# Patient Record
Sex: Female | Born: 1970 | Race: White | Hispanic: No | Marital: Married | Smoking: Former smoker
Health system: Southern US, Community
[De-identification: ages and names within clinical notes are randomized; demographics above are authoritative.]

## PROBLEM LIST (undated history)

## (undated) DIAGNOSIS — N946 Dysmenorrhea, unspecified: Secondary | ICD-10-CM

## (undated) DIAGNOSIS — I10 Essential (primary) hypertension: Secondary | ICD-10-CM

## (undated) DIAGNOSIS — N939 Abnormal uterine and vaginal bleeding, unspecified: Secondary | ICD-10-CM

## (undated) DIAGNOSIS — N809 Endometriosis, unspecified: Secondary | ICD-10-CM

## (undated) DIAGNOSIS — D219 Benign neoplasm of connective and other soft tissue, unspecified: Secondary | ICD-10-CM

## (undated) DIAGNOSIS — E119 Type 2 diabetes mellitus without complications: Secondary | ICD-10-CM

## (undated) DIAGNOSIS — K219 Gastro-esophageal reflux disease without esophagitis: Secondary | ICD-10-CM

## (undated) DIAGNOSIS — D649 Anemia, unspecified: Secondary | ICD-10-CM

## (undated) DIAGNOSIS — A630 Anogenital (venereal) warts: Secondary | ICD-10-CM

## (undated) DIAGNOSIS — M5410 Radiculopathy, site unspecified: Secondary | ICD-10-CM

## (undated) DIAGNOSIS — M069 Rheumatoid arthritis, unspecified: Secondary | ICD-10-CM

## (undated) DIAGNOSIS — T8859XA Other complications of anesthesia, initial encounter: Secondary | ICD-10-CM

## (undated) DIAGNOSIS — Z5189 Encounter for other specified aftercare: Secondary | ICD-10-CM

## (undated) DIAGNOSIS — R112 Nausea with vomiting, unspecified: Secondary | ICD-10-CM

## (undated) DIAGNOSIS — Z9889 Other specified postprocedural states: Secondary | ICD-10-CM

## (undated) HISTORY — DX: Anogenital (venereal) warts: A63.0

## (undated) HISTORY — DX: Encounter for other specified aftercare: Z51.89

## (undated) HISTORY — DX: Radiculopathy, site unspecified: M54.10

## (undated) HISTORY — DX: Benign neoplasm of connective and other soft tissue, unspecified: D21.9

## (undated) HISTORY — DX: Rheumatoid arthritis, unspecified: M06.9

## (undated) HISTORY — DX: Dysmenorrhea, unspecified: N94.6

## (undated) HISTORY — DX: Endometriosis, unspecified: N80.9

## (undated) HISTORY — PX: PARATHYROIDECTOMY: SHX19

## (undated) HISTORY — DX: Abnormal uterine and vaginal bleeding, unspecified: N93.9

## (undated) HISTORY — DX: Type 2 diabetes mellitus without complications: E11.9

## (undated) HISTORY — DX: Anemia, unspecified: D64.9

## (undated) HISTORY — PX: ABDOMINAL HYSTERECTOMY: SHX81

---

## 2020-09-19 ENCOUNTER — Other Ambulatory Visit: Payer: Self-pay | Admitting: Physician Assistant

## 2020-09-19 ENCOUNTER — Other Ambulatory Visit: Payer: Self-pay

## 2020-09-19 ENCOUNTER — Ambulatory Visit
Admission: RE | Admit: 2020-09-19 | Discharge: 2020-09-19 | Disposition: A | Discharge status: Home / Self Care | Payer: Medicare Other | Source: Ambulatory Visit | Attending: Physician Assistant | Admitting: Physician Assistant

## 2020-09-19 DIAGNOSIS — M25562 Pain in left knee: Secondary | ICD-10-CM | POA: Diagnosis not present

## 2020-09-19 DIAGNOSIS — M47817 Spondylosis without myelopathy or radiculopathy, lumbosacral region: Secondary | ICD-10-CM

## 2020-09-19 DIAGNOSIS — Z79891 Long term (current) use of opiate analgesic: Secondary | ICD-10-CM | POA: Diagnosis not present

## 2020-09-19 DIAGNOSIS — M25561 Pain in right knee: Secondary | ICD-10-CM

## 2020-09-19 DIAGNOSIS — M1710 Unilateral primary osteoarthritis, unspecified knee: Secondary | ICD-10-CM | POA: Diagnosis not present

## 2020-09-19 DIAGNOSIS — G894 Chronic pain syndrome: Secondary | ICD-10-CM | POA: Diagnosis not present

## 2020-09-19 DIAGNOSIS — M545 Low back pain, unspecified: Secondary | ICD-10-CM | POA: Diagnosis not present

## 2020-09-19 DIAGNOSIS — Z79899 Other long term (current) drug therapy: Secondary | ICD-10-CM | POA: Diagnosis not present

## 2020-09-19 DIAGNOSIS — G629 Polyneuropathy, unspecified: Secondary | ICD-10-CM | POA: Diagnosis not present

## 2020-09-26 ENCOUNTER — Other Ambulatory Visit: Payer: Self-pay

## 2020-09-26 ENCOUNTER — Encounter: Payer: Self-pay | Admitting: Internal Medicine

## 2020-09-26 ENCOUNTER — Ambulatory Visit (INDEPENDENT_AMBULATORY_CARE_PROVIDER_SITE_OTHER): Payer: Medicare Other | Admitting: Internal Medicine

## 2020-09-26 VITALS — BP 127/74 | HR 79 | Temp 98.2°F | Ht 64.76 in | Wt 380.8 lb

## 2020-09-26 DIAGNOSIS — M1990 Unspecified osteoarthritis, unspecified site: Secondary | ICD-10-CM | POA: Diagnosis not present

## 2020-09-26 DIAGNOSIS — E669 Obesity, unspecified: Secondary | ICD-10-CM

## 2020-09-26 DIAGNOSIS — R8271 Bacteriuria: Secondary | ICD-10-CM

## 2020-09-26 DIAGNOSIS — I890 Lymphedema, not elsewhere classified: Secondary | ICD-10-CM

## 2020-09-26 DIAGNOSIS — G629 Polyneuropathy, unspecified: Secondary | ICD-10-CM

## 2020-09-26 DIAGNOSIS — E1159 Type 2 diabetes mellitus with other circulatory complications: Secondary | ICD-10-CM | POA: Insufficient documentation

## 2020-09-26 DIAGNOSIS — I10 Essential (primary) hypertension: Secondary | ICD-10-CM | POA: Insufficient documentation

## 2020-09-26 DIAGNOSIS — M069 Rheumatoid arthritis, unspecified: Secondary | ICD-10-CM

## 2020-09-26 DIAGNOSIS — M7989 Other specified soft tissue disorders: Secondary | ICD-10-CM

## 2020-09-26 DIAGNOSIS — M549 Dorsalgia, unspecified: Secondary | ICD-10-CM

## 2020-09-26 LAB — URINALYSIS, ROUTINE W REFLEX MICROSCOPIC
Bilirubin, UA: NEGATIVE
Glucose, UA: NEGATIVE
Ketones, UA: NEGATIVE
Nitrite, UA: POSITIVE — AB
RBC, UA: NEGATIVE
Specific Gravity, UA: 1.025 (ref 1.005–1.030)
Urobilinogen, Ur: 0.2 mg/dL (ref 0.2–1.0)
pH, UA: 6.5 (ref 5.0–7.5)

## 2020-09-26 LAB — MICROSCOPIC EXAMINATION: RBC, Urine: NONE SEEN /hpf (ref 0–2)

## 2020-09-26 LAB — BAYER DCA HB A1C WAIVED: HB A1C (BAYER DCA - WAIVED): 6.5 % — ABNORMAL HIGH (ref 4.8–5.6)

## 2020-09-26 MED ORDER — METOPROLOL SUCCINATE ER 25 MG PO TB24: 25.0000 mg | ORAL_TABLET | Freq: Every day | ORAL | 3 refills | Status: DC

## 2020-09-26 MED ORDER — SULFAMETHOXAZOLE-TRIMETHOPRIM 800-160 MG PO TABS: 1.0000 | ORAL_TABLET | Freq: Two times a day (BID) | ORAL | 0 refills | Status: AC

## 2020-09-26 MED ORDER — HYDROCHLOROTHIAZIDE 25 MG PO TABS: 25.0000 mg | ORAL_TABLET | Freq: Every day | ORAL | 3 refills | Status: DC

## 2020-09-26 NOTE — Progress Notes (Signed)
BP 127/74   Pulse 79   Temp 98.2 F (36.8 C) (Oral)   Ht 5' 4.76" (1.645 m)   Wt (!) 380 lb 12.8 oz (172.7 kg)   SpO2 96%   BMI 63.83 kg/m    Subjective:    [name], female    DOB: 1970-01-30, 50 y.o.   MRN: [id number]  Chief Complaint  Patient presents with   New Patient (Initial Visit)    RA, OA, DDD, HTN, Neuropathy b/l leg, lymphedema, hands are falling asleep.  Moved from [place].    HPI: Elizabeth Patrick is a 50 y.o. female  Pt moved from [place] x 2 weeks ago has a ho RA on infusions sees Pain mx @  [facility] for RA /DDD is on gabapentin and muscle relaxants - tizanidine.  OA in bil knees and right shoulder and Hip and lower back- has had nerve ablation x 3 months ago and cortisone injections in bil knees. Has neuropathy sec to meds / OA Has lymphedema in lower ext.   Per pt pain mx physician wondered if she could be on ozempic.  Pt says she has fatty liver and has had a liver bx in [place].    Chief Complaint  Patient presents with   New Patient (Initial Visit)    RA, OA, DDD, HTN, Neuropathy b/l leg, lymphedema, hands are falling asleep.  Moved from [place].    Relevant past medical, surgical, family and social history reviewed and updated as indicated. Interim medical history since our last visit reviewed. Allergies and medications reviewed and updated.  Review of Systems  Constitutional:  Negative for activity change, appetite change, chills, fatigue and fever.  HENT:  Negative for congestion.   Eyes:  Negative for visual disturbance.  Respiratory:  Negative for apnea, cough, chest tightness, shortness of breath and wheezing.   Cardiovascular:  Negative for chest pain, palpitations and leg swelling.  Gastrointestinal:  Negative for abdominal distention, abdominal pain, anal bleeding, blood in stool, constipation, diarrhea, nausea, rectal pain and vomiting.  Endocrine: Negative for cold intolerance, heat intolerance, polydipsia, polyphagia  and polyuria.  Genitourinary:  Positive for frequency. Negative for difficulty urinating, hematuria and urgency.  Musculoskeletal:  Positive for arthralgias, back pain and joint swelling.  Skin:  Negative for color change and rash.  Neurological:  Negative for dizziness, speech difficulty, weakness, light-headedness, numbness and headaches.  Psychiatric/Behavioral:  Negative for behavioral problems and confusion. The patient is not nervous/anxious.    Per HPI unless specifically indicated above     Objective:    BP 127/74   Pulse 79   Temp 98.2 F (36.8 C) (Oral)   Ht 5' 4.76" (1.645 m)   Wt (!) 380 lb 12.8 oz (172.7 kg)   SpO2 96%   BMI 63.83 kg/m   Wt Readings from Last 3 Encounters:  09/26/20 (!) 380 lb 12.8 oz (172.7 kg)    Physical Exam Vitals and nursing note reviewed.  Constitutional:      General: She is not in acute distress.    Appearance: Normal appearance. She is not ill-appearing or diaphoretic.  Eyes:     Conjunctiva/sclera: Conjunctivae normal.  Cardiovascular:     Rate and Rhythm: Normal rate and regular rhythm.  Pulmonary:     Breath sounds: No rhonchi.  Abdominal:     General: Abdomen is flat. Bowel sounds are normal. There is no distension.     Palpations: Abdomen is soft. There is no mass.  Tenderness: There is no abdominal tenderness. There is no guarding.  Musculoskeletal:        General: Swelling present. No tenderness, deformity or signs of injury.     Right lower leg: Edema present.     Left lower leg: Edema present.  Skin:    General: Skin is warm and dry.     Coloration: Skin is not jaundiced.     Findings: No erythema.  Neurological:     Mental Status: She is alert.     Cranial Nerves: No cranial nerve deficit.     Sensory: No sensory deficit.     Motor: No weakness.     Coordination: Coordination normal.     Gait: Gait normal.     Deep Tendon Reflexes: Reflexes normal.    No results found for this or any previous visit.       Current Outpatient Medications:    AMLODIPINE BENZOATE PO, Take 10 mg by mouth., Disp: , Rfl:    Cholecalciferol (D3 2000 PO), Take 2 tablets by mouth., Disp: , Rfl:    esomeprazole (NEXIUM) 40 MG capsule, Take 40 mg by mouth daily at 12 noon., Disp: , Rfl:    folic acid (FOLVITE) 1 MG tablet, Take 1 mg by mouth daily., Disp: , Rfl:    gabapentin (NEURONTIN) 800 MG tablet, Take 800 mg by mouth 3 (three) times daily., Disp: , Rfl:    ibuprofen (ADVIL) 600 MG tablet, Take 600 mg by mouth every 6 (six) hours as needed., Disp: , Rfl:    Lidocaine (BLUE-EMU PAIN RELIEF DRY EX), Apply topically., Disp: , Rfl:    losartan (COZAAR) 100 MG tablet, Take 100 mg by mouth daily., Disp: , Rfl:    SUPER B COMPLEX/C PO, Take by mouth., Disp: , Rfl:    tiZANidine (ZANAFLEX) 2 MG tablet, Take by mouth every 6 (six) hours as needed for muscle spasms., Disp: , Rfl:    Tocilizumab (ACTEMRA IV), Inject into the vein., Disp: , Rfl:     Assessment & Plan:  RA: For over 5 years has been on infusions per rheumatology follow-up and management per them Actemra q 4 weeks infusion over an hour @ Rheum x 5 yrs now. Seeing them in [place] for such   2. HTN : is on amlodipine ,losartan for such  Continue current meds.  Medication compliance emphasised. pt advised to keep Bp logs. Pt verbalised understanding of the same. Pt to have a low salt diet . Exercise to reach a goal of at least 150 mins a week.  lifestyle modifications explained and pt understands importance of the above.  3. Swelling in lower ext seen in Lymphedema clinic. Cannot wear stocking Will refer to cards ?> needs ECHo and stress test given RA is a high risk for CAD  4. Obesity  ? Ozempic if prediabetes.  To start swim aerobics  5. Neuropathy x  2 yrs, in bil hands and bil feet  Is on gabapentin 800 qid now for such  Gets shooting pains from toes  ?  Needs an MRI mom has MS and is in a NH from severe MS  now.  Problem List Items Addressed  This Visit   None    Orders Placed This Encounter  Procedures   CBC with Differential/Platelet   Comprehensive metabolic panel   Lipid panel   TSH   Urinalysis, Routine w reflex microscopic   Bayer DCA Hb A1c Waived   Ambulatory referral to Cardiology   Ambulatory  referral to Orthopedics     No orders of the defined types were placed in this encounter.    Follow up plan: No follow-ups on file.

## 2020-09-27 DIAGNOSIS — M0609 Rheumatoid arthritis without rheumatoid factor, multiple sites: Secondary | ICD-10-CM | POA: Diagnosis not present

## 2020-09-27 DIAGNOSIS — R5383 Other fatigue: Secondary | ICD-10-CM | POA: Diagnosis not present

## 2020-09-27 DIAGNOSIS — M5136 Other intervertebral disc degeneration, lumbar region: Secondary | ICD-10-CM | POA: Diagnosis not present

## 2020-09-27 DIAGNOSIS — G894 Chronic pain syndrome: Secondary | ICD-10-CM | POA: Diagnosis not present

## 2020-09-27 DIAGNOSIS — M150 Primary generalized (osteo)arthritis: Secondary | ICD-10-CM | POA: Diagnosis not present

## 2020-09-27 LAB — CBC WITH DIFFERENTIAL/PLATELET
Basophils Absolute: 0 10*3/uL (ref 0.0–0.2)
Basos: 0 %
EOS (ABSOLUTE): 0.2 10*3/uL (ref 0.0–0.4)
Eos: 2 %
Hematocrit: 46.4 % (ref 34.0–46.6)
Hemoglobin: 16 g/dL — ABNORMAL HIGH (ref 11.1–15.9)
Immature Grans (Abs): 0 10*3/uL (ref 0.0–0.1)
Immature Granulocytes: 0 %
Lymphocytes Absolute: 2.7 10*3/uL (ref 0.7–3.1)
Lymphs: 33 %
MCH: 32.1 pg (ref 26.6–33.0)
MCHC: 34.5 g/dL (ref 31.5–35.7)
MCV: 93 fL (ref 79–97)
Monocytes Absolute: 0.9 10*3/uL (ref 0.1–0.9)
Monocytes: 10 %
Neutrophils Absolute: 4.6 10*3/uL (ref 1.4–7.0)
Neutrophils: 55 %
Platelets: 223 10*3/uL (ref 150–450)
RBC: 4.99 x10E6/uL (ref 3.77–5.28)
RDW: 12.8 % (ref 11.7–15.4)
WBC: 8.3 10*3/uL (ref 3.4–10.8)

## 2020-09-27 LAB — LIPID PANEL
Chol/HDL Ratio: 4.5 ratio — ABNORMAL HIGH (ref 0.0–4.4)
Cholesterol, Total: 253 mg/dL — ABNORMAL HIGH (ref 100–199)
HDL: 56 mg/dL (ref >39)
LDL Chol Calc (NIH): 131 mg/dL — ABNORMAL HIGH (ref 0–99)
Triglycerides: 368 mg/dL — ABNORMAL HIGH (ref 0–149)
VLDL Cholesterol Cal: 66 mg/dL — ABNORMAL HIGH (ref 5–40)

## 2020-09-27 LAB — COMPREHENSIVE METABOLIC PANEL
ALT: 81 IU/L — ABNORMAL HIGH (ref 0–32)
AST: 53 IU/L — ABNORMAL HIGH (ref 0–40)
Albumin/Globulin Ratio: 2.5 — ABNORMAL HIGH (ref 1.2–2.2)
Albumin: 4.8 g/dL (ref 3.8–4.9)
Alkaline Phosphatase: 60 IU/L (ref 44–121)
BUN/Creatinine Ratio: 21 (ref 9–23)
BUN: 14 mg/dL (ref 6–24)
Bilirubin Total: 0.7 mg/dL (ref 0.0–1.2)
CO2: 28 mmol/L (ref 20–29)
Calcium: 10.3 mg/dL — ABNORMAL HIGH (ref 8.7–10.2)
Chloride: 97 mmol/L (ref 96–106)
Creatinine, Ser: 0.68 mg/dL (ref 0.57–1.00)
Globulin, Total: 1.9 g/dL (ref 1.5–4.5)
Glucose: 155 mg/dL — ABNORMAL HIGH (ref 65–99)
Potassium: 3.4 mmol/L — ABNORMAL LOW (ref 3.5–5.2)
Sodium: 140 mmol/L (ref 134–144)
Total Protein: 6.7 g/dL (ref 6.0–8.5)
eGFR: 105 mL/min/{1.73_m2} (ref >59)

## 2020-09-27 LAB — TSH: TSH: 1.57 u[IU]/mL (ref 0.450–4.500)

## 2020-09-28 MED ORDER — ATORVASTATIN CALCIUM 10 MG PO TABS: 10.0000 mg | ORAL_TABLET | Freq: Every day | ORAL | 3 refills | Status: DC

## 2020-09-28 MED ORDER — FENOFIBRATE 145 MG PO TABS: 145.0000 mg | ORAL_TABLET | Freq: Every day | ORAL | 4 refills | Status: DC

## 2020-09-28 MED ORDER — OZEMPIC (0.25 OR 0.5 MG/DOSE) 2 MG/1.5ML ~~LOC~~ SOPN: 0.2500 mg | PEN_INJECTOR | SUBCUTANEOUS | 3 refills | Status: DC

## 2020-09-28 NOTE — Addendum Note (Signed)
Addended by: Charlynne Cousins on: 09/28/2020 09:09 AM   Modules accepted: Orders

## 2020-09-28 NOTE — Progress Notes (Signed)
Called pt no answer. Please let her know , a1c high at 6.5 will send off ozempic for her to tx this as well as for her request for weight loss meds. Will need to start lipitor sec to elevated LDL and TG levels, to fu with me as scheudled.  Thnx.

## 2020-09-29 ENCOUNTER — Ambulatory Visit: Payer: Self-pay

## 2020-09-29 NOTE — Telephone Encounter (Signed)
Pt called stating that she was given 3 new medications to take. She states that she is not sure why she is taking them and is requesting to have a nurse give her a call back to discuss. Please advise.    Pt. Reports Ozempic and "2 cholesterol medicines were sent to my pharmacy." Requests to get lab results. PEC not authorized to release. Please advise pt.

## 2020-09-29 NOTE — Telephone Encounter (Signed)
Called and provided lab results to the patient per Dr. Levada Dy result note.

## 2020-10-01 LAB — URINE CULTURE

## 2020-10-02 NOTE — Progress Notes (Signed)
Pt has a UTI - pl let her know. Thnx. Will send of nitrofurantoin.

## 2020-10-03 ENCOUNTER — Telehealth: Payer: Self-pay

## 2020-10-03 NOTE — Chronic Care Management (AMB) (Signed)
  Chronic Care Management   Outreach Note  10/03/2020 Name: Elizabeth Patrick MRN: [id number] DOB: September 29, 1970  Elizabeth Patrick is a 50 y.o. year old female who is a primary care patient of Vigg, [name], MD. I reached out to [employer] by phone today in response to a referral sent by [name]'s PCP, Charlynne Cousins, MD      A second unsuccessful telephone outreach was attempted today. The patient was referred to the case management team for assistance with care management and care coordination.   Follow Up Plan: A HIPAA compliant phone message was left for the patient providing contact information and requesting a return call.  The care management team will reach out to the patient again over the next 5 days.  If patient returns call to provider office, please advise to call [facility] [place], [address], [place], [place] [address] Direct Dial: [phone] [email or web address]

## 2020-10-06 NOTE — Chronic Care Management (AMB) (Signed)
  Chronic Care Management   Note  10/06/2020 Name: Elizabeth Patrick MRN: [id number] DOB: Oct 29, 1970  Elizabeth Patrick is a 50 y.o. year old female who is a primary care patient of Vigg, [name], MD. I reached out to [employer] by phone today in response to a referral sent by Elizabeth Patrick's PCP.  Elizabeth Patrick was given information about Chronic Care Management services today including:  CCM service includes personalized support from designated clinical staff supervised by her physician, including individualized plan of care and coordination with other care providers 24/7 contact phone numbers for assistance for urgent and routine care needs. Service will only be billed when office clinical staff spend 20 minutes or more in a month to coordinate care. Only one practitioner may furnish and bill the service in a calendar month. The patient may stop CCM services at any time (effective at the end of the month) by phone call to the office staff. The patient is responsible for co-pay (up to 20% after annual deductible is met) if co-pay is required by the individual health plan.   Patient agreed to services and verbal consent obtained.   Follow up plan: Telephone appointment with care management team member scheduled for: LCSW 10/13/2020 RN CM 11/08/2020  Elizabeth Patrick, Elizabeth Patrick, Elizabeth Patrick, Elizabeth Patrick Direct Dial: [phone] [email or web address]: [email or web address]

## 2020-10-10 ENCOUNTER — Encounter: Payer: Self-pay | Admitting: Internal Medicine

## 2020-10-10 ENCOUNTER — Ambulatory Visit (INDEPENDENT_AMBULATORY_CARE_PROVIDER_SITE_OTHER): Payer: Medicare Other | Admitting: Internal Medicine

## 2020-10-10 ENCOUNTER — Other Ambulatory Visit: Payer: Self-pay

## 2020-10-10 VITALS — BP 137/75 | HR 73 | Temp 98.6°F | Ht 64.96 in | Wt 378.2 lb

## 2020-10-10 DIAGNOSIS — M069 Rheumatoid arthritis, unspecified: Secondary | ICD-10-CM | POA: Diagnosis not present

## 2020-10-10 DIAGNOSIS — Z1211 Encounter for screening for malignant neoplasm of colon: Secondary | ICD-10-CM | POA: Insufficient documentation

## 2020-10-10 DIAGNOSIS — Z1239 Encounter for other screening for malignant neoplasm of breast: Secondary | ICD-10-CM | POA: Insufficient documentation

## 2020-10-10 DIAGNOSIS — I739 Peripheral vascular disease, unspecified: Secondary | ICD-10-CM

## 2020-10-10 DIAGNOSIS — E785 Hyperlipidemia, unspecified: Secondary | ICD-10-CM

## 2020-10-10 DIAGNOSIS — Z130 Encounter for screening for diseases of the blood and blood-forming organs and certain disorders involving the immune mechanism: Secondary | ICD-10-CM | POA: Insufficient documentation

## 2020-10-10 DIAGNOSIS — I890 Lymphedema, not elsewhere classified: Secondary | ICD-10-CM

## 2020-10-10 DIAGNOSIS — K7581 Nonalcoholic steatohepatitis (NASH): Secondary | ICD-10-CM

## 2020-10-10 DIAGNOSIS — G629 Polyneuropathy, unspecified: Secondary | ICD-10-CM

## 2020-10-10 NOTE — Patient Instructions (Signed)
Results for Elizabeth Patrick, Elizabeth Patrick (MRN [id number]) as of 10/10/2020 11:37  Ref. Range 09/26/2020 14:47  COMPREHENSIVE METABOLIC PANEL Unknown Rpt (A)  Sodium Latest Ref Range: 134 - 144 mmol/L 140  Potassium Latest Ref Range: 3.5 - 5.2 mmol/L 3.4 (L)  Chloride Latest Ref Range: 96 - 106 mmol/L 97  CO2 Latest Ref Range: 20 - 29 mmol/L 28  Glucose Latest Ref Range: 65 - 99 mg/dL 155 (H)  BUN Latest Ref Range: 6 - 24 mg/dL 14  Creatinine Latest Ref Range: 0.57 - 1.00 mg/dL 0.68  Calcium Latest Ref Range: 8.7 - 10.2 mg/dL 10.3 (H)  BUN/Creatinine Ratio Latest Ref Range: 9 - 23  21  eGFR Latest Ref Range: >59 mL/min/1.73 105  Alkaline Phosphatase Latest Ref Range: 44 - 121 IU/L 60  Albumin Latest Ref Range: 3.8 - 4.9 g/dL 4.8  Albumin/Globulin Ratio Latest Ref Range: 1.2 - 2.2  2.5 (H)  AST Latest Ref Range: 0 - 40 IU/L 53 (H)  ALT Latest Ref Range: 0 - 32 IU/L 81 (H)  Total Protein Latest Ref Range: 6.0 - 8.5 g/dL 6.7  Total Bilirubin Latest Ref Range: 0.0 - 1.2 mg/dL 0.7  Total CHOL/HDL Ratio Latest Ref Range: 0.0 - 4.4 ratio 4.5 (H)  Cholesterol, Total Latest Ref Range: 100 - 199 mg/dL 253 (H)  HDL Cholesterol Latest Ref Range: >39 mg/dL 56  Triglycerides Latest Ref Range: 0 - 149 mg/dL 368 (H)  VLDL Cholesterol Cal Latest Ref Range: 5 - 40 mg/dL 66 (H)  LDL Chol Calc (NIH) Latest Ref Range: 0 - 99 mg/dL 131 (H)  Globulin, Total Latest Ref Range: 1.5 - 4.5 g/dL 1.9  WBC Latest Ref Range: 3.4 - 10.8 x10E3/uL 8.3  RBC Latest Ref Range: 3.77 - 5.28 x10E6/uL 4.99  Hemoglobin Latest Ref Range: 11.1 - 15.9 g/dL 16.0 (H)  HCT Latest Ref Range: 34.0 - 46.6 % 46.4  MCV Latest Ref Range: 79 - 97 fL 93  MCH Latest Ref Range: 26.6 - 33.0 pg 32.1  MCHC Latest Ref Range: 31.5 - 35.7 g/dL 34.5  RDW Latest Ref Range: 11.7 - 15.4 % 12.8  Platelets Latest Ref Range: 150 - 450 x10E3/uL 223  Neutrophils Latest Ref Range: Not Estab. % 55  Immature Granulocytes Latest Ref Range: Not Estab. % 0  NEUT# Latest Ref  Range: 1.4 - 7.0 x10E3/uL 4.6  Lymphocyte # Latest Ref Range: 0.7 - 3.1 x10E3/uL 2.7  Monocytes Absolute Latest Ref Range: 0.1 - 0.9 x10E3/uL 0.9  Basophils Absolute Latest Ref Range: 0.0 - 0.2 x10E3/uL 0.0  Immature Grans (Abs) Latest Ref Range: 0.0 - 0.1 x10E3/uL 0.0  Lymphs Latest Ref Range: Not Estab. % 33  Monocytes Latest Ref Range: Not Estab. % 10  Basos Latest Ref Range: Not Estab. % 0  Eos Latest Ref Range: Not Estab. % 2  EOS (ABSOLUTE) Latest Ref Range: 0.0 - 0.4 x10E3/uL 0.2  TSH Latest Ref Range: 0.450 - 4.500 uIU/mL 1.570

## 2020-10-10 NOTE — Progress Notes (Signed)
BP 137/75   Pulse 73   Temp 98.6 F (37 C) (Oral)   Ht 5' 4.96" (1.65 m)   Wt (!) 378 lb 3.2 oz (171.6 kg)   SpO2 97%   BMI 63.01 kg/m    Subjective:    [name], female    DOB: 1970-12-10, 50 y.o.   MRN: [id number]  Chief Complaint  Patient presents with   Rheumatoid Arthritis    Patient would like to go over all the new medications that was started 2 weeks ago    HPI: Elizabeth Patrick is a 50 y.o. female  Pt is here for a follow up .  She has had abnl labs.  Lower ext swelling worse.  Left leg hurts and has cramping in her lower ext stops after 15 feet of walking.  See [place] mx and to get shots in knees.   Arthritis Presents for follow-up (has RA is on infusions for such which have caused NASH sees rheum) visit. She reports no joint swelling.  Hyperlipidemia This is a chronic problem. The problem is controlled. The current treatment provides moderate improvement of lipids.   Chief Complaint  Patient presents with   Rheumatoid Arthritis    Patient would like to go over all the new medications that was started 2 weeks ago    Relevant past medical, surgical, family and social history reviewed and updated as indicated. Interim medical history since our last visit reviewed. Allergies and medications reviewed and updated.  Review of Systems  Musculoskeletal:  Positive for arthritis. Negative for joint swelling.   Per HPI unless specifically indicated above     Objective:    BP 137/75   Pulse 73   Temp 98.6 F (37 C) (Oral)   Ht 5' 4.96" (1.65 m)   Wt (!) 378 lb 3.2 oz (171.6 kg)   SpO2 97%   BMI 63.01 kg/m   Wt Readings from Last 3 Encounters:  10/10/20 (!) 378 lb 3.2 oz (171.6 kg)  09/26/20 (!) 380 lb 12.8 oz (172.7 kg)    Physical Exam Vitals and nursing note reviewed.  Constitutional:      General: She is not in acute distress.    Appearance: Normal appearance. She is obese. She is not ill-appearing or diaphoretic.  Eyes:      Conjunctiva/sclera: Conjunctivae normal.  Cardiovascular:     Rate and Rhythm: Normal rate.     Heart sounds: No murmur heard.   No gallop.  Pulmonary:     Effort: Pulmonary effort is normal.     Breath sounds: Normal breath sounds. No rhonchi.  Abdominal:     General: Abdomen is flat. Bowel sounds are normal. There is no distension.     Palpations: Abdomen is soft. There is no mass.     Tenderness: There is no abdominal tenderness. There is no guarding.  Skin:    General: Skin is warm and dry.     Coloration: Skin is not jaundiced.     Findings: No erythema.  Neurological:     General: No focal deficit present.     Mental Status: She is alert and oriented to person, place, and time. Mental status is at baseline.  Psychiatric:        Mood and Affect: Mood normal.        Behavior: Behavior normal.    Results for orders placed or performed in visit on 09/26/20  Urine Culture   Specimen: Urine   UR  Result  Value Ref Range   Urine Culture, Routine Final report (A)    Organism ID, Bacteria Escherichia coli (A)    Antimicrobial Susceptibility Comment   Microscopic Examination   Urine  Result Value Ref Range   WBC, UA 0-5 0 - 5 /hpf   RBC None seen 0 - 2 /hpf   Epithelial Cells (non renal) 0-10 0 - 10 /hpf   Mucus, UA Present (A) Not Estab.   Bacteria, UA Many (A) None seen/Few  CBC with Differential/Platelet  Result Value Ref Range   WBC 8.3 3.4 - 10.8 x10E3/uL   RBC 4.99 3.77 - 5.28 x10E6/uL   Hemoglobin 16.0 (H) 11.1 - 15.9 g/dL   Hematocrit 46.4 34.0 - 46.6 %   MCV 93 79 - 97 fL   MCH 32.1 26.6 - 33.0 pg   MCHC 34.5 31.5 - 35.7 g/dL   RDW 12.8 11.7 - 15.4 %   Platelets 223 150 - 450 x10E3/uL   Neutrophils 55 Not Estab. %   Lymphs 33 Not Estab. %   Monocytes 10 Not Estab. %   Eos 2 Not Estab. %   Basos 0 Not Estab. %   Neutrophils Absolute 4.6 1.4 - 7.0 x10E3/uL   Lymphocytes Absolute 2.7 0.7 - 3.1 x10E3/uL   Monocytes Absolute 0.9 0.1 - 0.9 x10E3/uL   EOS  (ABSOLUTE) 0.2 0.0 - 0.4 x10E3/uL   Basophils Absolute 0.0 0.0 - 0.2 x10E3/uL   Immature Granulocytes 0 Not Estab. %   Immature Grans (Abs) 0.0 0.0 - 0.1 x10E3/uL  Comprehensive metabolic panel  Result Value Ref Range   Glucose 155 (H) 65 - 99 mg/dL   BUN 14 6 - 24 mg/dL   Creatinine, Ser 0.68 0.57 - 1.00 mg/dL   eGFR 105 >59 mL/min/1.73   BUN/Creatinine Ratio 21 9 - 23   Sodium 140 134 - 144 mmol/L   Potassium 3.4 (L) 3.5 - 5.2 mmol/L   Chloride 97 96 - 106 mmol/L   CO2 28 20 - 29 mmol/L   Calcium 10.3 (H) 8.7 - 10.2 mg/dL   Total Protein 6.7 6.0 - 8.5 g/dL   Albumin 4.8 3.8 - 4.9 g/dL   Globulin, Total 1.9 1.5 - 4.5 g/dL   Albumin/Globulin Ratio 2.5 (H) 1.2 - 2.2   Bilirubin Total 0.7 0.0 - 1.2 mg/dL   Alkaline Phosphatase 60 44 - 121 IU/L   AST 53 (H) 0 - 40 IU/L   ALT 81 (H) 0 - 32 IU/L  Lipid panel  Result Value Ref Range   Cholesterol, Total 253 (H) 100 - 199 mg/dL   Triglycerides 368 (H) 0 - 149 mg/dL   HDL 56 >39 mg/dL   VLDL Cholesterol Cal 66 (H) 5 - 40 mg/dL   LDL Chol Calc (NIH) 131 (H) 0 - 99 mg/dL   Chol/HDL Ratio 4.5 (H) 0.0 - 4.4 ratio  TSH  Result Value Ref Range   TSH 1.570 0.450 - 4.500 uIU/mL  Urinalysis, Routine w reflex microscopic  Result Value Ref Range   Specific Gravity, UA 1.025 1.005 - 1.030   pH, UA 6.5 5.0 - 7.5   Color, UA Yellow Yellow   Appearance Ur Cloudy (A) Clear   Leukocytes,UA 1+ (A) Negative   Protein,UA Trace (A) Negative/Trace   Glucose, UA Negative Negative   Ketones, UA Negative Negative   RBC, UA Negative Negative   Bilirubin, UA Negative Negative   Urobilinogen, Ur 0.2 0.2 - 1.0 mg/dL   Nitrite, UA Positive (A) Negative  Microscopic Examination See below:   Bayer DCA Hb A1c Waived  Result Value Ref Range   HB A1C (BAYER DCA - WAIVED) 6.5 (H) 4.8 - 5.6 %        Current Outpatient Medications:    AMLODIPINE BENZOATE PO, Take 10 mg by mouth., Disp: , Rfl:    atorvastatin (LIPITOR) 10 MG tablet, Take 1 tablet (10 mg  total) by mouth daily., Disp: 90 tablet, Rfl: 3   Cholecalciferol (D3 2000 PO), Take 2 tablets by mouth., Disp: , Rfl:    esomeprazole (NEXIUM) 40 MG capsule, Take 40 mg by mouth daily at 12 noon., Disp: , Rfl:    fenofibrate (TRICOR) 145 MG tablet, Take 1 tablet (145 mg total) by mouth daily., Disp: 30 tablet, Rfl: 4   folic acid (FOLVITE) 1 MG tablet, Take 1 mg by mouth daily., Disp: , Rfl:    gabapentin (NEURONTIN) 800 MG tablet, Take 800 mg by mouth 3 (three) times daily., Disp: , Rfl:    ibuprofen (ADVIL) 600 MG tablet, Take 600 mg by mouth every 6 (six) hours as needed., Disp: , Rfl:    Lidocaine (BLUE-EMU PAIN RELIEF DRY EX), Apply topically., Disp: , Rfl:    losartan (COZAAR) 100 MG tablet, Take 100 mg by mouth daily., Disp: , Rfl:    metoprolol succinate (TOPROL-XL) 25 MG 24 hr tablet, Take 1 tablet (25 mg total) by mouth daily., Disp: 30 tablet, Rfl: 3   Semaglutide,0.25 or 0.5MG/DOS, (OZEMPIC, 0.25 OR 0.5 MG/DOSE,) 2 MG/1.5ML SOPN, Inject 0.25 mg into the skin once a week. Start with 0.25MG once a week x 4 weeks, then increase to 0.5MG weekly., Disp: 1.5 mL, Rfl: 3   SUPER B COMPLEX/C PO, Take by mouth., Disp: , Rfl:    tiZANidine (ZANAFLEX) 2 MG tablet, Take by mouth every 6 (six) hours as needed for muscle spasms., Disp: , Rfl:    Tocilizumab (ACTEMRA IV), Inject into the vein., Disp: , Rfl:     Assessment & Plan:  RA: For over 5 years has been on infusions per rheumatology follow-up and management per them Actemra q 4 weeks infusion over an hour @ Rheum x 5 yrs now. Seeing them in [place] for such    2. HTN : is on amlodipine ,losartan for such hctz was stopped sec to increased peeing with such. Swelling in lower ext worse. Will refer to Cards ? Needs an ECHo  Continue current meds.  Medication compliance emphasised. pt advised to keep Bp logs. Pt verbalised understanding of the same. Pt to have a low salt diet . Exercise to reach a goal of at least 150 mins a week.  lifestyle  modifications explained and pt understands importance of the above.   3. Swelling in lower ext seen in Lymphedema clinic. Cannot wear stocking Will refer to cards  giiven RA is a high risk for CAD   4. Obesity : a1c high at 6.5 was place don ozempic has  started cheer aerobics   5. Diabetes : Is on ozempic now for weight loss and new onset diabetes. check HbA1c,  urine  microalbumin  diabetic diet plan given to pt  adviced regarding hypoglycemia and instructions given to pt today on how to prevent and treat the same if it were to occur. pt acknowledges the plan and voices understanding of the same.  exercise plan given and encouraged.   advice diabetic yearly podiatry, ophthalmology , nutritionist , dental check q 6 months,  5. Neuropathy x  2 yrs, in bil hands and bil feet  Is on gabapentin 800 qid now for such  Gets shooting pains from toes  ?  Needs an MRI mom has MS and is in a NH from severe MS  now.   6.HLD was placed on lipitor 10 mg and fenofibrate Results for CHERICE, GLENNIE (MRN [id number]) as of 10/10/2020 11:37  Ref. Range 09/26/2020 14:47  Total CHOL/HDL Ratio Latest Ref Range: 0.0 - 4.4 ratio 4.5 (H)  Cholesterol, Total Latest Ref Range: 100 - 199 mg/dL 253 (H)  HDL Cholesterol Latest Ref Range: >39 mg/dL 56  Triglycerides Latest Ref Range: 0 - 149 mg/dL 368 (H)  VLDL Cholesterol Cal Latest Ref Range: 5 - 40 mg/dL 66 (H)  LDL Chol Calc (NIH) Latest Ref Range: 0 - 99 mg/dL 131 (H)    7. UTI :  had a ECOLI Uti s/p bactrim for such stbale now.    8. NASH x diagnosed x 1 yr ago. Sec to Actemra per pt  Fu and mx per rheum/ GI   Ref. Range 09/26/2020 14:47  AST Latest Ref Range: 0 - 40 IU/L 53 (H)  ALT Latest Ref Range: 0 - 32 IU/L 81 (H)   9/ PAD ? Check ABI  Claudications ++   Problem List Items Addressed This Visit   None    No orders of the defined types were placed in this encounter.    No orders of the defined types were placed in this encounter.    Follow  up plan: No follow-ups on file.  Health Maintenance : Mammogram Papsmear:hystectomy DEXA:  2 yrs ago Cscope : referred to GI Pneumonia vaccine : prenvanar / pneumovax   Labs next visit : CMP, FLP, HBA1C, urine microalbumin

## 2020-10-11 ENCOUNTER — Other Ambulatory Visit: Payer: Self-pay | Admitting: Internal Medicine

## 2020-10-11 ENCOUNTER — Encounter: Payer: Self-pay | Admitting: Internal Medicine

## 2020-10-11 DIAGNOSIS — E1142 Type 2 diabetes mellitus with diabetic polyneuropathy: Secondary | ICD-10-CM | POA: Insufficient documentation

## 2020-10-11 DIAGNOSIS — I739 Peripheral vascular disease, unspecified: Secondary | ICD-10-CM | POA: Insufficient documentation

## 2020-10-11 DIAGNOSIS — I10 Essential (primary) hypertension: Secondary | ICD-10-CM

## 2020-10-11 DIAGNOSIS — I890 Lymphedema, not elsewhere classified: Secondary | ICD-10-CM | POA: Insufficient documentation

## 2020-10-11 DIAGNOSIS — R600 Localized edema: Secondary | ICD-10-CM | POA: Insufficient documentation

## 2020-10-11 DIAGNOSIS — G629 Polyneuropathy, unspecified: Secondary | ICD-10-CM | POA: Insufficient documentation

## 2020-10-11 DIAGNOSIS — E1169 Type 2 diabetes mellitus with other specified complication: Secondary | ICD-10-CM | POA: Insufficient documentation

## 2020-10-11 DIAGNOSIS — E785 Hyperlipidemia, unspecified: Secondary | ICD-10-CM | POA: Insufficient documentation

## 2020-10-11 DIAGNOSIS — E669 Obesity, unspecified: Secondary | ICD-10-CM

## 2020-10-11 MED ORDER — LOSARTAN POTASSIUM 100 MG PO TABS: 100.0000 mg | ORAL_TABLET | Freq: Every day | ORAL | 4 refills | Status: DC

## 2020-10-11 NOTE — Telephone Encounter (Signed)
Medication Refill - Medication: losartan (COZAAR) 100 MG tablet. Patient also would like a Rx for a glaucous meter for her diabetes.   Has the patient contacted their pharmacy? Yes.   (Agent: If no, request that the patient contact the pharmacy for the refill.) (Agent: If yes, when and what did the pharmacy advise?)  Preferred Pharmacy (with phone number or street name): [id number] - [place], [place] Has the patient been seen for an appointment in the last year OR does the patient have an upcoming appointment? Yes.    Agent: Please be advised that RX refills may take up to 3 business days. We ask that you follow-up with your pharmacy.

## 2020-10-11 NOTE — Telephone Encounter (Signed)
Requested medication (s) are due for refill today: Yes  Requested medication (s) are on the active medication list: Yes  Last refill:  2 weeks ago  Future visit scheduled: Yes  Notes to clinic:  Unable to refill per protocol, last refill by another provider. Patient also requesting a glucose meter and testing supplies.      Requested Prescriptions  Pending Prescriptions Disp Refills   losartan (COZAAR) 100 MG tablet      Sig: Take 1 tablet (100 mg total) by mouth daily.     Cardiovascular:  Angiotensin Receptor Blockers Failed - 10/11/2020 11:31 AM      Failed - K in normal range and within 180 days    Potassium  Date Value Ref Range Status  09/26/2020 3.4 (L) 3.5 - 5.2 mmol/L Final          Passed - Cr in normal range and within 180 days    Creatinine, Ser  Date Value Ref Range Status  09/26/2020 0.68 0.57 - 1.00 mg/dL Final          Passed - Patient is not pregnant      Passed - Last BP in normal range    BP Readings from Last 1 Encounters:  10/10/20 137/75          Passed - Valid encounter within last 6 months    Recent Outpatient Visits           Yesterday NASH (nonalcoholic steatohepatitis)   [name] Vigg, [name], MD   2 weeks ago Rheumatoid arthritis, involving unspecified site, unspecified whether rheumatoid factor present ([place])   [place], [name], MD       Future Appointments             In 1 month Vigg, [name], MD [facility]

## 2020-10-13 ENCOUNTER — Telehealth: Payer: Medicare Other

## 2020-10-17 ENCOUNTER — Ambulatory Visit (INDEPENDENT_AMBULATORY_CARE_PROVIDER_SITE_OTHER): Payer: Medicare Other | Admitting: Internal Medicine

## 2020-10-17 ENCOUNTER — Other Ambulatory Visit: Payer: Self-pay

## 2020-10-17 ENCOUNTER — Encounter: Payer: Self-pay | Admitting: Internal Medicine

## 2020-10-17 VITALS — BP 126/86 | HR 85 | Temp 99.3°F | Ht 64.6 in | Wt 378.2 lb

## 2020-10-17 DIAGNOSIS — R300 Dysuria: Secondary | ICD-10-CM

## 2020-10-17 DIAGNOSIS — N390 Urinary tract infection, site not specified: Secondary | ICD-10-CM

## 2020-10-17 DIAGNOSIS — R32 Unspecified urinary incontinence: Secondary | ICD-10-CM | POA: Insufficient documentation

## 2020-10-17 DIAGNOSIS — N39498 Other specified urinary incontinence: Secondary | ICD-10-CM

## 2020-10-17 LAB — URINALYSIS, ROUTINE W REFLEX MICROSCOPIC: Specific Gravity, UA: 1.015 (ref 1.005–1.030)

## 2020-10-17 LAB — MICROSCOPIC EXAMINATION

## 2020-10-17 MED ORDER — CIPROFLOXACIN HCL 250 MG PO TABS: 250.0000 mg | ORAL_TABLET | Freq: Two times a day (BID) | ORAL | 0 refills | Status: DC

## 2020-10-17 MED ORDER — CIPROFLOXACIN HCL 250 MG PO TABS: 250.0000 mg | ORAL_TABLET | Freq: Two times a day (BID) | ORAL | 0 refills | Status: AC

## 2020-10-17 NOTE — Progress Notes (Signed)
BP 126/86   Pulse 85   Temp 99.3 F (37.4 C) (Oral)   Ht 5' 4.6" (1.641 m)   Wt (!) 378 lb 3.2 oz (171.6 kg)   SpO2 94%   BMI 63.72 kg/m    Subjective:    [name], female    DOB: 26-Nov-1970, 50 y.o.   MRN: [id number]  Chief Complaint  Patient presents with   Urinary Frequency    X 2 days. Dysuria, leaking urine. Lower back pain. Taking AZO to help, but not much relief.     HPI: Elizabeth Patrick is a 50 y.o. female  Pt is ehre for symtpoms of a uti Is immunocompromised sec to RA and infusions.   Has lost about 19 lbs since starting ozempic   Urinary Frequency  This is a recurrent (has had incontinence as well no fver) problem. The quality of the pain is described as burning. The pain is at a severity of 4/10. The pain is mild. There has been no fever. Associated symptoms include chills, frequency, urgency and vomiting. Pertinent negatives include no discharge, flank pain, hematuria, hesitancy, nausea, possible pregnancy or sweats. Her past medical history is significant for recurrent UTIs.   Chief Complaint  Patient presents with   Urinary Frequency    X 2 days. Dysuria, leaking urine. Lower back pain. Taking AZO to help, but not much relief.     Relevant past medical, surgical, family and social history reviewed and updated as indicated. Interim medical history since our last visit reviewed. Allergies and medications reviewed and updated.  Review of Systems  Constitutional:  Positive for chills.  Gastrointestinal:  Positive for vomiting. Negative for nausea.  Genitourinary:  Positive for frequency and urgency. Negative for flank pain, hematuria and hesitancy.   Per HPI unless specifically indicated above     Objective:    BP 126/86   Pulse 85   Temp 99.3 F (37.4 C) (Oral)   Ht 5' 4.6" (1.641 m)   Wt (!) 378 lb 3.2 oz (171.6 kg)   SpO2 94%   BMI 63.72 kg/m   Wt Readings from Last 3 Encounters:  10/17/20 (!) 378 lb 3.2 oz (171.6 kg)   10/10/20 (!) 378 lb 3.2 oz (171.6 kg)  09/26/20 (!) 380 lb 12.8 oz (172.7 kg)    Physical Exam Vitals and nursing note reviewed.  Constitutional:      General: She is not in acute distress.    Appearance: Normal appearance. She is not ill-appearing or diaphoretic.  Eyes:     Conjunctiva/sclera: Conjunctivae normal.  Pulmonary:     Breath sounds: No rhonchi.  Abdominal:     General: Abdomen is flat. Bowel sounds are normal.     Palpations: Abdomen is soft.     Tenderness: There is right CVA tenderness.  Skin:    General: Skin is warm and dry.     Coloration: Skin is not jaundiced.     Findings: No erythema.  Neurological:     Mental Status: She is alert.         Current Outpatient Medications:    AMLODIPINE BENZOATE PO, Take 10 mg by mouth., Disp: , Rfl:    atorvastatin (LIPITOR) 10 MG tablet, Take 1 tablet (10 mg total) by mouth daily., Disp: 90 tablet, Rfl: 3   Cholecalciferol (D3 2000 PO), Take 2 tablets by mouth., Disp: , Rfl:    esomeprazole (NEXIUM) 40 MG capsule, Take 40 mg by mouth daily at 12 noon., Disp: ,  Rfl:    fenofibrate (TRICOR) 145 MG tablet, Take 1 tablet (145 mg total) by mouth daily., Disp: 30 tablet, Rfl: 4   folic acid (FOLVITE) 1 MG tablet, Take 1 mg by mouth daily., Disp: , Rfl:    gabapentin (NEURONTIN) 800 MG tablet, Take 800 mg by mouth 3 (three) times daily., Disp: , Rfl:    ibuprofen (ADVIL) 600 MG tablet, Take 600 mg by mouth every 6 (six) hours as needed., Disp: , Rfl:    Lidocaine (BLUE-EMU PAIN RELIEF DRY EX), Apply topically., Disp: , Rfl:    losartan (COZAAR) 100 MG tablet, Take 1 tablet (100 mg total) by mouth daily., Disp: 30 tablet, Rfl: 4   metoprolol succinate (TOPROL-XL) 25 MG 24 hr tablet, Take 1 tablet (25 mg total) by mouth daily., Disp: 30 tablet, Rfl: 3   Semaglutide,0.25 or 0.5MG /DOS, (OZEMPIC, 0.25 OR 0.5 MG/DOSE,) 2 MG/1.5ML SOPN, Inject 0.25 mg into the skin once a week. Start with 0.25MG  once a week x 4 weeks, then increase to  0.5MG  weekly., Disp: 1.5 mL, Rfl: 3   SUPER B COMPLEX/C PO, Take by mouth., Disp: , Rfl:    tiZANidine (ZANAFLEX) 2 MG tablet, Take by mouth every 6 (six) hours as needed for muscle spasms., Disp: , Rfl:    Tocilizumab (ACTEMRA IV), Inject into the vein., Disp: , Rfl:     Assessment & Plan:  UTI  Recurent with incontinence Will start pt on cipro given CVA tenderness Consider CT abdomen / pelvis Has had a ECOLi Uti last visit and was rx with nitorfurantoin.   OBESITY LOST ABOUT 19 LBS since starting ozempic last visit IE 3 WEEKS AGO  Pt has been on a strict diet with her meds.  Lifestyle modifications advised to pt.   Portion control and avoiding high carb low fat diet advised.  Diet plan given to pt   exercise plan given and encouraged.  To increase exercise to 150 mins a week ie 21/2 hours a week. Pt verbalises understanding of the above.   Problem List Items Addressed This Visit   None Visit Diagnoses     Dysuria    -  Primary   Relevant Orders   Urine Culture   Urinalysis, Routine w reflex microscopic        Orders Placed This Encounter  Procedures   Urine Culture   Urinalysis, Routine w reflex microscopic     No orders of the defined types were placed in this encounter.    Follow up plan: No follow-ups on file.

## 2020-10-19 ENCOUNTER — Telehealth: Payer: Self-pay | Admitting: *Deleted

## 2020-10-19 ENCOUNTER — Telehealth: Payer: Medicare Other

## 2020-10-19 NOTE — Chronic Care Management (AMB) (Signed)
  Care Management   Note  10/19/2020 Name: [name] MRN: [id number] DOB: [birth year]  Elizabeth Patrick is a 50 y.o. year old female who is a primary care patient of Vigg, [name], MD and is actively engaged with the care management team. I reached out to [employer] by phone today to assist with re-scheduling an initial visit with the Licensed Clinical Social Worker  Follow up plan: Unsuccessful telephone outreach attempt made. A HIPAA compliant phone message was left for the patient providing contact information and requesting a return call.  The care management team will reach out to the patient again over the next 7 days.  If patient returns call to provider office, please advise to call [place]  at [phone].  [place] Management  Direct Dial: [phone]

## 2020-10-23 ENCOUNTER — Telehealth: Payer: Self-pay | Admitting: Internal Medicine

## 2020-10-23 LAB — URINE CULTURE

## 2020-10-23 NOTE — Telephone Encounter (Signed)
Copied from [place] [id number]. Topic: General - Other >> Oct 23, 2020  9:33 AM Tessa Lerner A wrote: Reason for CRM: The patient has called to share that they finished their ciprofloxacin (CIPRO) 250 MG tablet [[id number]]  as directed yesterday 10/22/20  The patient continues to experience lingering urinary discomfort and would like to also discuss the (unreleased) results of their urine culture when those results become available  The patient would like to further discuss care and coordination   The patient declined to schedule an additional visit at the time of call with agent  Please contact further when available

## 2020-10-23 NOTE — Telephone Encounter (Signed)
Pt is scheduled for Friday at 11 am

## 2020-10-24 DIAGNOSIS — M1710 Unilateral primary osteoarthritis, unspecified knee: Secondary | ICD-10-CM | POA: Diagnosis not present

## 2020-10-24 DIAGNOSIS — M47817 Spondylosis without myelopathy or radiculopathy, lumbosacral region: Secondary | ICD-10-CM | POA: Diagnosis not present

## 2020-10-24 DIAGNOSIS — G894 Chronic pain syndrome: Secondary | ICD-10-CM | POA: Diagnosis not present

## 2020-10-24 DIAGNOSIS — G629 Polyneuropathy, unspecified: Secondary | ICD-10-CM | POA: Diagnosis not present

## 2020-10-24 NOTE — Progress Notes (Signed)
Pt is already on cipro Ecoli on UA is sensitive to Cipro pl let her know thx.

## 2020-10-26 ENCOUNTER — Telehealth: Payer: Self-pay

## 2020-10-26 NOTE — Telephone Encounter (Signed)
Pt. Given results and instructions. Verbalizes understanding. States she is feeling better. States she is seeing a urologist nest week.

## 2020-10-26 NOTE — Chronic Care Management (AMB) (Signed)
  Care Management   Note  10/26/2020 Name: Elizabeth Patrick MRN: [id number] DOB: Aug 19, 1970  [name] is a 50 y.o. year old female who is a primary care patient of Vigg, [name], MD and is actively engaged with the care management team. I reached out to [employer] by phone today to assist with re-scheduling an initial visit with the Licensed Clinical Social Worker  Follow up plan: A second unsuccessful telephone outreach attempt made. A HIPAA compliant phone message was left for the patient providing contact information and requesting a return call. The care management team will reach out to the patient again over the next 7 days. If patient returns call to provider office, please advise to call [place] at [phone].  [place] Management  Direct Dial: [phone]

## 2020-10-27 ENCOUNTER — Ambulatory Visit: Payer: Medicare Other | Admitting: Internal Medicine

## 2020-10-30 DIAGNOSIS — M0609 Rheumatoid arthritis without rheumatoid factor, multiple sites: Secondary | ICD-10-CM | POA: Diagnosis not present

## 2020-11-01 ENCOUNTER — Encounter: Payer: Self-pay | Admitting: Urology

## 2020-11-01 ENCOUNTER — Ambulatory Visit: Payer: Medicare Other | Admitting: Urology

## 2020-11-01 ENCOUNTER — Other Ambulatory Visit: Payer: Self-pay

## 2020-11-01 VITALS — BP 158/81 | HR 69 | Ht 64.0 in

## 2020-11-01 DIAGNOSIS — N390 Urinary tract infection, site not specified: Secondary | ICD-10-CM | POA: Diagnosis not present

## 2020-11-01 MED ORDER — ESTRADIOL 0.1 MG/GM VA CREA: TOPICAL_CREAM | VAGINAL | 1 refills | Status: DC

## 2020-11-01 MED ORDER — NITROFURANTOIN MONOHYD MACRO 100 MG PO CAPS: 100.0000 mg | ORAL_CAPSULE | Freq: Every day | ORAL | 0 refills | Status: DC

## 2020-11-01 NOTE — Progress Notes (Signed)
   11/01/20 3:00 PM   Prince Crawford Givens 07-28-1970 [id number]  CC: Recurrent UTIs, urinary symptoms  HPI: Comorbid 50 year old female with morbid obesity and rheumatoid arthritis on immunosuppression and diabetes who presents with 2 culture documented E. coli UTIs in the last 2 months.  She was originally treated with nitrofurantoin, then treated with Cipro with improvement in her symptoms.  She reports she always has some irritation and discomfort with her bladder.  She reports she had a complicated abdominal hysterectomy in the past that was complicated by a fistula and hernia.  Those records are not available to me.  She denies any gross hematuria.  She has had some flank and bladder pain over the last few months as well.  Urinalysis today 6-10 WBCs 0-2 RBCs, few bacteria, nitrate negative, 1+ leukocytes.  PVR normal at 20 mL   PMH: Past Medical History:  Diagnosis Date   RA (rheumatoid arthritis) ([place])     Surgical History: Past Surgical History:  Procedure Laterality Date   ABDOMINAL HYSTERECTOMY        Family History: No family history on file.  Social History:  reports that she has never smoked. She has never used smokeless tobacco. She reports that she does not currently use alcohol. She reports that she does not use drugs.  Physical Exam: BP (!) 158/81 (BP Location: Left Wrist, Patient Position: Sitting, Cuff Size: Large)   Pulse 69   Ht 5\' 4"  (1.626 m)   BMI 64.92 kg/m    Constitutional:  Alert and oriented, No acute distress. Cardiovascular: No clubbing, cyanosis, or edema. Respiratory: Normal respiratory effort, no increased work of breathing. GI: Abdomen is soft, nontender, nondistended, no abdominal masses   Laboratory Data: Reviewed, see HPI  Pertinent Imaging: None to review  Assessment & Plan:   50 year old female with recurrent UTIs over the last few years, and 2 culture documented infections in the last 2 months.  We discussed the evaluation and  treatment of patients with recurrent UTIs at length.  We specifically discussed the differences between asymptomatic bacteriuria and true urinary tract infection.  We discussed the AUA definition of recurrent UTI of at least 2 culture proven symptomatic acute cystitis episodes in a 78-month period, or 3 within a 1 year period.  We discussed the importance of culture directed antibiotic treatment, and antibiotic stewardship.  First-line therapy includes nitrofurantoin(5 days), Bactrim(3 days), or fosfomycin(3 g single dose).  Possible etiologies of recurrent infection include periurethral tissue atrophy in postmenopausal woman, constipation, sexual activity, incomplete emptying, anatomic abnormalities, and even genetic predisposition.  Finally, we discussed the role of perineal hygiene, timed voiding, adequate hydration, topical vaginal estrogen, cranberry prophylaxis, and low-dose antibiotic prophylaxis.  Topical estrogen cream, 90 days nitrofurantoin prophylaxis, and continue cranberry tablets for UTI prevention CT stone protocol to evaluate for any stones or foreign body with stone after complex hysterectomy  Nickolas Madrid, MD 11/01/2020  [facility] [address] [place], [place] [address] [phone]

## 2020-11-01 NOTE — Patient Instructions (Addendum)
Asymptomatic Bacteriuria Asymptomatic bacteriuria is the presence of a large number of bacteria in the urine without the usual symptoms of burning or frequent urination. This is usually not harmful, and treatment may not be needed. A person with this condition will not be more likely to develop an infection in the future. What are the causes? This condition is caused by an increase in bacteria in the urine. This increase can be caused by: Bacteria entering the urinary tract, such as during sex. A blockage in the urinary tract, such as from kidney stones or a tumor. Bladder problems that prevent the bladder from emptying. What increases the risk? You are more likely to develop this condition if: You have diabetes. You are an older adult. This especially affects older adults in long-term care facilities. You are pregnant and in the first trimester. You have kidney stones. You are female. You have had a kidney transplant. You have a leaky kidney tube valve (reflux). You had a urinary catheter for a long period of time. This is a long, thin tube that collects urine. What are the signs or symptoms? There are no symptoms of this condition. How is this diagnosed? This condition is diagnosed with a urine test. Because this condition does not cause symptoms, it is usually diagnosed when a urine sample is taken to treat or diagnose another condition, such as pregnancy or kidney problems. Most women who are in their first trimester of pregnancy are screened for asymptomatic bacteriuria. How is this treated? Usually, treatment is not needed for this condition. Treating the condition can lead to other problems, such as a yeast infection or the growth of bacteria that do not respond to treatment (antibiotic-resistant bacteria). Some people do need treatment with antibiotic medicines to prevent kidney infection, known as pyelonephritis. Treatment is needed if: You are pregnant. In pregnant women, kidney  infection can lead to: Early labor (premature labor). Very low birth weight (fetal growth restriction). Newborn death. You are having a procedure that affects the urinary tract. You have had a kidney transplant. If you are diagnosed with this condition, talk with your health care provider about any concerns that you have. Follow these instructions at home: Medicines Take over-the-counter and prescription medicines only as told by your health care provider. If you were prescribed an antibiotic medicine, take it as told by your health care provider. Do not stop using the antibiotic even if you start to feel better. General instructions Monitor your condition for any changes. Drink enough fluid to keep your urine pale yellow. Urinate more often to keep your bladder empty. If you are female, keep the area around your vagina and rectum clean. Wipe from front to back after urinating or having a bowel movement. Use each piece of toilet paper only once. Keep all follow-up visits. This is important. Contact a health care provider if: You have symptoms of a urine infection, such as: A burning sensation, or pain when you urinate. A strong need to urinate, or urinating more often. Urine turning discolored or cloudy. Blood in your urine. Urine that smells bad. Get help right away if: You develop signs of a kidney infection, such as: Back pain or pelvic pain. A fever or chills. Nausea or vomiting. Severe pain that cannot be controlled with medicine. Summary Asymptomatic bacteriuria is the presence of a large number of bacteria in the urine without the usual symptoms of burning or frequent urination. Usually, treatment is not needed for this condition. Treating the condition can lead   to other problems, such as a yeast infection or the growth of bacteria that do not respond to treatment. Some people do need treatment. Treatment is needed if you are pregnant, if you are having a procedure that  affects the urinary tract, or if you have had a kidney transplant. If you were prescribed an antibiotic medicine, take it as told by your health care provider. Do not stop using the antibiotic even if you start to feel better. This information is not intended to replace advice given to you by your health care provider. Make sure you discuss any questions you have with your health care provider. Document Revised: 08/13/2019 Document Reviewed: 08/13/2019 Elsevier Patient Education  [place].  Urinary Tract Infection, Adult A urinary tract infection (UTI) is an infection of any part of the urinary tract. The urinary tract includes the kidneys, ureters, bladder, and urethra. These organs make, store, and get rid of urine in the body. An upper UTI affects the ureters and kidneys. A lower UTI affects the bladder and urethra. What are the causes? Most urinary tract infections are caused by bacteria in your genital area around your urethra, where urine leaves your body. These bacteria grow and cause inflammation of your urinary tract. What increases the risk? You are more likely to develop this condition if: You have a urinary catheter that stays in place. You are not able to control when you urinate or have a bowel movement (incontinence). You are female and you: Use a spermicide or diaphragm for birth control. Have low estrogen levels. Are pregnant. You have certain genes that increase your risk. You are sexually active. You take antibiotic medicines. You have a condition that causes your flow of urine to slow down, such as: An enlarged prostate, if you are female. Blockage in your urethra. A kidney stone. A nerve condition that affects your bladder control (neurogenic bladder). Not getting enough to drink, or not urinating often. You have certain medical conditions, such as: Diabetes. A weak disease-fighting system (immunesystem). Sickle cell disease. Gout. Spinal cord  injury. What are the signs or symptoms? Symptoms of this condition include: Needing to urinate right away (urgency). Frequent urination. This may include small amounts of urine each time you urinate. Pain or burning with urination. Blood in the urine. Urine that smells bad or unusual. Trouble urinating. Cloudy urine. Vaginal discharge, if you are female. Pain in the abdomen or the lower back. You may also have: Vomiting or a decreased appetite. Confusion. Irritability or tiredness. A fever or chills. Diarrhea. The first symptom in older adults may be confusion. In some cases, they may not have any symptoms until the infection has worsened. How is this diagnosed? This condition is diagnosed based on your medical history and a physical exam. You may also have other tests, including: Urine tests. Blood tests. Tests for STIs (sexually transmitted infections). If you have had more than one UTI, a cystoscopy or imaging studies may be done to determine the cause of the infections. How is this treated? Treatment for this condition includes: Antibiotic medicine. Over-the-counter medicines to treat discomfort. Drinking enough water to stay hydrated. If you have frequent infections or have other conditions such as a kidney stone, you may need to see a health care provider who specializes in the urinary tract (urologist). In rare cases, urinary tract infections can cause sepsis. Sepsis is a life-threatening condition that occurs when the body responds to an infection. Sepsis is treated in the hospital with IV antibiotics,  fluids, and other medicines. Follow these instructions at home: Medicines Take over-the-counter and prescription medicines only as told by your health care provider. If you were prescribed an antibiotic medicine, take it as told by your health care provider. Do not stop using the antibiotic even if you start to feel better. General instructions Make sure you: Empty your  bladder often and completely. Do not hold urine for long periods of time. Empty your bladder after sex. Wipe from front to back after urinating or having a bowel movement if you are female. Use each tissue only one time when you wipe. Drink enough fluid to keep your urine pale yellow. Keep all follow-up visits. This is important. Contact a health care provider if: Your symptoms do not get better after 1-2 days. Your symptoms go away and then return. Get help right away if: You have severe pain in your back or your lower abdomen. You have a fever or chills. You have nausea or vomiting. Summary A urinary tract infection (UTI) is an infection of any part of the urinary tract, which includes the kidneys, ureters, bladder, and urethra. Most urinary tract infections are caused by bacteria in your genital area. Treatment for this condition often includes antibiotic medicines. If you were prescribed an antibiotic medicine, take it as told by your health care provider. Do not stop using the antibiotic even if you start to feel better. Keep all follow-up visits. This is important. This information is not intended to replace advice given to you by your health care provider. Make sure you discuss any questions you have with your health care provider. Document Revised: 08/13/2019 Document Reviewed: 08/13/2019 Elsevier Patient Education  [place].

## 2020-11-02 LAB — URINALYSIS, COMPLETE
Bilirubin, UA: NEGATIVE
Glucose, UA: NEGATIVE
Ketones, UA: NEGATIVE
Nitrite, UA: NEGATIVE
Protein,UA: NEGATIVE
RBC, UA: NEGATIVE
Specific Gravity, UA: 1.025 (ref 1.005–1.030)
Urobilinogen, Ur: 0.2 mg/dL (ref 0.2–1.0)
pH, UA: 5.5 (ref 5.0–7.5)

## 2020-11-02 LAB — MICROSCOPIC EXAMINATION

## 2020-11-02 NOTE — Chronic Care Management (AMB) (Signed)
  Care Management   Note  11/02/2020 Name: Elizabeth Patrick MRN: [id number] DOB: 03-Jan-1971  [name] is a 50 y.o. year old female who is a primary care patient of Vigg, [name], MD and is actively engaged with the care management team. I reached out to [employer] by phone today to assist with re-scheduling an initial visit with the Licensed Clinical Social Worker  Follow up plan: Telephone appointment with care management team member scheduled for:11/09/20  [place] [place] Management  Direct Dial: [phone]

## 2020-11-03 DIAGNOSIS — M170 Bilateral primary osteoarthritis of knee: Secondary | ICD-10-CM | POA: Diagnosis not present

## 2020-11-06 ENCOUNTER — Other Ambulatory Visit: Payer: Self-pay | Admitting: Internal Medicine

## 2020-11-06 DIAGNOSIS — G629 Polyneuropathy, unspecified: Secondary | ICD-10-CM

## 2020-11-06 DIAGNOSIS — E669 Obesity, unspecified: Secondary | ICD-10-CM

## 2020-11-06 DIAGNOSIS — I10 Essential (primary) hypertension: Secondary | ICD-10-CM

## 2020-11-06 NOTE — Telephone Encounter (Signed)
Pt called about the Ozempic prescription .  She said she is on the next weeks pen and does not know if she is dosing up from last week.  She called the pharmacy and they asked her to call the office.   [facility] [address]   CB#  [phone]

## 2020-11-06 NOTE — Telephone Encounter (Signed)
Requested medication (s) are due for refill today: yes  Requested medication (s) are on the active medication list: yes  Last refill:  09/28/20 #1.5 ml 3 refills  Future visit scheduled: yes in 2 weeks  Notes to clinic:  taper up medication . Do you want to refill?     Requested Prescriptions  Pending Prescriptions Disp Refills   Semaglutide,0.25 or 0.5MG /DOS, (OZEMPIC, 0.25 OR 0.5 MG/DOSE,) 2 MG/1.5ML SOPN 1.5 mL 3    Sig: Inject 0.25 mg into the skin once a week. Start with 0.25MG  once a week x 4 weeks, then increase to 0.5MG  weekly.     Endocrinology:  Diabetes - GLP-1 Receptor Agonists Passed - 11/06/2020 10:39 AM      Passed - HBA1C is between 0 and 7.9 and within 180 days    HB A1C (BAYER DCA - WAIVED)  Date Value Ref Range Status  09/26/2020 6.5 (H) 4.8 - 5.6 % Final    Comment:             Prediabetes: 5.7 - 6.4          Diabetes: >6.4          Glycemic control for adults with diabetes: <7.0               **Please note reference interval change**           Passed - Valid encounter within last 6 months    Recent Outpatient Visits           2 weeks ago [place] Vigg, [name], MD   3 weeks ago NASH (nonalcoholic steatohepatitis)   [name] Vigg, [name], MD   1 month ago Rheumatoid arthritis, involving unspecified site, unspecified whether rheumatoid factor present [facility])   [name] Vigg, [name], MD       Future Appointments             In 1 week Ellyn Hack, Leonie Green, MD [facility]   In 2 weeks Vigg, [name], MD [facility], [facility]

## 2020-11-07 MED ORDER — OZEMPIC (0.25 OR 0.5 MG/DOSE) 2 MG/1.5ML ~~LOC~~ SOPN: 0.2500 mg | PEN_INJECTOR | SUBCUTANEOUS | 3 refills | Status: DC

## 2020-11-08 ENCOUNTER — Ambulatory Visit (INDEPENDENT_AMBULATORY_CARE_PROVIDER_SITE_OTHER): Payer: Medicare Other

## 2020-11-08 ENCOUNTER — Telehealth: Payer: Medicare Other | Admitting: General Practice

## 2020-11-08 DIAGNOSIS — E785 Hyperlipidemia, unspecified: Secondary | ICD-10-CM

## 2020-11-08 DIAGNOSIS — E119 Type 2 diabetes mellitus without complications: Secondary | ICD-10-CM

## 2020-11-08 DIAGNOSIS — M1990 Unspecified osteoarthritis, unspecified site: Secondary | ICD-10-CM

## 2020-11-08 DIAGNOSIS — I10 Essential (primary) hypertension: Secondary | ICD-10-CM

## 2020-11-08 DIAGNOSIS — N39498 Other specified urinary incontinence: Secondary | ICD-10-CM

## 2020-11-08 DIAGNOSIS — N390 Urinary tract infection, site not specified: Secondary | ICD-10-CM

## 2020-11-08 DIAGNOSIS — M069 Rheumatoid arthritis, unspecified: Secondary | ICD-10-CM

## 2020-11-08 NOTE — Chronic Care Management (AMB) (Signed)
Chronic Care Management   CCM RN Visit Note  11/08/2020 Name: Rosalinda Seaman MRN: [id number] DOB: Apr 09, 1970  Subjective: Elizabeth Patrick is a 50 y.o. year old female who is a primary care patient of Vigg, [name], MD. The care management team was consulted for assistance with disease management and care coordination needs.    Engaged with patient by telephone for initial visit in response to provider referral for case management and/or care coordination services.   Consent to Services:  The patient was given the following information about Chronic Care Management services today, agreed to services, and gave verbal consent: 1. CCM service includes personalized support from designated clinical staff supervised by the primary care provider, including individualized plan of care and coordination with other care providers 2. 24/7 contact phone numbers for assistance for urgent and routine care needs. 3. Service will only be billed when office clinical staff spend 20 minutes or more in a month to coordinate care. 4. Only one practitioner may furnish and bill the service in a calendar month. 5.The patient may stop CCM services at any time (effective at the end of the month) by phone call to the office staff. 6. The patient will be responsible for cost sharing (co-pay) of up to 20% of the service fee (after annual deductible is met). Patient agreed to services and consent obtained.  Patient agreed to services and verbal consent obtained.   Assessment: Review of patient past medical history, allergies, medications, health status, including review of consultants reports, laboratory and other test data, was performed as part of comprehensive evaluation and provision of chronic care management services.   SDOH (Social Determinants of Health) assessments and interventions performed:  SDOH Interventions    Flowsheet Row Most Recent Value  SDOH Interventions   Food Insecurity Interventions Intervention Not  Indicated  Financial Strain Interventions Intervention Not Indicated  Housing Interventions Intervention Not Indicated  Intimate Partner Violence Interventions Intervention Not Indicated  Physical Activity Interventions Intervention Not Indicated  Stress Interventions Intervention Not Indicated  Social Connections Interventions Other (Comment)  [moved her from [place], is happy to be in [place]. Great support system]  Transportation Interventions Intervention Not Indicated        CCM Care Plan  Allergies  Allergen Reactions   Alcohol Anaphylaxis   Dilantin [Phenytoin] Anaphylaxis    Outpatient Encounter Medications as of 11/08/2020  Medication Sig Note   amLODipine (NORVASC) 10 MG tablet Take 10 mg by mouth daily.    atorvastatin (LIPITOR) 10 MG tablet Take 1 tablet (10 mg total) by mouth daily.    Cholecalciferol (D3 2000 PO) Take 2 tablets by mouth.    esomeprazole (NEXIUM) 40 MG capsule Take 40 mg by mouth every other day.    estradiol (ESTRACE) 0.1 MG/GM vaginal cream Estrogen Cream Instruction Discard applicator Apply pea sized amount to tip of finger to urethra before bed. Wash hands well after application. Use Monday, Wednesday and Friday    fenofibrate (TRICOR) 145 MG tablet Take 1 tablet (145 mg total) by mouth daily.    gabapentin (NEURONTIN) 800 MG tablet Take 800 mg by mouth 3 (three) times daily.    ibuprofen (ADVIL) 600 MG tablet Take 600 mg by mouth every 6 (six) hours as needed.    Lidocaine (BLUE-EMU PAIN RELIEF DRY EX) Apply topically.    losartan (COZAAR) 100 MG tablet Take 1 tablet (100 mg total) by mouth daily.    metoprolol succinate (TOPROL-XL) 25 MG 24 hr tablet Take 1 tablet (25 mg total)  by mouth daily.    nitrofurantoin, macrocrystal-monohydrate, (MACROBID) 100 MG capsule Take 1 capsule (100 mg total) by mouth daily.    Semaglutide,0.25 or 0.5MG/DOS, (OZEMPIC, 0.25 OR 0.5 MG/DOSE,) 2 MG/1.5ML SOPN Inject 0.25 mg into the skin once a week. Start with 0.25MG once a  week x 4 weeks, then increase to 0.5MG weekly.    SUPER B COMPLEX/C PO Take by mouth.    tiZANidine (ZANAFLEX) 2 MG tablet Take by mouth every 6 (six) hours as needed for muscle spasms.    Tocilizumab (ACTEMRA IV) Inject into the vein.    hydrochlorothiazide (HYDRODIURIL) 25 MG tablet Take 25 mg by mouth daily. (Patient not taking: No sig reported) 11/08/2020: discontinued   No facility-administered encounter medications on file as of 11/08/2020.    Patient Active Problem List   Diagnosis Date Noted   Recurrent UTI 10/17/2020   Dysuria 10/17/2020   Absence of bladder continence 10/17/2020   PAD (peripheral artery disease) ([place]) 10/11/2020   Neuropathy 10/11/2020   Hyperlipidemia 10/11/2020   Lymphedema 10/11/2020   Screen for colon cancer 10/10/2020   Screening breast examination 10/10/2020   NASH (nonalcoholic steatohepatitis) 10/10/2020   Rheumatoid arthritis ([place]) 09/26/2020   Primary hypertension 09/26/2020   Osteoarthritis 09/26/2020    Conditions to be addressed/monitored:HTN, HLD, DMII, Osteoarthritis, and RA, Obesity, Frequent UTI's with Urinary incontinence   Care Plan : RNCM: General Plan of Care (Adult) for Chronic Disease Management and Care Coordination Needs  Updates made by Vanita Ingles, RN since 11/08/2020 12:00 AM     Problem: RNCM: Development of plan of care for Chronic Disease Mangement and Care Coordination Needs(HTN, HLD, DM, Obesity, Recurrent UTI with incontinence, RA and OA)   Priority: High  Onset Date: 11/08/2020     Long-Range Goal: RNCM: Effective management of plan of care for Chronic Disease Mangement and Care Coordination Needs(HTN, HLD, DM, Obesity, Recurrent UTI with incontinence, RA and OA)   Start Date: 11/08/2020  Expected End Date: 11/08/2021  Priority: High  Note:   Current Barriers:  Knowledge Deficits related to plan of care for management of HTN, HLD, DMII, and Obesity, Recurrent UTI with urinary incontinence, RA, and OA  Chronic  Disease Management support and education needs related to HTN, HLD, DMII, and Obesity, Recurrent UTI with urinary incontinence, RA, and OA   RNCM Clinical Goal(s):  Patient will verbalize understanding of plan for management of HTN, HLD, DMII, Osteoarthritis, and RA, Obesity, Recurrent UTI with urinary incontinence  verbalize basic understanding of HTN, HLD, DMII, Osteoarthritis, and RA, Obesity, Recurrent UTI with urinary incontinence disease process and self health management plan   take all medications exactly as prescribed and will call provider for medication related questions demonstrate understanding of rationale for each prescribed medication   attend all scheduled medical appointments: 11-21-2020 at 2 pm demonstrate improved and ongoing adherence to prescribed treatment plan for HTN, HLD, DMII, Osteoarthritis, and RA, Obesity, recurrent UTI's with urinary incontinence  as evidenced by daily monitoring and recording of CBG  adherence to ADA/ carb modified diet exercise 4/5 days/week adherence to prescribed medication regimen contacting provider for new or worsened symptoms or questions   demonstrate improved and ongoing health management independence   continue to work with Consulting civil engineer and/or Social Worker to address care management and care coordination needs related to HTN, HLD, DMII, Osteoarthritis, and RA, Obesity, frequent UTI's with urinary incontinence   demonstrate a decrease in HTN, HLD, DMII, and Osteoarthritis exacerbations RA, obesity, and recurrent UTI's  with urinary incontinence  demonstrate ongoing self health care management ability to effectively manage chronic conditions  through collaboration with RN Care manager, provider, and care team.   Interventions: 1:1 collaboration with primary care provider regarding development and update of comprehensive plan of care as evidenced by provider attestation and co-signature Inter-disciplinary care team collaboration (see  longitudinal plan of care) Evaluation of current treatment plan related to  self management and patient's adherence to plan as established by provider   SDOH Barriers (Status: Goal on track: YES.) Long Term Goal (>30 days) Patient interviewed and SDOH assessment performed        SDOH Interventions    Flowsheet Row Most Recent Value  SDOH Interventions   Food Insecurity Interventions Intervention Not Indicated  Financial Strain Interventions Intervention Not Indicated  Housing Interventions Intervention Not Indicated  Intimate Partner Violence Interventions Intervention Not Indicated  Physical Activity Interventions Intervention Not Indicated  Stress Interventions Intervention Not Indicated  Social Connections Interventions Other (Comment)  [moved her from [place], is happy to be in [place]. Great support system]  Transportation Interventions Intervention Not Indicated     Patient interviewed and appropriate assessments performed Provided patient with information about resources available in [facility] and care guides to help with any changes in SDOH Discussed plans with patient for ongoing care management follow up and provided patient with direct contact information for care management team Advised patient to to call the office for changes in conditions, changes in SDOH, questions or concerns    Diabetes:  (Status: New goal. Goal on track: YES.) Lab Results  Component Value Date   HGBA1C 6.5 (H) 09/26/2020  Assessed patient's understanding of A1c goal: <7% Provided education to patient about basic DM disease process; Reviewed medications with patient and discussed importance of medication adherence. 11-08-2020: The patient is taking Ozempic as prescribed. States she has had some nausea but not bad. Education and support given;        Reviewed prescribed diet with patient heart healthy/ADA diet ; Counseled on importance of regular laboratory monitoring as prescribed;        Discussed  plans with patient for ongoing care management follow up and provided patient with direct contact information for care management team;      Provided patient with written educational materials related to hypo and hyperglycemia and importance of correct treatment;       Reviewed scheduled/upcoming provider appointments including: 11-21-2020 at 2 pm;         Advised patient, providing education and rationale, to check cbg as directed  and record        call provider for findings outside established parameters;       Review of patient status, including review of consultants reports, relevant laboratory and other test results, and medications completed;       Screening for signs and symptoms of depression related to chronic disease state;        Assessed social determinant of health barriers;         Recurrent UTI's with urinary incontinence   (Status: New goal. Goal on track: NO.) Evaluation of current treatment plan related to  Recurrent UTI's with urinary incontience  ,  self-management and patient's adherence to plan as established by provider. Discussed plans with patient for ongoing care management follow up and provided patient with direct contact information for care management team Advised patient to call the office for changes in urinary health, any sx or sx of recurrent infection, keep appointments; Provided  education to patient re: education on taking showers other than baths, drinking cranberry juice if recommended by the provider, good hygiene and monitoring for changes in urinary health; Reviewed medications with patient and discussed compliance. The patient is taking an ABX for 3 months and following the recommendations of the urolgogist ; Reviewed scheduled/upcoming provider appointments including 11-21-2020; Discussed plans with patient for ongoing care management follow up and provided patient with direct contact information for care management team;  Hyperlipidemia:  (Status: New  goal. Goal on track: NO.) Lab Results  Component Value Date   CHOL 253 (H) 09/26/2020   HDL 56 09/26/2020   LDLCALC 131 (H) 09/26/2020   TRIG 368 (H) 09/26/2020   CHOLHDL 4.5 (H) 09/26/2020     Medication review performed; medication list updated in electronic medical record.  Provider established cholesterol goals reviewed; Counseled on importance of regular laboratory monitoring as prescribed; Provided HLD educational materials; Reviewed role and benefits of statin for ASCVD risk reduction; Discussed strategies to manage statin-induced myalgias; Reviewed importance of limiting foods high in cholesterol; Reviewed exercise goals and target of 150 minutes per week;  Hypertension: (Status: New goal. Goal on track: NO.) Last practice recorded BP readings:  BP Readings from Last 3 Encounters:  11/01/20 (!) 158/81  10/17/20 126/86  10/10/20 137/75  Most recent eGFR/CrCl:  Lab Results  Component Value Date   EGFR 105 09/26/2020    No components found for: CRCL  Evaluation of current treatment plan related to hypertension self management and patient's adherence to plan as established by provider;   Provided education to patient re: stroke prevention, s/s of heart attack and stroke; Reviewed prescribed diet heart healthy/ADA diet  Reviewed medications with patient and discussed importance of compliance;  Counseled on the importance of exercise goals with target of 150 minutes per week Discussed plans with patient for ongoing care management follow up and provided patient with direct contact information for care management team; Advised patient, providing education and rationale, to monitor blood pressure daily and record, calling PCP for findings outside established parameters;  Reviewed scheduled/upcoming provider appointments including: 11-21-2020 at 2 pm Provided education on prescribed diet heart healthy/ADA;  Discussed complications of poorly controlled blood pressure such as  heart disease, stroke, circulatory complications, vision complications, kidney impairment, sexual dysfunction;   Pain: RA and OA:  (Status: New goal. Goal on track: YES.) Pain assessment performed Medications reviewed Reviewed provider established plan for pain management. 11-08-2020: The patient has gotten back on schedule with her monthly infusions for her RA. States she can tell a difference. Had injections in bilateral knees last week and was able to walk in the neighborhood with her daughter. She states since moving from [place] to [place] she is feeling better and wanting to take care of her health and well being. States she hurts in her joints all over and it varies.  She uses topical applications to help with pain as well.  Discussed importance of adherence to all scheduled medical appointments; Counseled on the importance of reporting any/all new or changed pain symptoms or management strategies to pain management provider; Advised patient to report to care team affect of pain on daily activities; Discussed use of relaxation techniques and/or diversional activities to assist with pain reduction (distraction, imagery, relaxation, massage, acupressure, TENS, heat, and cold application; Reviewed with patient prescribed pharmacological and nonpharmacological pain relief strategies; Advised patient to discuss changes in level or intensity of pain with provider;   Weight Loss:  (Status: New goal.) Advised  patient to discuss with primary care provider options regarding weight management;  Provided verbal and/or written education to patient re: provider recommended life style modifications;  Screening for signs and symptoms of depression;  Offered to connect patient with psychology or social work support for counseling and supportive care;  Reviewed recommended dietary changes: avoid fad diets, make small/incremental dietary and exercise changes, eat at the table and avoid eating in front of the TV, plan  management of cravings, monitor snacking and cravings in food diary;  Patient Goals/Self-Care Activities: Patient will self administer medications as prescribed as evidenced by self report/primary caregiver report  Patient will attend all scheduled provider appointments as evidenced by clinician review of documented attendance to scheduled appointments and patient/caregiver report Patient will call pharmacy for medication refills as evidenced by patient report and review of pharmacy fill history as appropriate Patient will attend church or other social activities as evidenced by patient report Patient will continue to perform ADL's independently as evidenced by patient/caregiver report Patient will continue to perform IADL's independently as evidenced by patient/caregiver report Patient will call provider office for new concerns or questions as evidenced by review of documented incoming telephone call notes and patient report Patient will work with BSW to address care coordination needs and will continue to work with the clinical team to address health care and disease management related needs as evidenced by documented adherence to scheduled care management/care coordination appointments - schedule appointment with eye doctor - check feet daily for cuts, sores or redness - set goal weight - trim toenails straight across - drink 6 to 8 glasses of water each day - eat fish at least once per week - fill half of plate with vegetables - limit fast food meals to no more than 1 per week - manage portion size - prepare main meal at home 3 to 5 days each week - read food labels for fat, fiber, carbohydrates and portion size - reduce red meat to 2 to 3 times a week - set a realistic goal - switch to low-fat or skim milk - switch to sugar-free drinks - keep feet up while sitting - wash and dry feet carefully every day - wear comfortable, cotton socks - wear comfortable, well-fitting shoes - check  blood pressure 3 times per week - choose a place to take my blood pressure (home, clinic or office, retail store) - write blood pressure results in a log or diary - learn about high blood pressure - keep a blood pressure log - take blood pressure log to all doctor appointments - call doctor for signs and symptoms of high blood pressure - develop an action plan for high blood pressure - keep all doctor appointments - take medications for blood pressure exactly as prescribed - report new symptoms to your doctor - eat more whole grains, fruits and vegetables, lean meats and healthy fats - call for medicine refill 2 or 3 days before it runs out - take all medications exactly as prescribed - call doctor with any symptoms you believe are related to your medicine - call doctor when you experience any new symptoms - go to all doctor appointments as scheduled - adhere to prescribed diet: Heart healthy/ADA - develop an exercise routine       Plan:Face to Face appointment with care management team member scheduled for: 11-21-2020 at 1:45 pm  Noreene Larsson RN, MSN, [facility] Mobile: [phone]

## 2020-11-08 NOTE — Patient Instructions (Signed)
Visit Information   PATIENT GOALS:   Goals Addressed             This Visit's Progress    RNCM: Cope with Chronic Pain       Timeframe:  Long-Range Goal Priority:  High Start Date:           11-08-2020                  Expected End Date:   11-08-2021                    Follow Up Date 11/21/2020    - join a support group - learn how to meditate - learn relaxation techniques - practice acceptance of chronic pain - practice relaxation or meditation daily - spend time with positive people - tell myself I can (not I can't) - think of new ways to do favorite things - use distraction techniques - use relaxation during pain    Why is this important?   Stress makes chronic pain feel worse.  Feelings like depression, anxiety, stress and anger can make your body more sensitive to pain.  Learning ways to cope with stress or depression may help you find some relief from the pain.     11-08-2020: The patient states that she has RA and OA. The patient has started back with her monthly infusions since moving to [place] from [place] and she is feeling better. Rates her pain level today at a 4 on a scale of 0-10.  The patient states she and her daughter have been able to do some walking and this has helped her. She received shots in bilateral knees last week. Education and support given.      RNCM: Keep Skin Clean and Dry       Follow Up Date 11/21/2020    - clean and dry skin well - keep skin dry - use a fragrance-free lotion on skin - wear a protective pad or garment    Why is this important?   Leaking urine (pee) can cause soreness from skin rashes and redness.  This is caused by skin being exposed to urine (pee).    11-08-2020: The patient has recurrent UTI's and urinary incontinence. The patient saw a specialist recently and is going to start on an antibiotic for 3 months to see if that will help with the UTI.  Education on taking showers instead of baths, good hygiene, drinking cranberry  juice and calling the office for changes in urinary health.      RNCM: Track and Manage My Blood Pressure-Hypertension       Timeframe:  Long-Range Goal Priority:  High Start Date:        11-08-2020                     Expected End Date:       11-08-2021                Follow Up Date 11-21-2020   - check blood pressure daily - choose a place to take my blood pressure (home, clinic or office, retail store) - write blood pressure results in a log or diary    Why is this important?   You won't feel high blood pressure, but it can still hurt your blood vessels.  High blood pressure can cause heart or kidney problems. It can also cause a stroke.  Making lifestyle changes like losing a little weight or eating  less salt will help.  Checking your blood pressure at home and at different times of the day can help to control blood pressure.  If the doctor prescribes medicine remember to take it the way the doctor ordered.  Call the office if you cannot afford the medicine or if there are questions about it.     11-08-2020: The patient has a blood pressure cuff but it is packed up. She and her family are currently living with her brother until they can move into their home 12-14-2020. Education on the benefits of check blood pressures regularly. The patient takes multiple blood pressure medications. Encouraged the patient to take blood pressures when she can.  Will continue to monitor.          CLINICAL CARE PLAN: Patient Care Plan: RNCM: General Plan of Care (Adult) for Chronic Disease Management and Care Coordination Needs     Problem Identified: RNCM: Development of plan of care for Chronic Disease Mangement and Care Coordination Needs(HTN, HLD, DM, Obesity, Recurrent UTI with incontinence, RA and OA)   Priority: High  Onset Date: 11/08/2020     Long-Range Goal: RNCM: Effective management of plan of care for Chronic Disease Mangement and Care Coordination Needs(HTN, HLD, DM, Obesity,  Recurrent UTI with incontinence, RA and OA)   Start Date: 11/08/2020  Expected End Date: 11/08/2021  Priority: High  Note:   Current Barriers:  Knowledge Deficits related to plan of care for management of HTN, HLD, DMII, and Obesity, Recurrent UTI with urinary incontinence, RA, and OA  Chronic Disease Management support and education needs related to HTN, HLD, DMII, and Obesity, Recurrent UTI with urinary incontinence, RA, and OA   RNCM Clinical Goal(s):  Patient will verbalize understanding of plan for management of HTN, HLD, DMII, Osteoarthritis, and RA, Obesity, Recurrent UTI with urinary incontinence  verbalize basic understanding of HTN, HLD, DMII, Osteoarthritis, and RA, Obesity, Recurrent UTI with urinary incontinence disease process and self health management plan   take all medications exactly as prescribed and will call provider for medication related questions demonstrate understanding of rationale for each prescribed medication   attend all scheduled medical appointments: 11-21-2020 at 2 pm demonstrate improved and ongoing adherence to prescribed treatment plan for HTN, HLD, DMII, Osteoarthritis, and RA, Obesity, recurrent UTI's with urinary incontinence  as evidenced by daily monitoring and recording of CBG  adherence to ADA/ carb modified diet exercise 4/5 days/week adherence to prescribed medication regimen contacting provider for new or worsened symptoms or questions   demonstrate improved and ongoing health management independence   continue to work with Consulting civil engineer and/or Social Worker to address care management and care coordination needs related to HTN, HLD, DMII, Osteoarthritis, and RA, Obesity, frequent UTI's with urinary incontinence   demonstrate a decrease in HTN, HLD, DMII, and Osteoarthritis exacerbations RA, obesity, and recurrent UTI's with urinary incontinence  demonstrate ongoing self health care management ability to effectively manage chronic conditions  through  collaboration with RN Care manager, provider, and care team.   Interventions: 1:1 collaboration with primary care provider regarding development and update of comprehensive plan of care as evidenced by provider attestation and co-signature Inter-disciplinary care team collaboration (see longitudinal plan of care) Evaluation of current treatment plan related to  self management and patient's adherence to plan as established by provider   SDOH Barriers (Status: Goal on track: YES.) Long Term Goal (>30 days) Patient interviewed and SDOH assessment performed        SDOH Interventions  Flowsheet Row Most Recent Value  SDOH Interventions   Food Insecurity Interventions Intervention Not Indicated  Financial Strain Interventions Intervention Not Indicated  Housing Interventions Intervention Not Indicated  Intimate Partner Violence Interventions Intervention Not Indicated  Physical Activity Interventions Intervention Not Indicated  Stress Interventions Intervention Not Indicated  Social Connections Interventions Other (Comment)  [moved her from [place], is happy to be in [place]. Great support system]  Transportation Interventions Intervention Not Indicated     Patient interviewed and appropriate assessments performed Provided patient with information about resources available in [facility] and care guides to help with any changes in SDOH Discussed plans with patient for ongoing care management follow up and provided patient with direct contact information for care management team Advised patient to to call the office for changes in conditions, changes in SDOH, questions or concerns    Diabetes:  (Status: New goal. Goal on track: YES.) Lab Results  Component Value Date   HGBA1C 6.5 (H) 09/26/2020  Assessed patient's understanding of A1c goal: <7% Provided education to patient about basic DM disease process; Reviewed medications with patient and discussed importance of medication adherence.  11-08-2020: The patient is taking Ozempic as prescribed. States she has had some nausea but not bad. Education and support given;        Reviewed prescribed diet with patient heart healthy/ADA diet ; Counseled on importance of regular laboratory monitoring as prescribed;        Discussed plans with patient for ongoing care management follow up and provided patient with direct contact information for care management team;      Provided patient with written educational materials related to hypo and hyperglycemia and importance of correct treatment;       Reviewed scheduled/upcoming provider appointments including: 11-21-2020 at 2 pm;         Advised patient, providing education and rationale, to check cbg as directed  and record        call provider for findings outside established parameters;       Review of patient status, including review of consultants reports, relevant laboratory and other test results, and medications completed;       Screening for signs and symptoms of depression related to chronic disease state;        Assessed social determinant of health barriers;         Recurrent UTI's with urinary incontinence   (Status: New goal. Goal on track: NO.) Evaluation of current treatment plan related to  Recurrent UTI's with urinary incontience  ,  self-management and patient's adherence to plan as established by provider. Discussed plans with patient for ongoing care management follow up and provided patient with direct contact information for care management team Advised patient to call the office for changes in urinary health, any sx or sx of recurrent infection, keep appointments; Provided education to patient re: education on taking showers other than baths, drinking cranberry juice if recommended by the provider, good hygiene and monitoring for changes in urinary health; Reviewed medications with patient and discussed compliance. The patient is taking an ABX for 3 months and following the  recommendations of the urolgogist ; Reviewed scheduled/upcoming provider appointments including 11-21-2020; Discussed plans with patient for ongoing care management follow up and provided patient with direct contact information for care management team;  Hyperlipidemia:  (Status: New goal. Goal on track: NO.) Lab Results  Component Value Date   CHOL 253 (H) 09/26/2020   HDL 56 09/26/2020   LDLCALC 131 (H) 09/26/2020  TRIG 368 (H) 09/26/2020   CHOLHDL 4.5 (H) 09/26/2020     Medication review performed; medication list updated in electronic medical record.  Provider established cholesterol goals reviewed; Counseled on importance of regular laboratory monitoring as prescribed; Provided HLD educational materials; Reviewed role and benefits of statin for ASCVD risk reduction; Discussed strategies to manage statin-induced myalgias; Reviewed importance of limiting foods high in cholesterol; Reviewed exercise goals and target of 150 minutes per week;  Hypertension: (Status: New goal. Goal on track: NO.) Last practice recorded BP readings:  BP Readings from Last 3 Encounters:  11/01/20 (!) 158/81  10/17/20 126/86  10/10/20 137/75  Most recent eGFR/CrCl:  Lab Results  Component Value Date   EGFR 105 09/26/2020    No components found for: CRCL  Evaluation of current treatment plan related to hypertension self management and patient's adherence to plan as established by provider;   Provided education to patient re: stroke prevention, s/s of heart attack and stroke; Reviewed prescribed diet heart healthy/ADA diet  Reviewed medications with patient and discussed importance of compliance;  Counseled on the importance of exercise goals with target of 150 minutes per week Discussed plans with patient for ongoing care management follow up and provided patient with direct contact information for care management team; Advised patient, providing education and rationale, to monitor blood pressure  daily and record, calling PCP for findings outside established parameters;  Reviewed scheduled/upcoming provider appointments including: 11-21-2020 at 2 pm Provided education on prescribed diet heart healthy/ADA;  Discussed complications of poorly controlled blood pressure such as heart disease, stroke, circulatory complications, vision complications, kidney impairment, sexual dysfunction;   Pain: RA and OA:  (Status: New goal. Goal on track: YES.) Pain assessment performed Medications reviewed Reviewed provider established plan for pain management. 11-08-2020: The patient has gotten back on schedule with her monthly infusions for her RA. States she can tell a difference. Had injections in bilateral knees last week and was able to walk in the neighborhood with her daughter. She states since moving from [place] to [place] she is feeling better and wanting to take care of her health and well being. States she hurts in her joints all over and it varies.  She uses topical applications to help with pain as well.  Discussed importance of adherence to all scheduled medical appointments; Counseled on the importance of reporting any/all new or changed pain symptoms or management strategies to pain management provider; Advised patient to report to care team affect of pain on daily activities; Discussed use of relaxation techniques and/or diversional activities to assist with pain reduction (distraction, imagery, relaxation, massage, acupressure, TENS, heat, and cold application; Reviewed with patient prescribed pharmacological and nonpharmacological pain relief strategies; Advised patient to discuss changes in level or intensity of pain with provider;   Weight Loss:  (Status: New goal.) Advised patient to discuss with primary care provider options regarding weight management;  Provided verbal and/or written education to patient re: provider recommended life style modifications;  Screening for signs and symptoms of  depression;  Offered to connect patient with psychology or social work support for counseling and supportive care;  Reviewed recommended dietary changes: avoid fad diets, make small/incremental dietary and exercise changes, eat at the table and avoid eating in front of the TV, plan management of cravings, monitor snacking and cravings in food diary;  Patient Goals/Self-Care Activities: Patient will self administer medications as prescribed as evidenced by self report/primary caregiver report  Patient will attend all scheduled provider appointments as evidenced  by clinician review of documented attendance to scheduled appointments and patient/caregiver report Patient will call pharmacy for medication refills as evidenced by patient report and review of pharmacy fill history as appropriate Patient will attend church or other social activities as evidenced by patient report Patient will continue to perform ADL's independently as evidenced by patient/caregiver report Patient will continue to perform IADL's independently as evidenced by patient/caregiver report Patient will call provider office for new concerns or questions as evidenced by review of documented incoming telephone call notes and patient report Patient will work with BSW to address care coordination needs and will continue to work with the clinical team to address health care and disease management related needs as evidenced by documented adherence to scheduled care management/care coordination appointments - schedule appointment with eye doctor - check feet daily for cuts, sores or redness - set goal weight - trim toenails straight across - drink 6 to 8 glasses of water each day - eat fish at least once per week - fill half of plate with vegetables - limit fast food meals to no more than 1 per week - manage portion size - prepare main meal at home 3 to 5 days each week - read food labels for fat, fiber, carbohydrates and portion  size - reduce red meat to 2 to 3 times a week - set a realistic goal - switch to low-fat or skim milk - switch to sugar-free drinks - keep feet up while sitting - wash and dry feet carefully every day - wear comfortable, cotton socks - wear comfortable, well-fitting shoes - check blood pressure 3 times per week - choose a place to take my blood pressure (home, clinic or office, retail store) - write blood pressure results in a log or diary - learn about high blood pressure - keep a blood pressure log - take blood pressure log to all doctor appointments - call doctor for signs and symptoms of high blood pressure - develop an action plan for high blood pressure - keep all doctor appointments - take medications for blood pressure exactly as prescribed - report new symptoms to your doctor - eat more whole grains, fruits and vegetables, lean meats and healthy fats - call for medicine refill 2 or 3 days before it runs out - take all medications exactly as prescribed - call doctor with any symptoms you believe are related to your medicine - call doctor when you experience any new symptoms - go to all doctor appointments as scheduled - adhere to prescribed diet: Heart healthy/ADA - develop an exercise routine     Consent to CCM Services: Ms. Giebel was given information about Chronic Care Management services including:  CCM service includes personalized support from designated clinical staff supervised by her physician, including individualized plan of care and coordination with other care providers 24/7 contact phone numbers for assistance for urgent and routine care needs. Service will only be billed when office clinical staff spend 20 minutes or more in a month to coordinate care. Only one practitioner may furnish and bill the service in a calendar month. The patient may stop CCM services at any time (effective at the end of the month) by phone call to the office staff. The patient  will be responsible for cost sharing (co-pay) of up to 20% of the service fee (after annual deductible is met).  Patient agreed to services and verbal consent obtained.   The patient verbalized understanding of instructions, educational materials, and care plan provided  today and declined offer to receive copy of patient instructions, educational materials, and care plan.   Face to Face appointment with care management team member scheduled for: 11-21-2020 at 1:45 pm  [place], MSN, [facility] Mobile: [phone]

## 2020-11-09 ENCOUNTER — Ambulatory Visit: Payer: Medicare Other | Admitting: Licensed Clinical Social Worker

## 2020-11-09 DIAGNOSIS — I739 Peripheral vascular disease, unspecified: Secondary | ICD-10-CM

## 2020-11-09 DIAGNOSIS — G629 Polyneuropathy, unspecified: Secondary | ICD-10-CM

## 2020-11-09 DIAGNOSIS — M069 Rheumatoid arthritis, unspecified: Secondary | ICD-10-CM

## 2020-11-09 DIAGNOSIS — I10 Essential (primary) hypertension: Secondary | ICD-10-CM

## 2020-11-09 DIAGNOSIS — M1990 Unspecified osteoarthritis, unspecified site: Secondary | ICD-10-CM

## 2020-11-13 ENCOUNTER — Telehealth: Payer: Self-pay | Admitting: Internal Medicine

## 2020-11-13 DIAGNOSIS — I10 Essential (primary) hypertension: Secondary | ICD-10-CM

## 2020-11-13 DIAGNOSIS — E785 Hyperlipidemia, unspecified: Secondary | ICD-10-CM | POA: Diagnosis not present

## 2020-11-13 DIAGNOSIS — E119 Type 2 diabetes mellitus without complications: Secondary | ICD-10-CM

## 2020-11-13 DIAGNOSIS — M1990 Unspecified osteoarthritis, unspecified site: Secondary | ICD-10-CM

## 2020-11-13 DIAGNOSIS — M069 Rheumatoid arthritis, unspecified: Secondary | ICD-10-CM

## 2020-11-13 NOTE — Telephone Encounter (Signed)
Patient alled in asking for lab orders to be faxed  to [facility] inside of [facility] in [place], [place],  [address] /[phone]

## 2020-11-13 NOTE — Patient Instructions (Signed)
Visit Information  Patient verbalizes understanding of instructions provided today  Telephone follow up appointment with care management team member scheduled for:12/28/20  Christa See, MSW, [email or web address] Phone [phone] 4:18 PM

## 2020-11-13 NOTE — Chronic Care Management (AMB) (Signed)
Chronic Care Management    Clinical Social Work Note  11/13/2020 Name: Elizabeth Patrick MRN: [id number] DOB: [birth year]  Elizabeth Patrick is a 50 y.o. year old female who is a primary care patient of Vigg, [name], MD. The CCM team was consulted to assist the patient with chronic disease management and/or care coordination needs related to: [employer] .   Engaged with patient by telephone for initial visit in response to provider referral for social work chronic care management and care coordination services.   Consent to Services:  The patient was given the following information about Chronic Care Management services today, agreed to services, and gave verbal consent: 1. CCM service includes personalized support from designated clinical staff supervised by the primary care provider, including individualized plan of care and coordination with other care providers 2. 24/7 contact phone numbers for assistance for urgent and routine care needs. 3. Service will only be billed when office clinical staff spend 20 minutes or more in a month to coordinate care. 4. Only one practitioner may furnish and bill the service in a calendar month. 5.The patient may stop CCM services at any time (effective at the end of the month) by phone call to the office staff. 6. The patient will be responsible for cost sharing (co-pay) of up to 20% of the service fee (after annual deductible is met). Patient agreed to services and consent obtained.  Patient agreed to services and consent obtained.   Assessment: Review of patient past medical history, allergies, medications, and health status, including review of relevant consultants reports was performed today as part of a comprehensive evaluation and provision of chronic care management and care coordination services.     SDOH (Social Determinants of Health) assessments and interventions performed:    Advanced Directives Status: Not addressed in this encounter.  CCM  Care Plan  Allergies  Allergen Reactions   Alcohol Anaphylaxis   Dilantin [Phenytoin] Anaphylaxis    Outpatient Encounter Medications as of 11/09/2020  Medication Sig Note   amLODipine (NORVASC) 10 MG tablet Take 10 mg by mouth daily.    atorvastatin (LIPITOR) 10 MG tablet Take 1 tablet (10 mg total) by mouth daily.    Cholecalciferol (D3 2000 PO) Take 2 tablets by mouth.    esomeprazole (NEXIUM) 40 MG capsule Take 40 mg by mouth every other day.    estradiol (ESTRACE) 0.1 MG/GM vaginal cream Estrogen Cream Instruction Discard applicator Apply pea sized amount to tip of finger to urethra before bed. Wash hands well after application. Use Monday, Wednesday and Friday    fenofibrate (TRICOR) 145 MG tablet Take 1 tablet (145 mg total) by mouth daily.    gabapentin (NEURONTIN) 800 MG tablet Take 800 mg by mouth 3 (three) times daily.    hydrochlorothiazide (HYDRODIURIL) 25 MG tablet Take 25 mg by mouth daily. (Patient not taking: No sig reported) 11/08/2020: discontinued   ibuprofen (ADVIL) 600 MG tablet Take 600 mg by mouth every 6 (six) hours as needed.    Lidocaine (BLUE-EMU PAIN RELIEF DRY EX) Apply topically.    losartan (COZAAR) 100 MG tablet Take 1 tablet (100 mg total) by mouth daily.    metoprolol succinate (TOPROL-XL) 25 MG 24 hr tablet Take 1 tablet (25 mg total) by mouth daily.    nitrofurantoin, macrocrystal-monohydrate, (MACROBID) 100 MG capsule Take 1 capsule (100 mg total) by mouth daily.    Semaglutide,0.25 or 0.5MG/DOS, (OZEMPIC, 0.25 OR 0.5 MG/DOSE,) 2 MG/1.5ML SOPN Inject 0.25 mg into the skin once a week.  Start with 0.25MG once a week x 4 weeks, then increase to 0.5MG weekly.    SUPER B COMPLEX/C PO Take by mouth.    tiZANidine (ZANAFLEX) 2 MG tablet Take by mouth every 6 (six) hours as needed for muscle spasms.    Tocilizumab (ACTEMRA IV) Inject into the vein.    No facility-administered encounter medications on file as of 11/09/2020.    Patient Active Problem List    Diagnosis Date Noted   Recurrent UTI 10/17/2020   Dysuria 10/17/2020   Absence of bladder continence 10/17/2020   PAD (peripheral artery disease) ([place]) 10/11/2020   Neuropathy 10/11/2020   Hyperlipidemia 10/11/2020   Lymphedema 10/11/2020   Screen for colon cancer 10/10/2020   Screening breast examination 10/10/2020   NASH (nonalcoholic steatohepatitis) 10/10/2020   Rheumatoid arthritis ([place]) 09/26/2020   Primary hypertension 09/26/2020   Osteoarthritis 09/26/2020    Conditions to be addressed/monitored: HTN, Osteoarthritis, and PAD, DMII  Care Plan : LCSW Plan of Care  Updates made by Rebekah Chesterfield, LCSW since 11/13/2020 12:00 AM     Problem: Coping Skills (General Plan of Care)      Goal: Management of MH Symptoms   Start Date: 11/09/2020  This Visit's Progress: On track  Priority: High  Note:   Current barriers:    Limited education about management of chronic health conditions Clinical Goals: Patient will work with CCM LCSW to address needs related to management of stress Clinical Interventions:  Assessment of needs, barriers , agencies contacted, as well as how impacting  Patient reports difficulty managing depression symptoms which were triggered after the passing of sister. Patient has hx of medication management, which was effective for approx. 16 months. Denies current SI/HI Patient recently re-located from [place] one month ago. Family has found a house and receives strong support from brother, who resides locally  Patient endorses ongoing pain due to arthritis and neuropathy. She is coping with the recent diagnosis of diabetes. Since then, patient has lost 25 pounds by modifying diet and implementing an exercise regimen with brother's support Cortisone shots assisted with pain management and she looks forward to knee replacement surgery in the future Patient is interested in obtaining a Nutritionist to assist her with decreasing salt, fat, and sugar intake to assist  with management of chronic health conditions Patient was successful in identifying healthy coping skills that assist with maintaining motivation. Patient's goals are to enjoy activities with family and reduce chronic pain. Strategies to prioritize self-care were discussed Depression screen reviewed  Mindfulness or Relaxation training provided Active listening / Reflection utilized  Emotional Support Provided Increase in actives / exercise encouraged  Verbalization of feelings encouraged  Collaborated with RNCM and PCP about patient's request for a referral to Dietician. CCM contacted [place] [facility] at [place] [phone]. CCM LCSW was referred to the [place] located at [facility] [phone] Information provided to PCP and RNCM  Review various resources, discussed options and provided patient information about local dieticians. Patient is not interested in therapy or med management at this time 1:1 collaboration with primary care provider regarding development and update of comprehensive plan of care as evidenced by provider attestation and co-signature Inter-disciplinary care team collaboration (see longitudinal plan of care) Patient Goals/Self-Care Activities: Over the next 120 days Attend scheduled appointments with providers Utilize healthy coping skills discussed Contact PCP office with any questions or concerns        Christa See, MSW, [place]  Management [email or web address] Phone [phone] 4:17 PM

## 2020-11-14 ENCOUNTER — Other Ambulatory Visit: Payer: Medicare Other

## 2020-11-15 ENCOUNTER — Other Ambulatory Visit: Payer: Self-pay | Admitting: Internal Medicine

## 2020-11-15 DIAGNOSIS — E669 Obesity, unspecified: Secondary | ICD-10-CM

## 2020-11-15 DIAGNOSIS — E785 Hyperlipidemia, unspecified: Secondary | ICD-10-CM

## 2020-11-15 NOTE — Telephone Encounter (Signed)
Labs printed and faxed as requested. Called and notified patient that this was done for her.

## 2020-11-15 NOTE — Telephone Encounter (Signed)
Routing to provider to advise. I do not see any labs in the chart or in your last note for the patient to have. Are there any orders that you would like placed?

## 2020-11-15 NOTE — Telephone Encounter (Signed)
They are in now! Thnx

## 2020-11-15 NOTE — Telephone Encounter (Signed)
Pt following up on this request.  Pt states she needs to have these labs prior to her appt next Tues with Dr Neomia Dear.  Pt would like call back.

## 2020-11-16 ENCOUNTER — Ambulatory Visit: Payer: Medicare Other | Admitting: Cardiology

## 2020-11-17 ENCOUNTER — Other Ambulatory Visit: Payer: Self-pay

## 2020-11-17 ENCOUNTER — Other Ambulatory Visit: Payer: Self-pay | Admitting: Internal Medicine

## 2020-11-17 DIAGNOSIS — E669 Obesity, unspecified: Secondary | ICD-10-CM | POA: Diagnosis not present

## 2020-11-17 DIAGNOSIS — E785 Hyperlipidemia, unspecified: Secondary | ICD-10-CM | POA: Diagnosis not present

## 2020-11-18 LAB — HEMOGLOBIN A1C
Est. average glucose Bld gHb Est-mCnc: 126 mg/dL
Hgb A1c MFr Bld: 6 % — ABNORMAL HIGH (ref 4.8–5.6)

## 2020-11-18 LAB — COMPREHENSIVE METABOLIC PANEL
ALT: 40 IU/L — ABNORMAL HIGH (ref 0–32)
AST: 24 IU/L (ref 0–40)
Albumin/Globulin Ratio: 2.6 — ABNORMAL HIGH (ref 1.2–2.2)
Albumin: 5 g/dL — ABNORMAL HIGH (ref 3.8–4.9)
Alkaline Phosphatase: 54 IU/L (ref 44–121)
BUN/Creatinine Ratio: 19 (ref 9–23)
BUN: 14 mg/dL (ref 6–24)
Bilirubin Total: 0.5 mg/dL (ref 0.0–1.2)
CO2: 26 mmol/L (ref 20–29)
Calcium: 10.2 mg/dL (ref 8.7–10.2)
Chloride: 105 mmol/L (ref 96–106)
Creatinine, Ser: 0.75 mg/dL (ref 0.57–1.00)
Globulin, Total: 1.9 g/dL (ref 1.5–4.5)
Glucose: 119 mg/dL — ABNORMAL HIGH (ref 70–99)
Potassium: 4.1 mmol/L (ref 3.5–5.2)
Sodium: 146 mmol/L — ABNORMAL HIGH (ref 134–144)
Total Protein: 6.9 g/dL (ref 6.0–8.5)
eGFR: 96 mL/min/{1.73_m2} (ref >59)

## 2020-11-20 ENCOUNTER — Telehealth: Payer: Self-pay | Admitting: Internal Medicine

## 2020-11-20 NOTE — Telephone Encounter (Signed)
Copied from [place] [id number]. Topic: Quick Communication - Appointment Cancellation >> Nov 20, 2020  8:55 AM Celene Kras wrote: Patient called to cancel appointment scheduled for 11/20/20 with case manager. Patient has not rescheduled their appointment, but is requesting to have it rescheduled for 11/23/20. Please advise.   Route to department's PEC pool.

## 2020-11-21 ENCOUNTER — Ambulatory Visit: Payer: Medicare Other | Admitting: Internal Medicine

## 2020-11-21 ENCOUNTER — Ambulatory Visit: Payer: Medicare Other

## 2020-11-21 ENCOUNTER — Telehealth: Payer: Self-pay

## 2020-11-21 NOTE — Telephone Encounter (Signed)
  Care Management   Follow Up Note   11/21/2020 Name: Aneesa Romey MRN: [id number] DOB: 18-Aug-1970   Referred by: Charlynne Cousins, MD Reason for referral : Chronic Care Management (RNCM: Follow up for Chronic Disease Management and Care Coordination Needs )   An unsuccessful telephone outreach was attempted today. The patient was referred to the case management team for assistance with care management and care coordination.   Follow Up Plan: A HIPPA compliant phone message was left for the patient providing contact information and requesting a return call.   Noreene Larsson RN, MSN, [facility] Mobile: [phone]

## 2020-11-23 ENCOUNTER — Other Ambulatory Visit: Payer: Self-pay

## 2020-11-23 ENCOUNTER — Ambulatory Visit (INDEPENDENT_AMBULATORY_CARE_PROVIDER_SITE_OTHER): Payer: Medicare Other | Admitting: Internal Medicine

## 2020-11-23 ENCOUNTER — Encounter: Payer: Self-pay | Admitting: Internal Medicine

## 2020-11-23 VITALS — BP 132/77 | HR 61 | Temp 98.3°F | Ht 64.17 in | Wt 358.8 lb

## 2020-11-23 DIAGNOSIS — K219 Gastro-esophageal reflux disease without esophagitis: Secondary | ICD-10-CM | POA: Diagnosis not present

## 2020-11-23 DIAGNOSIS — I10 Essential (primary) hypertension: Secondary | ICD-10-CM | POA: Diagnosis not present

## 2020-11-23 DIAGNOSIS — M069 Rheumatoid arthritis, unspecified: Secondary | ICD-10-CM | POA: Diagnosis not present

## 2020-11-23 DIAGNOSIS — E119 Type 2 diabetes mellitus without complications: Secondary | ICD-10-CM | POA: Diagnosis not present

## 2020-11-23 DIAGNOSIS — E785 Hyperlipidemia, unspecified: Secondary | ICD-10-CM | POA: Diagnosis not present

## 2020-11-23 DIAGNOSIS — Z794 Long term (current) use of insulin: Secondary | ICD-10-CM

## 2020-11-23 DIAGNOSIS — N39498 Other specified urinary incontinence: Secondary | ICD-10-CM

## 2020-11-23 DIAGNOSIS — Z23 Encounter for immunization: Secondary | ICD-10-CM | POA: Diagnosis not present

## 2020-11-23 MED ORDER — AMLODIPINE BESYLATE 10 MG PO TABS: 10.0000 mg | ORAL_TABLET | Freq: Every day | ORAL | 1 refills | Status: DC

## 2020-11-23 MED ORDER — EMPAGLIFLOZIN 10 MG PO TABS: 10.0000 mg | ORAL_TABLET | Freq: Every day | ORAL | 4 refills | Status: DC

## 2020-11-23 NOTE — Progress Notes (Signed)
BP 132/77   Pulse 61   Temp 98.3 F (36.8 C) (Oral)   Ht 5' 4.17" (1.63 m)   Wt (!) 358 lb 12.8 oz (162.8 kg)   SpO2 98%   BMI 61.26 kg/m    Subjective:    [name], female    DOB: [birth year], 50 y.o.   MRN: [id number]  Chief Complaint  Patient presents with   Lab work review    HPI: Elizabeth Patrick is a 50 y.o. female  Had kneees injected with pain mx phsyician, she says she hasnt been needing to use her cnae.  She has lost about 30 lbs total since x august  since seeing me and ozempic 20 lbs.   Diabetes She presents for her follow-up (a1c better) diabetic visit. She has type 2 diabetes mellitus. Her disease course has been improving. Pertinent negatives for diabetes include no blurred vision, no chest pain, no fatigue, no foot paresthesias, no foot ulcerations, no polydipsia, no polyphagia, no polyuria, no visual change, no weakness and no weight loss.   Chief Complaint  Patient presents with   Lab work review    Relevant past medical, surgical, family and social history reviewed and updated as indicated. Interim medical history since our last visit reviewed. Allergies and medications reviewed and updated.  Review of Systems  Constitutional:  Negative for fatigue and weight loss.  Eyes:  Negative for blurred vision.  Cardiovascular:  Negative for chest pain.  Endocrine: Negative for polydipsia, polyphagia and polyuria.  Neurological:  Negative for weakness.   Per HPI unless specifically indicated above     Objective:    BP 132/77   Pulse 61   Temp 98.3 F (36.8 C) (Oral)   Ht 5' 4.17" (1.63 m)   Wt (!) 358 lb 12.8 oz (162.8 kg)   SpO2 98%   BMI 61.26 kg/m   Wt Readings from Last 3 Encounters:  11/23/20 (!) 358 lb 12.8 oz (162.8 kg)  10/17/20 (!) 378 lb 3.2 oz (171.6 kg)  10/10/20 (!) 378 lb 3.2 oz (171.6 kg)    Physical Exam Vitals and nursing note reviewed.  Constitutional:      General: She is not in acute distress.     Appearance: Normal appearance. She is not ill-appearing or diaphoretic.  Eyes:     Conjunctiva/sclera: Conjunctivae normal.  Pulmonary:     Breath sounds: No rhonchi.  Abdominal:     General: Abdomen is flat. Bowel sounds are normal. There is no distension.     Palpations: Abdomen is soft. There is no mass.     Tenderness: There is no abdominal tenderness. There is no guarding.  Skin:    General: Skin is warm and dry.     Coloration: Skin is not jaundiced.     Findings: No erythema.  Neurological:     Mental Status: She is alert.    Results for orders placed or performed in visit on 11/17/20  Comprehensive metabolic panel  Result Value Ref Range   Glucose 119 (H) 70 - 99 mg/dL   BUN 14 6 - 24 mg/dL   Creatinine, Ser 0.75 0.57 - 1.00 mg/dL   eGFR 96 >59 mL/min/1.73   BUN/Creatinine Ratio 19 9 - 23   Sodium 146 (H) 134 - 144 mmol/L   Potassium 4.1 3.5 - 5.2 mmol/L   Chloride 105 96 - 106 mmol/L   CO2 26 20 - 29 mmol/L   Calcium 10.2 8.7 - 10.2 mg/dL  Total Protein 6.9 6.0 - 8.5 g/dL   Albumin 5.0 (H) 3.8 - 4.9 g/dL   Globulin, Total 1.9 1.5 - 4.5 g/dL   Albumin/Globulin Ratio 2.6 (H) 1.2 - 2.2   Bilirubin Total 0.5 0.0 - 1.2 mg/dL   Alkaline Phosphatase 54 44 - 121 IU/L   AST 24 0 - 40 IU/L   ALT 40 (H) 0 - 32 IU/L        Current Outpatient Medications:    atorvastatin (LIPITOR) 10 MG tablet, Take 1 tablet (10 mg total) by mouth daily., Disp: 90 tablet, Rfl: 3   Cholecalciferol (D3 2000 PO), Take 2 tablets by mouth., Disp: , Rfl:    empagliflozin (JARDIANCE) 10 MG TABS tablet, Take 1 tablet (10 mg total) by mouth daily before breakfast., Disp: 30 tablet, Rfl: 4   esomeprazole (NEXIUM) 40 MG capsule, Take 40 mg by mouth every other day., Disp: , Rfl:    estradiol (ESTRACE) 0.1 MG/GM vaginal cream, Estrogen Cream Instruction Discard applicator Apply pea sized amount to tip of finger to urethra before bed. Wash hands well after application. Use Monday, Wednesday and Friday,  Disp: 42.5 g, Rfl: 1   fenofibrate (TRICOR) 145 MG tablet, Take 1 tablet (145 mg total) by mouth daily., Disp: 30 tablet, Rfl: 4   gabapentin (NEURONTIN) 800 MG tablet, Take 800 mg by mouth 3 (three) times daily., Disp: , Rfl:    ibuprofen (ADVIL) 600 MG tablet, Take 600 mg by mouth every 6 (six) hours as needed., Disp: , Rfl:    Lidocaine (BLUE-EMU PAIN RELIEF DRY EX), Apply topically., Disp: , Rfl:    losartan (COZAAR) 100 MG tablet, Take 1 tablet (100 mg total) by mouth daily., Disp: 30 tablet, Rfl: 4   metoprolol succinate (TOPROL-XL) 25 MG 24 hr tablet, Take 1 tablet (25 mg total) by mouth daily., Disp: 30 tablet, Rfl: 3   Semaglutide,0.25 or 0.5MG/DOS, (OZEMPIC, 0.25 OR 0.5 MG/DOSE,) 2 MG/1.5ML SOPN, Inject 0.25 mg into the skin once a week. Start with 0.25MG once a week x 4 weeks, then increase to 0.5MG weekly., Disp: 1.5 mL, Rfl: 3   SUPER B COMPLEX/C PO, Take by mouth., Disp: , Rfl:    tiZANidine (ZANAFLEX) 2 MG tablet, Take by mouth every 6 (six) hours as needed for muscle spasms., Disp: , Rfl:    Tocilizumab (ACTEMRA IV), Inject into the vein., Disp: , Rfl:    amLODipine (NORVASC) 10 MG tablet, Take 1 tablet (10 mg total) by mouth daily., Disp: 90 tablet, Rfl: 1    Assessment & Plan:  DM : is on ozempic for such 0.5 mg q weekly Was on 0.25 mg. A1c better from 6.5 -- 6.0. Add jardiance 10 mg daily.  check HbA1c,  urine  microalbumin  diabetic diet plan given to pt  adviced regarding hypoglycemia and instructions given to pt today on how to prevent and treat the same if it were to occur. pt acknowledges the plan and voices understanding of the same.  exercise plan given and encouraged.   advice diabetic yearly podiatry, ophthalmology , nutritionist , dental check q 6 months  2.obesity :has been exercising. Body mass index is 61.26 kg/m. Pt has lost weight sec to being on ozempci for Dm and Obeisty is down 20 lbs since stating such , is very happy with weight changes and Dm control.   Vitals with BMI 11/23/2020 11/01/2020 10/17/2020  Weight 358 lbs 13 oz  378 lbs 3 oz    Portion control and  avoiding high carb low fat diet advised.  Diet plan given to pt   exercise plan given and encouraged.  To increase exercise to 150 mins a week ie 21/2 hours a week. Pt verbalises understanding of the above.   3. HLD is on fenofibrate and lipitor 10 mg  recheck FLP, check LFT's work on diet, SE of meds explained to pt. low fat and high fiber diet explained to pt.   4. Incontinence Seen by urology : to have an [place] in the office , To have nirtofurantoin x 90 days and topical estrogen cream for such  CT   5. RA  stable, sees rheum infusion next week  to see [place] [facility]um.  6. Htn stable on norvasc, metoprolol and losartan for such    7 gerd GERD continue nexium as rx  patient advised to avoid laying down soon after his meals. He took a 2 hours between dinner and bedtime. Avoid spicy food and triggers that he knows food wise that worsen his acid reflux. Patient verbalized understanding of the above. Lifestyle modifications as above discussed with patient.  Lab Results  Component Value Date   HGBA1C 6.0 (H) 11/17/2020     Problem List Items Addressed This Visit       Other   Hyperlipidemia   Relevant Medications   amLODipine (NORVASC) 10 MG tablet   Other Relevant Orders   CBC with Differential/Platelet   Comprehensive metabolic panel   TSH   Lipid panel   Bayer DCA Hb A1c Waived (STAT)   Other Visit Diagnoses     Need for influenza vaccination    -  Primary   Relevant Orders   Flu Vaccine QUAD 47moIM (Fluarix, Fluzone & Alfiuria Quad PF)   Type 2 diabetes mellitus without complication, with long-term current use of insulin (HCC)       Relevant Medications   empagliflozin (JARDIANCE) 10 MG TABS tablet   Other Relevant Orders   CBC with Differential/Platelet   Comprehensive metabolic panel   TSH   Lipid panel   Bayer DCA Hb A1c Waived (STAT)        Orders  Placed This Encounter  Procedures   Flu Vaccine QUAD 670moM (Fluarix, Fluzone & Alfiuria Quad PF)   CBC with Differential/Platelet   Comprehensive metabolic panel   TSH   Lipid panel   Bayer DCA Hb A1c Waived (STAT)     Meds ordered this encounter  Medications   amLODipine (NORVASC) 10 MG tablet    Sig: Take 1 tablet (10 mg total) by mouth daily.    Dispense:  90 tablet    Refill:  1   empagliflozin (JARDIANCE) 10 MG TABS tablet    Sig: Take 1 tablet (10 mg total) by mouth daily before breakfast.    Dispense:  30 tablet    Refill:  4     Follow up plan: Return in about 6 weeks (around 01/04/2021).

## 2020-11-23 NOTE — Telephone Encounter (Signed)
-----   Message from Charlynne Cousins, MD sent at 11/14/2020  9:21 AM EDT ----- Regarding: FW: [facility] in [facility] Please set up referral if she needs to see these folks thnx.  ----- Message ----- From: Vanita Ingles, RN Sent: 11/14/2020   8:51 AM EDT To: Rebekah Chesterfield, LCSW, Charlynne Cousins, MD Subject: RE: Nigel Sloop in [place], Thank you ----- Message ----- From: Rebekah Chesterfield, LCSW Sent: 11/13/2020   4:22 PM EDT To: Vanita Ingles, RN, Charlynne Cousins, MD Subject: Nigel Sloop in [place]! Patient is looking for a referral to a local Dietician. I contacted [place] [facility] at [facility] [phone] and was referred to the [facility] located at [facility] [phone] Dr. Neomia Dear, I believe you can complete referrals to them in EPIC

## 2020-11-23 NOTE — Telephone Encounter (Signed)
Orders placed just needs your signature

## 2020-11-24 LAB — LIPID PANEL
Chol/HDL Ratio: 2.5 ratio (ref 0.0–4.4)
Cholesterol, Total: 150 mg/dL (ref 100–199)
HDL: 61 mg/dL (ref >39)
LDL Chol Calc (NIH): 70 mg/dL (ref 0–99)
Triglycerides: 106 mg/dL (ref 0–149)
VLDL Cholesterol Cal: 19 mg/dL (ref 5–40)

## 2020-11-27 NOTE — Telephone Encounter (Signed)
Pt has been rescheduled. 

## 2020-11-28 DIAGNOSIS — E119 Type 2 diabetes mellitus without complications: Secondary | ICD-10-CM | POA: Insufficient documentation

## 2020-11-28 DIAGNOSIS — K219 Gastro-esophageal reflux disease without esophagitis: Secondary | ICD-10-CM | POA: Insufficient documentation

## 2020-11-28 DIAGNOSIS — Z23 Encounter for immunization: Secondary | ICD-10-CM | POA: Insufficient documentation

## 2020-11-28 DIAGNOSIS — Z794 Long term (current) use of insulin: Secondary | ICD-10-CM | POA: Insufficient documentation

## 2020-12-01 DIAGNOSIS — M0609 Rheumatoid arthritis without rheumatoid factor, multiple sites: Secondary | ICD-10-CM | POA: Diagnosis not present

## 2020-12-06 DIAGNOSIS — M150 Primary generalized (osteo)arthritis: Secondary | ICD-10-CM | POA: Diagnosis not present

## 2020-12-06 DIAGNOSIS — L403 Pustulosis palmaris et plantaris: Secondary | ICD-10-CM | POA: Diagnosis not present

## 2020-12-06 DIAGNOSIS — M0609 Rheumatoid arthritis without rheumatoid factor, multiple sites: Secondary | ICD-10-CM | POA: Diagnosis not present

## 2020-12-06 DIAGNOSIS — M5136 Other intervertebral disc degeneration, lumbar region: Secondary | ICD-10-CM | POA: Diagnosis not present

## 2020-12-12 ENCOUNTER — Ambulatory Visit (INDEPENDENT_AMBULATORY_CARE_PROVIDER_SITE_OTHER): Payer: Medicare Other | Admitting: Gastroenterology

## 2020-12-12 ENCOUNTER — Encounter: Payer: Self-pay | Admitting: Gastroenterology

## 2020-12-12 ENCOUNTER — Other Ambulatory Visit: Payer: Self-pay | Admitting: Gastroenterology

## 2020-12-12 VITALS — BP 122/76 | HR 64 | Temp 97.8°F | Ht 64.0 in | Wt 357.0 lb

## 2020-12-12 DIAGNOSIS — R7989 Other specified abnormal findings of blood chemistry: Secondary | ICD-10-CM | POA: Diagnosis not present

## 2020-12-12 NOTE — Patient Instructions (Signed)
Mediterranean Diet °A Mediterranean diet refers to food and lifestyle choices that are based on the traditions of countries located on the [place]. It focuses on eating more fruits, vegetables, whole grains, beans, nuts, seeds, and heart-healthy fats, and eating less dairy, meat, eggs, and processed foods with added sugar, salt, and fat. This way of eating has been shown to help prevent certain conditions and improve outcomes for people who have chronic diseases, like kidney disease and heart disease. °What are tips for following this plan? °Reading food labels °Check the serving size of packaged foods. For foods such as rice and pasta, the serving size refers to the amount of cooked product, not dry. °Check the total fat in packaged foods. Avoid foods that have saturated fat or trans fats. °Check the ingredient list for added sugars, such as corn syrup. °Shopping ° °Buy a variety of foods that offer a balanced diet, including: °Fresh fruits and vegetables (produce). °Grains, beans, nuts, and seeds. Some of these may be available in unpackaged forms or large amounts (in bulk). °Fresh seafood. °Poultry and eggs. °Low-fat dairy products. °Buy whole ingredients instead of prepackaged foods. °Buy fresh fruits and vegetables in-season from local farmers markets. °Buy plain frozen fruits and vegetables. °If you do not have access to quality fresh seafood, buy precooked frozen shrimp or canned fish, such as tuna, salmon, or sardines. °Stock your pantry so you always have certain foods on hand, such as olive oil, canned tuna, canned tomatoes, rice, pasta, and beans. °Cooking °Cook foods with extra-virgin olive oil instead of using butter or other vegetable oils. °Have meat as a side dish, and have vegetables or grains as your main dish. This means having meat in small portions or adding small amounts of meat to foods like pasta or stew. °Use beans or vegetables instead of meat in common dishes like chili or  lasagna. °Experiment with different cooking methods. Try roasting, broiling, steaming, and sautéing vegetables. °Add frozen vegetables to soups, stews, pasta, or rice. °Add nuts or seeds for added healthy fats and plant protein at each meal. You can add these to yogurt, salads, or vegetable dishes. °Marinate fish or vegetables using olive oil, lemon juice, garlic, and fresh herbs. °Meal planning °Plan to eat one vegetarian meal one day each week. Try to work up to two vegetarian meals, if possible. °Eat seafood two or more times a week. °Have healthy snacks readily available, such as: °Vegetable sticks with hummus. °Greek yogurt. °Fruit and nut trail mix. °Eat balanced meals throughout the week. This includes: °Fruit: 2-3 servings a day. °Vegetables: 4-5 servings a day. °Low-fat dairy: 2 servings a day. °Fish, poultry, or lean meat: 1 serving a day. °Beans and legumes: 2 or more servings a week. °Nuts and seeds: 1-2 servings a day. °Whole grains: 6-8 servings a day. °Extra-virgin olive oil: 3-4 servings a day. °Limit red meat and sweets to only a few servings a month. °Lifestyle ° °Cook and eat meals together with your family, when possible. °Drink enough fluid to keep your urine pale yellow. °Be physically active every day. This includes: °Aerobic exercise like running or swimming. °Leisure activities like gardening, walking, or housework. °Get 7-8 hours of sleep each night. °If recommended by your health care provider, drink red wine in moderation. This means 1 glass a day for nonpregnant women and 2 glasses a day for men. A glass of wine equals 5 oz (150 mL). °What foods should I eat? °Fruits °Apples. Apricots. Avocado. Berries. Bananas. Cherries. Dates.   Figs. Grapes. Lemons. Melon. Oranges. Peaches. Plums. Pomegranate. °Vegetables °Artichokes. Beets. Broccoli. Cabbage. Carrots. Eggplant. Green beans. Chard. Kale. Spinach. Onions. Leeks. Peas. Squash. Tomatoes. Peppers. Radishes. °Grains °Whole-grain pasta. Brown  rice. Bulgur wheat. Polenta. Couscous. Whole-wheat bread. Oatmeal. Quinoa. °Meats and other proteins °Beans. Almonds. Sunflower seeds. Pine nuts. Peanuts. Cod. Salmon. Scallops. Shrimp. Tuna. Tilapia. Clams. Oysters. Eggs. Poultry without skin. °Dairy °Low-fat milk. Cheese. Greek yogurt. °Fats and oils °Extra-virgin olive oil. Avocado oil. Grapeseed oil. °Beverages °Water. Red wine. Herbal tea. °Sweets and desserts °Greek yogurt with honey. Baked apples. Poached pears. Trail mix. °Seasonings and condiments °Basil. Cilantro. Coriander. Cumin. Mint. Parsley. Sage. Rosemary. Tarragon. Garlic. Oregano. Thyme. Pepper. Balsamic vinegar. Tahini. Hummus. Tomato sauce. Olives. Mushrooms. °The items listed above may not be a complete list of foods and beverages you can eat. Contact a dietitian for more information. °What foods should I limit? °This is a list of foods that should be eaten rarely or only on special occasions. °Fruits °Fruit canned in syrup. °Vegetables °Deep-fried potatoes (french fries). °Grains °Prepackaged pasta or rice dishes. Prepackaged cereal with added sugar. Prepackaged snacks with added sugar. °Meats and other proteins °Beef. Pork. Lamb. Poultry with skin. Hot dogs. Bacon. °Dairy °Ice cream. Sour cream. Whole milk. °Fats and oils °Butter. Canola oil. Vegetable oil. Beef fat (tallow). Lard. °Beverages °Juice. Sugar-sweetened soft drinks. Beer. Liquor and spirits. °Sweets and desserts °Cookies. Cakes. Pies. Candy. °Seasonings and condiments °Mayonnaise. Pre-made sauces and marinades. °The items listed above may not be a complete list of foods and beverages you should limit. Contact a dietitian for more information. °Summary °The Mediterranean diet includes both food and lifestyle choices. °Eat a variety of fresh fruits and vegetables, beans, nuts, seeds, and whole grains. °Limit the amount of red meat and sweets that you eat. °If recommended by your health care provider, drink red wine in moderation.  This means 1 glass a day for nonpregnant women and 2 glasses a day for men. A glass of wine equals 5 oz (150 mL). °This information is not intended to replace advice given to you by your health care provider. Make sure you discuss any questions you have with your health care provider. °Document Revised: 02/05/2019 Document Reviewed: 12/03/2018 °Elsevier Patient Education © 2022 Else[employer] Inc. ° °

## 2020-12-12 NOTE — Progress Notes (Signed)
[name] MD, MRCP(U.K) [address]  [place]  [place], [place] [address]  Main: [phone]  Fax: [phone]   Gastroenterology Consultation  Referring Provider:     Charlynne Cousins, MD Primary Care Physician:  Charlynne Cousins, MD Primary Gastroenterologist:  Dr. [name]  Reason for Consultation:     NASH        HPI:   Elizabeth Patrick is a 50 y.o. y/o female referred for consultation & management  by Dr. Charlynne Cousins, MD for NASH.  She brought in some information about her prior office visits.  It appears that about a year back in 2021 in [place] where she used to live she was found to have elevated liver function tests while on methotrexate and medications for rheumatoid arthritis and underwent a liver biopsy which showed features of [name] but no evidence of cirrhosis.  No fibrosis was noted either.  Had her medications changed.  She has lost about 20 pounds of weight after commencing on these medications intentionally.  Does not drink any alcohol. Labs recently have shown mild elevation in transaminases. Hba1c 6.0, H/o hyperlipidemia, obesity, HTN.  No recent imaging of the abdomen    Hepatic Function Latest Ref Rng & Units 11/17/2020 09/26/2020  Total Protein 6.0 - 8.5 g/dL 6.9 6.7  Albumin 3.8 - 4.9 g/dL 5.0(H) 4.8  AST 0 - 40 IU/L 24 53(H)  ALT 0 - 32 IU/L 40(H) 81(H)  Alk Phosphatase 44 - 121 IU/L 54 60  Total Bilirubin 0.0 - 1.2 mg/dL 0.5 0.7     Past Medical History:  Diagnosis Date   RA (rheumatoid arthritis) ([place])     Past Surgical History:  Procedure Laterality Date   ABDOMINAL HYSTERECTOMY      Prior to Admission medications   Medication Sig Start Date End Date Taking? Authorizing Provider  amLODipine (NORVASC) 10 MG tablet Take 1 tablet (10 mg total) by mouth daily. 11/23/20   Vigg, [name], MD  atorvastatin (LIPITOR) 10 MG tablet Take 1 tablet (10 mg total) by mouth daily. 09/28/20   Vigg, [name], MD  Cholecalciferol (D3 2000 PO) Take 2 tablets  by mouth.    [provider]  empagliflozin (JARDIANCE) 10 MG TABS tablet Take 1 tablet (10 mg total) by mouth daily before breakfast. 11/23/20   Vigg, [name], MD  esomeprazole (NEXIUM) 40 MG capsule Take 40 mg by mouth every other day.    [provider]  estradiol (ESTRACE) 0.1 MG/GM vaginal cream Estrogen Cream Instruction Discard applicator Apply pea sized amount to tip of finger to urethra before bed. Wash hands well after application. Use Monday, Wednesday and Friday 11/01/20   Billey Co, MD  fenofibrate (TRICOR) 145 MG tablet Take 1 tablet (145 mg total) by mouth daily. 09/28/20   Vigg, [name], MD  gabapentin (NEURONTIN) 800 MG tablet Take 800 mg by mouth 3 (three) times daily.    [provider]  ibuprofen (ADVIL) 600 MG tablet Take 600 mg by mouth every 6 (six) hours as needed.    [provider]  Lidocaine (BLUE-EMU PAIN RELIEF DRY EX) Apply topically.    [provider]  losartan (COZAAR) 100 MG tablet Take 1 tablet (100 mg total) by mouth daily. 10/11/20   Vigg, [name], MD  metoprolol succinate (TOPROL-XL) 25 MG 24 hr tablet Take 1 tablet (25 mg total) by mouth daily. 09/26/20   Vigg, [name], MD  Semaglutide,0.25 or 0.5MG/DOS, (OZEMPIC, 0.25 OR 0.5 MG/DOSE,) 2 MG/1.5ML SOPN Inject 0.25  mg into the skin once a week. Start with 0.25MG once a week x 4 weeks, then increase to 0.5MG weekly. 11/07/20   Vigg, [name], MD  SUPER B COMPLEX/C PO Take by mouth.    [provider]  tiZANidine (ZANAFLEX) 2 MG tablet Take by mouth every 6 (six) hours as needed for muscle spasms.    [provider]  Tocilizumab (ACTEMRA IV) Inject into the vein.    [provider]    No family history on file.   Social History   Tobacco Use   Smoking status: Never   Smokeless tobacco: Never  Substance Use Topics   Alcohol use: Not Currently   Drug use: Never    Allergies as of 12/12/2020 - Review Complete 12/12/2020  Allergen  Reaction Noted   Alcohol Anaphylaxis 09/26/2020   Dilantin [phenytoin] Anaphylaxis 09/26/2020    Review of Systems:    All systems reviewed and negative except where noted in HPI.   Physical Exam:  BP 122/76   Pulse 64   Temp 97.8 F (36.6 C) (Oral)   Ht '5\' 4"'  (1.626 m)   Wt (!) 357 lb (161.9 kg)   BMI 61.28 kg/m  No LMP recorded. Patient has had a hysterectomy. Psych:  Alert and cooperative. Normal mood and affect. General:   Alert,  Well-developed, well-nourished, pleasant and cooperative in NAD Head:  Normocephalic and atraumatic. Neurologic:  Alert and oriented x3;  grossly normal neurologically. Psych:  Alert and cooperative. Normal mood and affect.  Imaging Studies: No results found.  Assessment and Plan:   Elizabeth Patrick is a 50 y.o. y/o female has been referred for NASH. No recent imaging available,mild elevation in transaminases, she has features suggestive of metabolic syndrome.  By her liver biopsy in 2021 shows NASH with no cirrhotic findings or fibrosis.   Plan  Full viral hepatitis and autoimmune screen  RUQ USG Fibrosure  Counseled on weight loss, tight glycemic control, management of cardiovascular risk factors as key to management of NAFLD. Limit alcohol intake, suggest mediterranean diet , see a dietician and consider weight loss surgery. Patient information provided     Follow up in 3-4 months   Dr [name] MD,MRCP(U.K)

## 2020-12-14 ENCOUNTER — Ambulatory Visit (INDEPENDENT_AMBULATORY_CARE_PROVIDER_SITE_OTHER): Payer: Medicare Other | Admitting: *Deleted

## 2020-12-14 DIAGNOSIS — Z0000 Encounter for general adult medical examination without abnormal findings: Secondary | ICD-10-CM

## 2020-12-14 DIAGNOSIS — Z1211 Encounter for screening for malignant neoplasm of colon: Secondary | ICD-10-CM

## 2020-12-14 DIAGNOSIS — Z1231 Encounter for screening mammogram for malignant neoplasm of breast: Secondary | ICD-10-CM

## 2020-12-14 LAB — NASH FIBROSURE
ALPHA 2-MACROGLOBULINS, QN: 256 mg/dL (ref 110–276)
ALT (SGPT) P5P: 80 IU/L — ABNORMAL HIGH (ref 0–40)
AST (SGOT) P5P: 43 IU/L — ABNORMAL HIGH (ref 0–40)
Apolipoprotein A-1: 172 mg/dL (ref 116–209)
Bilirubin, Total: 0.3 mg/dL (ref 0.0–1.2)
Cholesterol, Total: 165 mg/dL (ref 100–199)
Fibrosis Score: 0.4 — ABNORMAL HIGH (ref 0.00–0.21)
GGT: 24 IU/L (ref 0–60)
Glucose: 117 mg/dL — ABNORMAL HIGH (ref 70–99)
Haptoglobin: 12 mg/dL — ABNORMAL LOW (ref 33–346)
Height: 64 in
NASH Score: 0.75 — ABNORMAL HIGH
Steatosis Score: 0.43 — ABNORMAL HIGH (ref 0.00–0.30)
Triglycerides: 101 mg/dL (ref 0–149)
Weight: 357 [lb_av]

## 2020-12-14 NOTE — Progress Notes (Signed)
Subjective:   Elizabeth Patrick is a 50 y.o. female who presents for Medicare Annual (Subsequent) preventive examination.  I connected with  Elizabeth Patrick on 12/14/20 by a telephone enabled telemedicine application and verified that I am speaking with the correct person using two identifiers.   I discussed the limitations of evaluation and management by telemedicine. The patient expressed understanding and agreed to proceed.  Patient location: home  Provider location: Tele-Health not in clinic    Review of Systems     Cardiac Risk Factors include: diabetes mellitus;hypertension;obesity (BMI >30kg/m2)     Objective:    Today's Vitals   12/14/20 1142  PainSc: 4    There is no height or weight on file to calculate BMI.  Advanced Directives 12/14/2020  Does Patient Have a Medical Advance Directive? No  Would patient like information on creating a medical advance directive? No - Patient declined    Current Medications (verified) Outpatient Encounter Medications as of 12/14/2020  Medication Sig   amLODipine (NORVASC) 10 MG tablet Take 1 tablet (10 mg total) by mouth daily.   atorvastatin (LIPITOR) 10 MG tablet Take 1 tablet (10 mg total) by mouth daily.   Cholecalciferol (D3 2000 PO) Take 2 tablets by mouth.   empagliflozin (JARDIANCE) 10 MG TABS tablet Take 1 tablet (10 mg total) by mouth daily before breakfast.   esomeprazole (NEXIUM) 40 MG capsule Take 40 mg by mouth every other day.   estradiol (ESTRACE) 0.1 MG/GM vaginal cream Estrogen Cream Instruction Discard applicator Apply pea sized amount to tip of finger to urethra before bed. Wash hands well after application. Use Monday, Wednesday and Friday   fenofibrate (TRICOR) 145 MG tablet Take 1 tablet (145 mg total) by mouth daily.   gabapentin (NEURONTIN) 800 MG tablet Take 800 mg by mouth 3 (three) times daily.   ibuprofen (ADVIL) 600 MG tablet Take 600 mg by mouth every 6 (six) hours as needed.   Lidocaine (BLUE-EMU PAIN  RELIEF DRY EX) Apply topically.   losartan (COZAAR) 100 MG tablet Take 1 tablet (100 mg total) by mouth daily.   metoprolol succinate (TOPROL-XL) 25 MG 24 hr tablet Take 1 tablet (25 mg total) by mouth daily.   Semaglutide,0.25 or 0.5MG /DOS, (OZEMPIC, 0.25 OR 0.5 MG/DOSE,) 2 MG/1.5ML SOPN Inject 0.25 mg into the skin once a week. Start with 0.25MG  once a week x 4 weeks, then increase to 0.5MG  weekly.   SUPER B COMPLEX/C PO Take by mouth.   tiZANidine (ZANAFLEX) 2 MG tablet Take by mouth every 6 (six) hours as needed for muscle spasms.   Tocilizumab (ACTEMRA IV) Inject into the vein.   No facility-administered encounter medications on file as of 12/14/2020.    Allergies (verified) Alcohol and Dilantin [phenytoin]   History: Past Medical History:  Diagnosis Date   RA (rheumatoid arthritis) ([place])    Past Surgical History:  Procedure Laterality Date   ABDOMINAL HYSTERECTOMY     No family history on file. Social History   Socioeconomic History   Marital status: Married    Spouse name: Not on file   Number of children: Not on file   Years of education: Not on file   Highest education level: Not on file  Occupational History   Not on file  Tobacco Use   Smoking status: Never   Smokeless tobacco: Never  Substance and Sexual Activity   Alcohol use: Not Currently   Drug use: Never   Sexual activity: Not Currently    Partners: Male  Birth control/protection: Surgical  Other Topics Concern   Not on file  Social History Narrative   Not on file   Social Determinants of Health   Financial Resource Strain: Low Risk    Difficulty of Paying Living Expenses: Not hard at all  Food Insecurity: No Food Insecurity   Worried About Charity fundraiser in the Last Year: Never true   Arboriculturist in the Last Year: Never true  Transportation Needs: No Transportation Needs   Lack of Transportation (Medical): No   Lack of Transportation (Non-Medical): No  Physical Activity:  Sufficiently Active   Days of Exercise per Week: 5 days   Minutes of Exercise per Session: 50 min  Stress: No Stress Concern Present   Feeling of Stress : Only a little  Social Connections: Moderately Isolated   Frequency of Communication with Friends and Family: More than three times a week   Frequency of Social Gatherings with Friends and Family: More than three times a week   Attends Religious Services: Never   Marine scientist or Organizations: No   Attends Music therapist: Never   Marital Status: Married    Tobacco Counseling Counseling given: Not Answered   Clinical Intake:  Pre-visit preparation completed: Yes  Pain : 0-10 Pain Score: 4  Pain Type: Chronic pain Pain Location: Knee Pain Descriptors / Indicators: Burning, Aching, Numbness, Constant Pain Onset: More than a month ago Pain Frequency: Constant Pain Relieving Factors: gabapentin  Pain Relieving Factors: gabapentin  Nutritional Risks: None Diabetes: Yes CBG done?: No Did pt. bring in CBG monitor from home?: No  How often do you need to have someone help you when you read instructions, pamphlets, or other written materials from your doctor or pharmacy?: 1 - Never  Diabetic?  Yes  Nutrition Risk Assessment:  Has the patient had any N/V/D within the last 2 months?  No  Does the patient have any non-healing wounds?  No  Has the patient had any unintentional weight loss or weight gain?  No   Diabetes:  Is the patient diabetic?  Yes  If diabetic, was a CBG obtained today?  No  Did the patient bring in their glucometer from home?  No  How often do you monitor your CBG's? Does not check.   Financial Strains and Diabetes Management:  Are you having any financial strains with the device, your supplies or your medication? No .  Does the patient want to be seen by Chronic Care Management for management of their diabetes?  No  Would the patient like to be referred to a Nutritionist or  for Diabetic Management?  No   Diabetic Exams:  Diabetic Eye Exam: . Overdue for diabetic eye exam. Pt has been advised about the importance in completing this exam.   Diabetic Foot Exam: . Pt has been advised about the importance in completing this exam.   Interpreter Needed?: No  Information entered by :: Leroy Kennedy LPN   Activities of Daily Living In your present state of health, do you have any difficulty performing the following activities: 12/14/2020  Hearing? N  Vision? N  Difficulty concentrating or making decisions? N  Walking or climbing stairs? Y  Dressing or bathing? N  Doing errands, shopping? N  Preparing Food and eating ? N  Using the Toilet? N  In the past six months, have you accidently leaked urine? Y  Do you have problems with loss of bowel control? N  Managing  your Medications? N  Managing your Finances? N  Housekeeping or managing your Housekeeping? N    Patient Care Team: Charlynne Cousins, MD as PCP - General (Internal Medicine) Vanita Ingles, RN as Registered Nurse ([place]) Rebekah Chesterfield, LCSW as Social Worker (Licensed Clinical Social Worker)  Indicate any recent [place] you may have received from other than [facility] providers in the past year (date may be approximate).     Assessment:   This is a routine wellness examination for [employer].  Hearing/Vision screen Hearing Screening - Comments:: No trouble hearing Vision Screening - Comments:: Not up to date  Dietary issues and exercise activities discussed: Current Exercise Habits: Home exercise routine, Type of exercise: strength training/weights;walking, Time (Minutes): 40, Intensity: Mild   Goals Addressed             This Visit's Progress    Patient Stated       Loose weight       Depression Screen PHQ 2/9 Scores 12/14/2020 11/23/2020 11/08/2020 10/17/2020 10/10/2020 09/26/2020  PHQ - 2 Score 0 1 0 0 0 0  PHQ- 9 Score - 8 - 2 - 9    Fall Risk Fall Risk  12/14/2020  11/23/2020 10/17/2020 10/10/2020 09/26/2020  Falls in the past year? 0 0 0 0 1  Number falls in past yr: 0 0 0 0 -  Injury with Fall? 0 0 0 0 1  Risk for fall due to : - Impaired balance/gait No Fall Risks Impaired balance/gait;Impaired mobility Impaired balance/gait  Follow up Falls evaluation completed;Falls prevention discussed Falls evaluation completed Falls evaluation completed Falls evaluation completed Falls evaluation completed    FALL RISK PREVENTION PERTAINING TO THE HOME:  Any stairs in or around the home? Yes  If so, are there any without handrails? No  Home free of loose throw rugs in walkways, pet beds, electrical cords, etc? Yes  Adequate lighting in your home to reduce risk of falls? Yes   ASSISTIVE DEVICES UTILIZED TO PREVENT FALLS:  Life alert? No  Use of a cane, walker or w/c? Yes  Grab bars in the bathroom? Yes  Shower chair or bench in shower? Yes  Elevated toilet seat or a handicapped toilet? No   TIMED UP AND GO:  Was the test performed? No .    Cognitive Function:  Normal cognitive status assessed by direct observation by this Nurse Health Advisor. No abnormalities found.          Immunizations Immunization History  Administered Date(s) Administered   Influenza,inj,Quad PF,6+ Mos 11/23/2020   Moderna Sars-Covid-2 Vaccination 05/31/2019, 06/28/2019, 01/14/2020    TDAP status: Up to date  Flu Vaccine status: Up to date    Covid-19 vaccine status: Information provided on how to obtain vaccines.   Qualifies for Shingles Vaccine? no Zostavax completed No     Screening Tests Health Maintenance  Topic Date Due   FOOT EXAM  Never done   OPHTHALMOLOGY EXAM  Never done   TETANUS/TDAP  Never done   PAP SMEAR-Modifier  Never done   COLONOSCOPY (Pts 45-32yrs Insurance coverage will need to be confirmed)  Never done   MAMMOGRAM  Never done   Zoster Vaccines- Shingrix (1 of 2) Never done   COVID-19 Vaccine (4 - Booster for Moderna series)  03/10/2020   Pneumococcal Vaccine 33-29 Years old (1 - PCV) 12/14/2021 (Originally 10/01/1976)   HEMOGLOBIN A1C  05/17/2021   INFLUENZA VACCINE  Completed   Hepatitis C Screening  Completed  HIV Screening  Completed   HPV VACCINES  Aged Out    Health Maintenance  Health Maintenance Due  Topic Date Due   FOOT EXAM  Never done   OPHTHALMOLOGY EXAM  Never done   TETANUS/TDAP  Never done   PAP SMEAR-Modifier  Never done   COLONOSCOPY (Pts 45-25yrs Insurance coverage will need to be confirmed)  Never done   MAMMOGRAM  Never done   Zoster Vaccines- Shingrix (1 of 2) Never done   COVID-19 Vaccine (4 - Booster for Moderna series) 03/10/2020    Colorectal cancer screening: Referral to GI placed  . Pt aware the office will call re: appt.  Mammogram-  ordered    Lung Cancer Screening: (Low Dose CT Chest recommended if Age 71-80 years, 30 pack-year currently smoking OR have quit w/in 15years.) does not qualify.   Lung Cancer Screening Referral:   Additional Screening:  Hepatitis C Screening: does not qualify; Completed   Vision Screening: Recommended annual ophthalmology exams for early detection of glaucoma and other disorders of the eye. Is the patient up to date with their annual eye exam?  No  Who is the provider or what is the name of the office in which the patient attends annual eye exams?  If pt is not established with a provider, would they like to be referred to a provider to establish care? No .   Dental Screening: Recommended annual dental exams for proper oral hygiene  Community Resource Referral / Chronic Care Management: CRR required this visit?  No   CCM required this visit?  No      Plan:     I have personally reviewed and noted the following in the patient's chart:   Medical and social history Use of alcohol, tobacco or illicit drugs  Current medications and supplements including opioid prescriptions.  Functional ability and status Nutritional  status Physical activity Advanced directives List of other physicians Hospitalizations, surgeries, and ER visits in previous 12 months Vitals Screenings to include cognitive, depression, and falls Referrals and appointments  In addition, I have reviewed and discussed with patient certain preventive protocols, quality metrics, and best practice recommendations. A written personalized care plan for preventive services as well as general preventive health recommendations were provided to patient.     Leroy Kennedy, LPN   [id number]   Nurse Notes:

## 2020-12-15 ENCOUNTER — Ambulatory Visit
Admission: RE | Admit: 2020-12-15 | Discharge: 2020-12-15 | Disposition: A | Discharge status: Home / Self Care | Payer: Medicare Other | Source: Ambulatory Visit | Attending: Urology | Admitting: Urology

## 2020-12-15 ENCOUNTER — Other Ambulatory Visit: Payer: Self-pay

## 2020-12-15 DIAGNOSIS — K439 Ventral hernia without obstruction or gangrene: Secondary | ICD-10-CM | POA: Diagnosis not present

## 2020-12-15 DIAGNOSIS — K3189 Other diseases of stomach and duodenum: Secondary | ICD-10-CM | POA: Diagnosis not present

## 2020-12-15 DIAGNOSIS — I700 Atherosclerosis of aorta: Secondary | ICD-10-CM | POA: Diagnosis not present

## 2020-12-15 DIAGNOSIS — N390 Urinary tract infection, site not specified: Secondary | ICD-10-CM | POA: Diagnosis not present

## 2020-12-15 DIAGNOSIS — N281 Cyst of kidney, acquired: Secondary | ICD-10-CM | POA: Diagnosis not present

## 2020-12-15 DIAGNOSIS — K429 Umbilical hernia without obstruction or gangrene: Secondary | ICD-10-CM | POA: Diagnosis not present

## 2020-12-15 DIAGNOSIS — K760 Fatty (change of) liver, not elsewhere classified: Secondary | ICD-10-CM | POA: Diagnosis not present

## 2020-12-15 LAB — HEPATITIS B E ANTIBODY: Hep B E Ab: NEGATIVE

## 2020-12-15 LAB — HEPATITIS B SURFACE ANTIGEN: Hepatitis B Surface Ag: NEGATIVE

## 2020-12-15 LAB — CELIAC DISEASE AB SCREEN W/RFX
Antigliadin Abs, IgA: 2 units (ref 0–19)
Transglutaminase IgA: <2 U/mL (ref 0–3)

## 2020-12-15 LAB — MITOCHONDRIAL/SMOOTH MUSCLE AB PNL
Mitochondrial Ab: <20 Units (ref 0.0–20.0)
Smooth Muscle Ab: 4 Units (ref 0–19)

## 2020-12-15 LAB — ANTI-MICROSOMAL ANTIBODY LIVER / KIDNEY: LKM1 Ab: 0.6 Units (ref 0.0–20.0)

## 2020-12-15 LAB — IRON,TIBC AND FERRITIN PANEL
Ferritin: 100 ng/mL (ref 15–150)
Iron Saturation: 22 % (ref 15–55)
Iron: 94 ug/dL (ref 27–159)
Total Iron Binding Capacity: 428 ug/dL (ref 250–450)
UIBC: 334 ug/dL (ref 131–425)

## 2020-12-15 LAB — HEPATITIS C ANTIBODY: Hep C Virus Ab: <0.1 s/co ratio (ref 0.0–0.9)

## 2020-12-15 LAB — HEPATITIS B E ANTIGEN: Hep B E Ag: NEGATIVE

## 2020-12-15 LAB — HEPATITIS B SURFACE ANTIBODY,QUALITATIVE: Hep B Surface Ab, Qual: NONREACTIVE

## 2020-12-15 LAB — ALPHA-1-ANTITRYPSIN: A-1 Antitrypsin: 116 mg/dL (ref 101–187)

## 2020-12-15 LAB — IMMUNOGLOBULINS A/E/G/M, SERUM
IgA/Immunoglobulin A, Serum: 176 mg/dL (ref 87–352)
IgE (Immunoglobulin E), Serum: 3 IU/mL — ABNORMAL LOW (ref 6–495)
IgG (Immunoglobin G), Serum: 597 mg/dL (ref 586–1602)
IgM (Immunoglobulin M), Srm: 68 mg/dL (ref 26–217)

## 2020-12-15 LAB — HEPATITIS B CORE ANTIBODY, TOTAL: Hep B Core Total Ab: NEGATIVE

## 2020-12-15 LAB — HIV ANTIBODY (ROUTINE TESTING W REFLEX): HIV Screen 4th Generation wRfx: NONREACTIVE

## 2020-12-15 LAB — CK: Total CK: 62 U/L (ref 32–182)

## 2020-12-15 LAB — HEPATITIS A ANTIBODY, TOTAL: hep A Total Ab: NEGATIVE

## 2020-12-15 LAB — CERULOPLASMIN: Ceruloplasmin: 13.1 mg/dL — ABNORMAL LOW (ref 19.0–39.0)

## 2020-12-15 LAB — ANA: Anti Nuclear Antibody (ANA): NEGATIVE

## 2020-12-19 ENCOUNTER — Telehealth: Payer: Self-pay

## 2020-12-19 ENCOUNTER — Other Ambulatory Visit: Payer: Self-pay

## 2020-12-19 DIAGNOSIS — Z1211 Encounter for screening for malignant neoplasm of colon: Secondary | ICD-10-CM

## 2020-12-19 MED ORDER — GAVILYTE-G 236 G PO SOLR: 4000.0000 mL | Freq: Once | ORAL | 0 refills | Status: AC

## 2020-12-19 NOTE — Telephone Encounter (Signed)
-----   Message from [name], MD sent at 12/18/2020  2:51 PM EST ----- Not immune to hepatitis a and B-we will need vaccination Ceruloplasmin is low would recommend rechecking in 6 to 8 weeks and if still low will need further evaluation.

## 2020-12-19 NOTE — Telephone Encounter (Signed)
-----   Message from Billey Co, MD sent at 12/18/2020 10:56 AM EST ----- Good news, no urologic abnormalities on CT, keep follow up as scheduled  Nickolas Madrid, MD 12/18/2020

## 2020-12-19 NOTE — Telephone Encounter (Signed)
Called pt no answer, left detailed message per [facility] informing pt of information below. Pt voiced understanding.

## 2020-12-19 NOTE — Progress Notes (Signed)
Gastroenterology Pre-Procedure Review  Request Date: 11/24/2021 Requesting Physician: Dr. Marius Ditch  PATIENT REVIEW QUESTIONS: The patient responded to the following health history questions as indicated:    1. Are you having any GI issues? no 2. Do you have a personal history of Polyps? no 3. Do you have a family history of Colon Cancer or Polyps? no 4. Diabetes Mellitus? yes (Type Ii) 5. Joint replacements in the past 12 months?no 6. Major health problems in the past 3 months?no 7. Any artificial heart valves, MVP, or defibrillator?no    MEDICATIONS & ALLERGIES:    Patient reports the following regarding taking any anticoagulation/antiplatelet therapy:   Plavix, Coumadin, Eliquis, Xarelto, Lovenox, Pradaxa, Brilinta, or Effient? no Aspirin? no  Patient confirms/reports the following medications:  Current Outpatient Medications  Medication Sig Dispense Refill   amLODipine (NORVASC) 10 MG tablet Take 1 tablet (10 mg total) by mouth daily. 90 tablet 1   atorvastatin (LIPITOR) 10 MG tablet Take 1 tablet (10 mg total) by mouth daily. 90 tablet 3   Cholecalciferol (D3 2000 PO) Take 2 tablets by mouth.     empagliflozin (JARDIANCE) 10 MG TABS tablet Take 1 tablet (10 mg total) by mouth daily before breakfast. 30 tablet 4   esomeprazole (NEXIUM) 40 MG capsule Take 40 mg by mouth every other day.     estradiol (ESTRACE) 0.1 MG/GM vaginal cream Estrogen Cream Instruction Discard applicator Apply pea sized amount to tip of finger to urethra before bed. Wash hands well after application. Use Monday, Wednesday and Friday 42.5 g 1   fenofibrate (TRICOR) 145 MG tablet Take 1 tablet (145 mg total) by mouth daily. 30 tablet 4   gabapentin (NEURONTIN) 800 MG tablet Take 800 mg by mouth 3 (three) times daily.     ibuprofen (ADVIL) 600 MG tablet Take 600 mg by mouth every 6 (six) hours as needed.     Lidocaine (BLUE-EMU PAIN RELIEF DRY EX) Apply topically.     losartan (COZAAR) 100 MG tablet Take 1 tablet  (100 mg total) by mouth daily. 30 tablet 4   metoprolol succinate (TOPROL-XL) 25 MG 24 hr tablet Take 1 tablet (25 mg total) by mouth daily. 30 tablet 3   Semaglutide,0.25 or 0.5MG /DOS, (OZEMPIC, 0.25 OR 0.5 MG/DOSE,) 2 MG/1.5ML SOPN Inject 0.25 mg into the skin once a week. Start with 0.25MG  once a week x 4 weeks, then increase to 0.5MG  weekly. 1.5 mL 3   SUPER B COMPLEX/C PO Take by mouth.     tiZANidine (ZANAFLEX) 2 MG tablet Take by mouth every 6 (six) hours as needed for muscle spasms.     Tocilizumab (ACTEMRA IV) Inject into the vein.     No current facility-administered medications for this visit.    Patient confirms/reports the following allergies:  Allergies  Allergen Reactions   Alcohol Anaphylaxis   Dilantin [Phenytoin] Anaphylaxis    No orders of the defined types were placed in this encounter.   AUTHORIZATION INFORMATION Primary Insurance: 1D#: Group #:  Secondary Insurance: 1D#: Group #:  SCHEDULE INFORMATION: Date: 11/24/2021 Time: Location: [facility]

## 2020-12-19 NOTE — Telephone Encounter (Signed)
Called patient but had to leave her a detailed message letting her know the below information. I also mentioned to her that she could come to our office and have her Hepatitis A and B vaccine since she is not immune. I also mentioned that she is needing to have her Ceruloplasmin rechecked since it is low in the next 6-8 weeks.

## 2020-12-21 ENCOUNTER — Ambulatory Visit
Admission: RE | Admit: 2020-12-21 | Discharge: 2020-12-21 | Disposition: A | Discharge status: Home / Self Care | Payer: Medicare Other | Source: Ambulatory Visit | Attending: Gastroenterology | Admitting: Gastroenterology

## 2020-12-21 DIAGNOSIS — R7989 Other specified abnormal findings of blood chemistry: Secondary | ICD-10-CM | POA: Insufficient documentation

## 2020-12-21 DIAGNOSIS — K7689 Other specified diseases of liver: Secondary | ICD-10-CM | POA: Diagnosis not present

## 2020-12-27 ENCOUNTER — Other Ambulatory Visit: Payer: Self-pay

## 2020-12-27 ENCOUNTER — Encounter (INDEPENDENT_AMBULATORY_CARE_PROVIDER_SITE_OTHER): Payer: Self-pay | Admitting: Nurse Practitioner

## 2020-12-27 ENCOUNTER — Ambulatory Visit (INDEPENDENT_AMBULATORY_CARE_PROVIDER_SITE_OTHER): Payer: Medicare Other

## 2020-12-27 ENCOUNTER — Ambulatory Visit (INDEPENDENT_AMBULATORY_CARE_PROVIDER_SITE_OTHER): Payer: Medicare Other | Admitting: Nurse Practitioner

## 2020-12-27 VITALS — BP 143/77 | HR 64 | Resp 16 | Wt 347.6 lb

## 2020-12-27 DIAGNOSIS — E119 Type 2 diabetes mellitus without complications: Secondary | ICD-10-CM | POA: Diagnosis not present

## 2020-12-27 DIAGNOSIS — E785 Hyperlipidemia, unspecified: Secondary | ICD-10-CM

## 2020-12-27 DIAGNOSIS — I739 Peripheral vascular disease, unspecified: Secondary | ICD-10-CM | POA: Diagnosis not present

## 2020-12-27 DIAGNOSIS — I10 Essential (primary) hypertension: Secondary | ICD-10-CM | POA: Diagnosis not present

## 2020-12-27 DIAGNOSIS — Z794 Long term (current) use of insulin: Secondary | ICD-10-CM

## 2020-12-28 ENCOUNTER — Ambulatory Visit (INDEPENDENT_AMBULATORY_CARE_PROVIDER_SITE_OTHER): Payer: Medicare Other | Admitting: Licensed Clinical Social Worker

## 2020-12-28 ENCOUNTER — Other Ambulatory Visit: Payer: Medicare Other

## 2020-12-28 DIAGNOSIS — I739 Peripheral vascular disease, unspecified: Secondary | ICD-10-CM

## 2020-12-28 DIAGNOSIS — Z794 Long term (current) use of insulin: Secondary | ICD-10-CM

## 2020-12-28 DIAGNOSIS — E119 Type 2 diabetes mellitus without complications: Secondary | ICD-10-CM

## 2020-12-28 DIAGNOSIS — E785 Hyperlipidemia, unspecified: Secondary | ICD-10-CM

## 2020-12-28 DIAGNOSIS — G629 Polyneuropathy, unspecified: Secondary | ICD-10-CM

## 2020-12-28 DIAGNOSIS — I10 Essential (primary) hypertension: Secondary | ICD-10-CM

## 2020-12-28 DIAGNOSIS — M069 Rheumatoid arthritis, unspecified: Secondary | ICD-10-CM

## 2020-12-29 DIAGNOSIS — M0609 Rheumatoid arthritis without rheumatoid factor, multiple sites: Secondary | ICD-10-CM | POA: Diagnosis not present

## 2020-12-29 DIAGNOSIS — Z79899 Other long term (current) drug therapy: Secondary | ICD-10-CM | POA: Diagnosis not present

## 2020-12-29 LAB — CBC AND DIFFERENTIAL
HCT: 46 (ref 36–46)
Hemoglobin: 15.8 (ref 12.0–16.0)
Platelets: 211 (ref 150–399)
WBC: 5.7

## 2020-12-29 LAB — HEPATIC FUNCTION PANEL: Bilirubin, Total: 0.7

## 2020-12-29 LAB — CBC: RBC: 4.96 (ref 3.87–5.11)

## 2020-12-29 LAB — BASIC METABOLIC PANEL: Glucose: 119

## 2020-12-29 NOTE — Patient Instructions (Signed)
Visit Information  Thank you for taking time to visit with me today. Please don't hesitate to contact me if I can be of assistance to you before our next scheduled telephone appointment.  Following are the goals we discussed today:  Patient Goals/Self-Care Activities: Over the next 120 days Attend scheduled appointments with providers Utilize healthy coping skills discussed Contact PCP office with any questions or concerns  Our next appointment is by telephone on 04/09/21 at 3:00 PM  Please call the care guide team at [phone] if you need to cancel or reschedule your appointment.   If you are experiencing a Mental Health or [place] or need someone to talk to, please call the Suicide and Crisis Lifeline: 988 call 911   Patient verbalizes understanding of instructions provided today   Christa See, MSW, [email or web address] Phone [phone] 9:43 AM

## 2020-12-29 NOTE — Chronic Care Management (AMB) (Signed)
Chronic Care Management    Clinical Social Work Note  12/29/2020 Name: Elizabeth Patrick MRN: [id number] DOB: 1970-01-20  [name] is a 50 y.o. year old female who is a primary care patient of Vigg, [name], MD. The CCM team was consulted to assist the patient with chronic disease management and/or care coordination needs related to: [place] [facility].   Engaged with patient by telephone for follow up visit in response to provider referral for social work chronic care management and care coordination services.   Consent to Services:  The patient was given information about Chronic Care Management services, agreed to services, and gave verbal consent prior to initiation of services.  Please see initial visit note for detailed documentation.   Patient agreed to services and consent obtained.   Consent to Services:  The patient was given information about Care Management services, agreed to services, and gave verbal consent prior to initiation of services.  Please see initial visit note for detailed documentation.   Patient agreed to services today and consent obtained.  Engaged with patient by phone in response to provider referral for social work care coordination services:  Assessment/Interventions:  Patient continues to maintain positive progress with care plan goals. She reports decrease in stress and has lost approx 40 pounds resulting in increase in energy and mobility. Patient has relocated and is interested in a new referral to dietician in [place].  See Care Plan below for interventions and patient self-care activities.  Recent life changes or stressors: Management of health conditions  Recommendation: Patient may benefit from, and is in agreement work with LCSW to address care coordination needs and will continue to work with the clinical team to address health care and disease management related needs.   Follow up Plan: Patient would like continued  follow-up from CCM LCSW.  per patient's request will follow up in 04/09/21.  Will call office if needed prior to next encounter.    SDOH (Social Determinants of Health) assessments and interventions performed:    Advanced Directives Status: Not addressed in this encounter.  CCM Care Plan  Allergies  Allergen Reactions   Alcohol Anaphylaxis   Dilantin [Phenytoin] Anaphylaxis    Outpatient Encounter Medications as of 12/28/2020  Medication Sig   amLODipine (NORVASC) 10 MG tablet Take 1 tablet (10 mg total) by mouth daily.   atorvastatin (LIPITOR) 10 MG tablet Take 1 tablet (10 mg total) by mouth daily.   Cholecalciferol (D3 2000 PO) Take 2 tablets by mouth.   empagliflozin (JARDIANCE) 10 MG TABS tablet Take 1 tablet (10 mg total) by mouth daily before breakfast.   esomeprazole (NEXIUM) 40 MG capsule Take 40 mg by mouth every other day.   estradiol (ESTRACE) 0.1 MG/GM vaginal cream Estrogen Cream Instruction Discard applicator Apply pea sized amount to tip of finger to urethra before bed. Wash hands well after application. Use Monday, Wednesday and Friday   fenofibrate (TRICOR) 145 MG tablet Take 1 tablet (145 mg total) by mouth daily.   gabapentin (NEURONTIN) 800 MG tablet Take 800 mg by mouth 3 (three) times daily.   ibuprofen (ADVIL) 600 MG tablet Take 600 mg by mouth every 6 (six) hours as needed.   Lidocaine (BLUE-EMU PAIN RELIEF DRY EX) Apply topically.   losartan (COZAAR) 100 MG tablet Take 1 tablet (100 mg total) by mouth daily.   metoprolol succinate (TOPROL-XL) 25 MG 24 hr tablet Take 1 tablet (25 mg total) by mouth daily.   Semaglutide,0.25 or 0.5MG /DOS, (OZEMPIC, 0.25  OR 0.5 MG/DOSE,) 2 MG/1.5ML SOPN Inject 0.25 mg into the skin once a week. Start with 0.25MG  once a week x 4 weeks, then increase to 0.5MG  weekly.   SUPER B COMPLEX/C PO Take by mouth.   tiZANidine (ZANAFLEX) 2 MG tablet Take by mouth every 6 (six) hours as needed for muscle spasms.   Tocilizumab (ACTEMRA IV)  Inject into the vein.   No facility-administered encounter medications on file as of 12/28/2020.    Patient Active Problem List   Diagnosis Date Noted   Type 2 diabetes mellitus without complication, with long-term current use of insulin ([place]) 11/28/2020   Gastroesophageal reflux disease 11/28/2020   Need for influenza vaccination 11/28/2020   Recurrent UTI 10/17/2020   Dysuria 10/17/2020   Absence of bladder continence 10/17/2020   PAD (peripheral artery disease) ([place]) 10/11/2020   Neuropathy 10/11/2020   Hyperlipidemia 10/11/2020   Lymphedema 10/11/2020   Screen for colon cancer 10/10/2020   Screening breast examination 10/10/2020   NASH (nonalcoholic steatohepatitis) 10/10/2020   Rheumatoid arthritis ([place]) 09/26/2020   Primary hypertension 09/26/2020   Osteoarthritis 09/26/2020    Conditions to be addressed/monitored: Mental Health Concerns   Care Plan : LCSW Plan of Care  Updates made by Rebekah Chesterfield, LCSW since 12/29/2020 12:00 AM     Problem: Coping Skills (General Plan of Care)      Goal: Management of MH Symptoms   Start Date: 11/09/2020  This Visit's Progress: On track  Recent Progress: On track  Priority: High  Note:   Current barriers:    Limited education about management of chronic health conditions Clinical Goals: Patient will work with CCM LCSW to address needs related to management of stress Clinical Interventions:  Assessment of needs, barriers , agencies contacted, as well as how impacting  Patient reports difficulty managing depression symptoms which were triggered after the passing of sister. Patient has hx of medication management, which was effective for approx. 16 months. Denies current SI/HI Patient recently re-located from [place] one month ago. Family has found a house and receives strong support from brother, who resides locally 12/15: Patient recently bought a home and moved in this weekend. Pt is excited and reports decrease in stress Patient  endorses ongoing pain due to arthritis and neuropathy. She is coping with the recent diagnosis of diabetes. Since then, patient has lost 25 pounds by modifying diet and implementing an exercise regimen with brother's support Cortisone shots assisted with pain management and she looks forward to knee replacement surgery in the future 12/15: Patient continues to attend scheduled appts with specialists regarding swelling concerns in leg. "We're ruling things out which is good" Patient is interested in obtaining a Nutritionist to assist her with decreasing salt, fat, and sugar intake to assist with management of chronic health conditions 12/15: LCSW collaborated with PCP regarding pt's request for a dietician referral in [place] Patient was successful in identifying healthy coping skills that assist with maintaining motivation. Patient's goals are to enjoy activities with family and reduce chronic pain. Strategies to prioritize self-care were discussed 12/15: CCM LCSW commended patient for her dedication to goals regarding weight loss and compliance with medications. Patient successfully identified factors that assist with maintaining motivation. Patient reports walking is easier and she has an increase in energy Patient reflected on previous "All or nothing" thinking. CCM LCSW discussed how that cognitive distortion can negatively impact progress and discussed healthier strategies to promote change Depression screen reviewed  Mindfulness or Relaxation training provided Active listening /  Reflection utilized  Emotional Support Provided Increase in actives / exercise encouraged  Verbalization of feelings encouraged  Collaborated with RNCM and PCP about patient's request for a referral to Dietician. CCM contacted [place] and [facility] at [place] [phone]. CCM LCSW was referred to the [place] located at [facility] [phone] Information provided to PCP and RNCM   Review various resources, discussed options and provided patient information about local dieticians. Patient is not interested in therapy or med management at this time 1:1 collaboration with primary care provider regarding development and update of comprehensive plan of care as evidenced by provider attestation and co-signature Inter-disciplinary care team collaboration (see longitudinal plan of care) Patient Goals/Self-Care Activities: Over the next 120 days Attend scheduled appointments with providers Utilize healthy coping skills discussed Contact PCP office with any questions or concerns       Christa See, MSW, [email or web address] Phone [phone] 9:39 AM

## 2020-12-30 LAB — COMPREHENSIVE METABOLIC PANEL: Calcium: 10.1 (ref 8.7–10.7)

## 2021-01-02 ENCOUNTER — Telehealth: Payer: Self-pay | Admitting: Internal Medicine

## 2021-01-02 NOTE — Telephone Encounter (Signed)
Please let her know we do not keep sampled of jardiance. But we do have a sample of ozempic she can have. OK to sign 1 box out under my name. Thanks.

## 2021-01-02 NOTE — Telephone Encounter (Signed)
Pt has reached the donut hole and can not afford to get anymore Qzempic and Jardiance for the rest of the year / pt wants to know if Dr. Neomia Dear has any samples until they can discuss a new medication / Pt took hr last dose of Ozempic Saturday and is completely out of Jardiance / Pt stated that next year she would reach the donut hole in March so a discussion about a lower cost option is needed / Pt stated Ozempic is $900 and Jardiance is $160

## 2021-01-03 NOTE — Telephone Encounter (Signed)
Patient will come to office on 01/04/21 to get sample of Ozempic

## 2021-01-04 ENCOUNTER — Telehealth: Payer: Self-pay

## 2021-01-04 ENCOUNTER — Ambulatory Visit: Payer: Medicare Other | Admitting: Internal Medicine

## 2021-01-04 ENCOUNTER — Other Ambulatory Visit: Payer: Self-pay | Admitting: Internal Medicine

## 2021-01-04 DIAGNOSIS — G629 Polyneuropathy, unspecified: Secondary | ICD-10-CM

## 2021-01-04 DIAGNOSIS — E669 Obesity, unspecified: Secondary | ICD-10-CM

## 2021-01-04 DIAGNOSIS — I10 Essential (primary) hypertension: Secondary | ICD-10-CM

## 2021-01-04 NOTE — Telephone Encounter (Signed)
Patient is calling wanting to know her ultrasound results done on 12/21/2020. Patient is also scheduled to have a procedure on 01/24/2021. Please advise.

## 2021-01-04 NOTE — Telephone Encounter (Signed)
Requested medication (s) are due for refill today:   Yes  Requested medication (s) are on the active medication list:   Yes  Future visit scheduled:   Yes   Last ordered: 09/28/2020 #30, 4 refills  Returned because pt is requesting a 90 day supply   Requested Prescriptions  Pending Prescriptions Disp Refills   fenofibrate (TRICOR) 145 MG tablet [Pharmacy Med Name: FENOFIBRATE 145MG TABLETS] 90 tablet     Sig: TAKE 1 TABLET(145 MG) BY MOUTH DAILY     Cardiovascular:  Antilipid - Fibric Acid Derivatives Failed - 01/04/2021  7:59 AM      Failed - ALT in normal range and within 180 days    ALT  Date Value Ref Range Status  11/17/2020 40 (H) 0 - 32 IU/L Final   ALT (SGPT) P5P  Date Value Ref Range Status  12/12/2020 80 (H) 0 - 40 IU/L Final          Failed - AST in normal range and within 180 days    AST  Date Value Ref Range Status  11/17/2020 24 0 - 40 IU/L Final          Passed - Total Cholesterol in normal range and within 360 days    Cholesterol, Total  Date Value Ref Range Status  12/12/2020 165 100 - 199 mg/dL Final          Passed - LDL in normal range and within 360 days    LDL Chol Calc (NIH)  Date Value Ref Range Status  11/23/2020 70 0 - 99 mg/dL Final          Passed - HDL in normal range and within 360 days    HDL  Date Value Ref Range Status  11/23/2020 61 >39 mg/dL Final          Passed - Triglycerides in normal range and within 360 days    Triglycerides  Date Value Ref Range Status  12/12/2020 101 0 - 149 mg/dL Final          Passed - Cr in normal range and within 180 days    Creatinine, Ser  Date Value Ref Range Status  11/17/2020 0.75 0.57 - 1.00 mg/dL Final          Passed - eGFR in normal range and within 180 days    eGFR  Date Value Ref Range Status  11/17/2020 96 >59 mL/min/1.73 Final          Passed - Valid encounter within last 12 months    Recent Outpatient Visits           1 month ago Need for influenza vaccination    [name] Vigg, [name], MD   2 months ago [place] Vigg, [name], MD   2 months ago NASH (nonalcoholic steatohepatitis)   [place] Vigg, [name], MD   3 months ago Rheumatoid arthritis, involving unspecified site, unspecified whether rheumatoid factor present ([place])   [place], [name], MD       Future Appointments             In 1 week Vigg, [name], MD [facility], [place]   In 2 months Agbor-Etang, Aaron Edelman, MD [facility]   In 3 months [name], MD [place]

## 2021-01-05 ENCOUNTER — Other Ambulatory Visit: Payer: Self-pay | Admitting: Internal Medicine

## 2021-01-05 DIAGNOSIS — I10 Essential (primary) hypertension: Secondary | ICD-10-CM

## 2021-01-05 DIAGNOSIS — G629 Polyneuropathy, unspecified: Secondary | ICD-10-CM

## 2021-01-05 DIAGNOSIS — E669 Obesity, unspecified: Secondary | ICD-10-CM

## 2021-01-05 NOTE — Telephone Encounter (Signed)
Called patient to let her know what Dr. Allen Norris stated and recommended for her to do. Patient agreed and had no further questions.

## 2021-01-05 NOTE — Telephone Encounter (Signed)
Requested Prescriptions  Pending Prescriptions Disp Refills   fenofibrate (TRICOR) 145 MG tablet [Pharmacy Med Name: FENOFIBRATE 145MG TABLETS] 90 tablet 0    Sig: TAKE 1 TABLET(145 MG) BY MOUTH DAILY     Cardiovascular:  Antilipid - Fibric Acid Derivatives Failed - 01/05/2021  8:04 AM      Failed - ALT in normal range and within 180 days    ALT  Date Value Ref Range Status  11/17/2020 40 (H) 0 - 32 IU/L Final   ALT (SGPT) P5P  Date Value Ref Range Status  12/12/2020 80 (H) 0 - 40 IU/L Final         Failed - AST in normal range and within 180 days    AST  Date Value Ref Range Status  11/17/2020 24 0 - 40 IU/L Final         Passed - Total Cholesterol in normal range and within 360 days    Cholesterol, Total  Date Value Ref Range Status  12/12/2020 165 100 - 199 mg/dL Final         Passed - LDL in normal range and within 360 days    LDL Chol Calc (NIH)  Date Value Ref Range Status  11/23/2020 70 0 - 99 mg/dL Final         Passed - HDL in normal range and within 360 days    HDL  Date Value Ref Range Status  11/23/2020 61 >39 mg/dL Final         Passed - Triglycerides in normal range and within 360 days    Triglycerides  Date Value Ref Range Status  12/12/2020 101 0 - 149 mg/dL Final         Passed - Cr in normal range and within 180 days    Creatinine, Ser  Date Value Ref Range Status  11/17/2020 0.75 0.57 - 1.00 mg/dL Final         Passed - eGFR in normal range and within 180 days    eGFR  Date Value Ref Range Status  11/17/2020 96 >59 mL/min/1.73 Final         Passed - Valid encounter within last 12 months    Recent Outpatient Visits          1 month ago Need for influenza vaccination   [name] Vigg, [name], MD   2 months ago [place] Vigg, [name], MD   2 months ago NASH (nonalcoholic steatohepatitis)   [place] Vigg, [name], MD   3 months ago Rheumatoid arthritis, involving unspecified  site, unspecified whether rheumatoid factor present ([place])   [place], [name], MD      Future Appointments            In 1 week Vigg, [name], MD [facility], [place]   In 1 month Agbor-Etang, Aaron Edelman, MD [facility]   In 3 months [name], MD [place]

## 2021-01-08 ENCOUNTER — Encounter (INDEPENDENT_AMBULATORY_CARE_PROVIDER_SITE_OTHER): Payer: Self-pay | Admitting: Nurse Practitioner

## 2021-01-08 NOTE — Progress Notes (Signed)
Subjective:    [name], female    DOB: 1970/02/16, 50 y.o.   MRN: [id number] Chief Complaint  Patient presents with   New Patient (Initial Visit)    Ref vascular consult ABI    Elizabeth Patrick is a 50 year old female that presents today due to concern for possible peripheral arterial disease.  The patient notes that she walks a distance and begins to have pain in her leg.  She notes that she can walk for short distance before having cramping which causes her to stop.  The patient notes that she has rheumatoid arthritis as well as issues with her knees.  She denies any rest pain like symptoms.  She denies any open wounds or ulcerations.  The patient has right ABI 0.93 and the left is 0.92.  The patient has triphasic tibial artery waveforms with normal toe waveforms bilaterally.   Review of Systems  Musculoskeletal:  Positive for arthralgias, gait problem and joint swelling.  All other systems reviewed and are negative.     Objective:   Physical Exam Vitals reviewed.  Constitutional:      Appearance: She is obese.  Cardiovascular:     Rate and Rhythm: Normal rate.     Pulses: Normal pulses.  Pulmonary:     Effort: Pulmonary effort is normal.  Skin:    General: Skin is warm and dry.  Neurological:     Mental Status: She is alert and oriented to person, place, and time.  Psychiatric:        Mood and Affect: Mood normal.        Behavior: Behavior normal.        Thought Content: Thought content normal.        Judgment: Judgment normal.    BP (!) 143/77 (BP Location: Left Arm)    Pulse 64    Resp 16    Wt (!) 347 lb 9.6 oz (157.7 kg)    BMI 59.67 kg/m   Past Medical History:  Diagnosis Date   RA (rheumatoid arthritis) ([place])     Social History   Socioeconomic History   Marital status: Married    Spouse name: Not on file   Number of children: Not on file   Years of education: Not on file   Highest education level: Not on file  Occupational History    Not on file  Tobacco Use   Smoking status: Never   Smokeless tobacco: Never  Substance and Sexual Activity   Alcohol use: Not Currently   Drug use: Never   Sexual activity: Not Currently    Partners: Male    Birth control/protection: Surgical  Other Topics Concern   Not on file  Social History Narrative   Not on file   Social Determinants of Health   Financial Resource Strain: Low Risk    Difficulty of Paying Living Expenses: Not hard at all  Food Insecurity: No Food Insecurity   Worried About Charity fundraiser in the Last Year: Never true   Ran Out of Food in the Last Year: Never true  Transportation Needs: No Transportation Needs   Lack of Transportation (Medical): No   Lack of Transportation (Non-Medical): No  Physical Activity: Sufficiently Active   Days of Exercise per Week: 5 days   Minutes of Exercise per Session: 50 min  Stress: No Stress Concern Present   Feeling of Stress : Only a little  Social Connections: Moderately Isolated   Frequency of Communication with Friends  and Family: More than three times a week   Frequency of Social Gatherings with Friends and Family: More than three times a week   Attends Religious Services: Never   Marine scientist or Organizations: No   Attends Music therapist: Never   Marital Status: Married  Human resources officer Violence: Not At Risk   Fear of Current or Ex-Partner: No   Emotionally Abused: No   Physically Abused: No   Sexually Abused: No    Past Surgical History:  Procedure Laterality Date   ABDOMINAL HYSTERECTOMY      Family History  Problem Relation Age of Onset   Obesity Mother    Coronary artery disease Mother    Bipolar disorder Mother    Multiple sclerosis Mother    Hypertension Father    Heart attack Father    Vascular Disease Father     Allergies  Allergen Reactions   Alcohol Anaphylaxis   Dilantin [Phenytoin] Anaphylaxis    CBC Latest Ref Rng & Units 09/26/2020  WBC 3.4 - 10.8  x10E3/uL 8.3  Hemoglobin 11.1 - 15.9 g/dL 16.0(H)  Hematocrit 34.0 - 46.6 % 46.4  Platelets 150 - 450 x10E3/uL 223      CMP     Component Value Date/Time   NA 146 (H) 11/17/2020 0922   K 4.1 11/17/2020 0922   CL 105 11/17/2020 0922   CO2 26 11/17/2020 0922   GLUCOSE 119 (H) 11/17/2020 0922   BUN 14 11/17/2020 0922   CREATININE 0.75 11/17/2020 0922   CALCIUM 10.2 11/17/2020 0922   PROT 6.9 11/17/2020 0922   ALBUMIN 5.0 (H) 11/17/2020 0922   AST 24 11/17/2020 0922   ALT 40 (H) 11/17/2020 0922   ALKPHOS 54 11/17/2020 0922   BILITOT 0.5 11/17/2020 0922     VAS [place] ABI WITH/WO TBI  Result Date: 12/28/2020  LOWER EXTREMITY DOPPLER STUDY Patient Name:  Elizabeth Patrick  Date of Exam:   12/27/2020 Medical Rec #: [id number]       Accession #:    [id number] Date of Birth: [birth year]       Patient Gender: F Patient Age:   72 years Exam Location:  [facility] Procedure:      VAS [place] ABI WITH/WO TBI Referring Phys: Hortencia Pilar --------------------------------------------------------------------------------  Indications: Rest pain.  Performing Technologist: Almira Coaster RVS  Examination Guidelines: A complete evaluation includes at minimum, Doppler waveform signals and systolic blood pressure reading at the level of bilateral brachial, anterior tibial, and posterior tibial arteries, when vessel segments are accessible. Bilateral testing is considered an integral part of a complete examination. Photoelectric Plethysmograph (PPG) waveforms and toe systolic pressure readings are included as required and additional duplex testing as needed. Limited examinations for reoccurring indications may be performed as noted.  ABI Findings: +---------+------------------+-----+---------+--------+  Right     Rt Pressure (mmHg) Index Waveform  Comment   +---------+------------------+-----+---------+--------+  Brachial  170                                           +---------+------------------+-----+---------+--------+  ATA       158                      triphasic .93       +---------+------------------+-----+---------+--------+  PTA       143  0.84  triphasic           +---------+------------------+-----+---------+--------+  Great Toe 195                1.15  Normal              +---------+------------------+-----+---------+--------+ +---------+------------------+-----+---------+-------+  Left      Lt Pressure (mmHg) Index Waveform  Comment  +---------+------------------+-----+---------+-------+  Brachial  150                                         +---------+------------------+-----+---------+-------+  ATA       132                      triphasic .78      +---------+------------------+-----+---------+-------+  PTA       156                0.92  triphasic          +---------+------------------+-----+---------+-------+  Great Toe 171                1.01  Normal             +---------+------------------+-----+---------+-------+ +-------+-----------+-----------+------------+------------+  ABI/TBI Today's ABI Today's TBI Previous ABI Previous TBI  +-------+-----------+-----------+------------+------------+  Right   .93         1.15                                   +-------+-----------+-----------+------------+------------+  Left    .92         1.01                                   +-------+-----------+-----------+------------+------------+  Summary: Right: Resting right ankle-brachial index indicates mild right lower extremity arterial disease. The right toe-brachial index is normal. Left: Resting left ankle-brachial index indicates mild left lower extremity arterial disease. The left toe-brachial index is normal.  *See table(s) above for measurements and observations.  Electronically signed by Hortencia Pilar MD on 12/28/2020 at 5:35:12 PM.    Final        Assessment & Plan:   1. PAD (peripheral artery disease) (HCC) Recommend:  I do not find evidence  of Vascular pathology that would explain the patient's symptoms  The patient has atypical pain symptoms for vascular disease  I do not find evidence of Vascular pathology that would explain the patient's symptoms and I suspect the patient is c/o pseudoclaudication.  Patient should have an evaluation of his LS spine which I defer to the primary service.   The patient should continue walking and begin a more formal exercise program. The patient should continue his antiplatelet therapy and aggressive treatment of the lipid abnormalities. The patient should begin wearing graduated compression socks 15-20 mmHg strength to control her mild edema.  Patient will follow-up with me on a PRN basis  Further work-up of her lower extremity pain is deferred to the primary service      2. Type 2 diabetes mellitus without complication, with long-term current use of insulin (HCC) Continue hypoglycemic medications as already ordered, these medications have been reviewed and there are no changes at this time.  Hgb A1C to be monitored as already arranged by primary service  3. Primary hypertension Continue antihypertensive medications as already ordered, these medications have been reviewed and there are no changes at this time.   4. Hyperlipidemia, unspecified hyperlipidemia type Continue statin as ordered and reviewed, no changes at this time    Current Outpatient Medications on File Prior to Visit  Medication Sig Dispense Refill   amLODipine (NORVASC) 10 MG tablet Take 1 tablet (10 mg total) by mouth daily. 90 tablet 1   atorvastatin (LIPITOR) 10 MG tablet Take 1 tablet (10 mg total) by mouth daily. 90 tablet 3   Cholecalciferol (D3 2000 PO) Take 2 tablets by mouth.     empagliflozin (JARDIANCE) 10 MG TABS tablet Take 1 tablet (10 mg total) by mouth daily before breakfast. 30 tablet 4   esomeprazole (NEXIUM) 40 MG capsule Take 40 mg by mouth every other day.     estradiol (ESTRACE) 0.1 MG/GM  vaginal cream Estrogen Cream Instruction Discard applicator Apply pea sized amount to tip of finger to urethra before bed. Wash hands well after application. Use Monday, Wednesday and Friday 42.5 g 1   gabapentin (NEURONTIN) 800 MG tablet Take 800 mg by mouth 3 (three) times daily.     ibuprofen (ADVIL) 600 MG tablet Take 600 mg by mouth every 6 (six) hours as needed.     Lidocaine (BLUE-EMU PAIN RELIEF DRY EX) Apply topically.     losartan (COZAAR) 100 MG tablet Take 1 tablet (100 mg total) by mouth daily. 30 tablet 4   metoprolol succinate (TOPROL-XL) 25 MG 24 hr tablet Take 1 tablet (25 mg total) by mouth daily. 30 tablet 3   Semaglutide,0.25 or 0.5MG /DOS, (OZEMPIC, 0.25 OR 0.5 MG/DOSE,) 2 MG/1.5ML SOPN Inject 0.25 mg into the skin once a week. Start with 0.25MG  once a week x 4 weeks, then increase to 0.5MG  weekly. 1.5 mL 3   SUPER B COMPLEX/C PO Take by mouth.     tiZANidine (ZANAFLEX) 2 MG tablet Take by mouth every 6 (six) hours as needed for muscle spasms.     Tocilizumab (ACTEMRA IV) Inject into the vein.     No current facility-administered medications on file prior to visit.    There are no Patient Instructions on file for this visit. No follow-ups on file.   Kris Hartmann, NP

## 2021-01-09 ENCOUNTER — Encounter: Payer: Self-pay | Admitting: Gastroenterology

## 2021-01-09 DIAGNOSIS — M1710 Unilateral primary osteoarthritis, unspecified knee: Secondary | ICD-10-CM | POA: Diagnosis not present

## 2021-01-09 DIAGNOSIS — G629 Polyneuropathy, unspecified: Secondary | ICD-10-CM | POA: Diagnosis not present

## 2021-01-09 DIAGNOSIS — G894 Chronic pain syndrome: Secondary | ICD-10-CM | POA: Diagnosis not present

## 2021-01-09 DIAGNOSIS — M47817 Spondylosis without myelopathy or radiculopathy, lumbosacral region: Secondary | ICD-10-CM | POA: Diagnosis not present

## 2021-01-11 ENCOUNTER — Other Ambulatory Visit: Payer: Medicare Other

## 2021-01-12 ENCOUNTER — Other Ambulatory Visit: Payer: Medicare Other

## 2021-01-12 ENCOUNTER — Other Ambulatory Visit: Payer: Self-pay

## 2021-01-12 DIAGNOSIS — E119 Type 2 diabetes mellitus without complications: Secondary | ICD-10-CM | POA: Diagnosis not present

## 2021-01-12 DIAGNOSIS — E785 Hyperlipidemia, unspecified: Secondary | ICD-10-CM | POA: Diagnosis not present

## 2021-01-12 DIAGNOSIS — E669 Obesity, unspecified: Secondary | ICD-10-CM | POA: Diagnosis not present

## 2021-01-12 DIAGNOSIS — Z794 Long term (current) use of insulin: Secondary | ICD-10-CM | POA: Diagnosis not present

## 2021-01-12 LAB — BAYER DCA HB A1C WAIVED: HB A1C (BAYER DCA - WAIVED): 5.4 % (ref 4.8–5.6)

## 2021-01-13 DIAGNOSIS — E785 Hyperlipidemia, unspecified: Secondary | ICD-10-CM

## 2021-01-13 DIAGNOSIS — E119 Type 2 diabetes mellitus without complications: Secondary | ICD-10-CM

## 2021-01-13 DIAGNOSIS — I10 Essential (primary) hypertension: Secondary | ICD-10-CM | POA: Diagnosis not present

## 2021-01-13 DIAGNOSIS — M069 Rheumatoid arthritis, unspecified: Secondary | ICD-10-CM | POA: Diagnosis not present

## 2021-01-13 DIAGNOSIS — Z794 Long term (current) use of insulin: Secondary | ICD-10-CM

## 2021-01-13 LAB — COMPREHENSIVE METABOLIC PANEL
ALT: 81 IU/L — ABNORMAL HIGH (ref 0–32)
AST: 45 IU/L — ABNORMAL HIGH (ref 0–40)
Albumin/Globulin Ratio: 3 — ABNORMAL HIGH (ref 1.2–2.2)
Albumin: 4.8 g/dL (ref 3.8–4.9)
Alkaline Phosphatase: 51 IU/L (ref 44–121)
BUN/Creatinine Ratio: 22 (ref 9–23)
BUN: 17 mg/dL (ref 6–24)
Bilirubin Total: 0.7 mg/dL (ref 0.0–1.2)
CO2: 26 mmol/L (ref 20–29)
Calcium: 9.8 mg/dL (ref 8.7–10.2)
Chloride: 104 mmol/L (ref 96–106)
Creatinine, Ser: 0.76 mg/dL (ref 0.57–1.00)
Globulin, Total: 1.6 g/dL (ref 1.5–4.5)
Glucose: 113 mg/dL — ABNORMAL HIGH (ref 70–99)
Potassium: 4 mmol/L (ref 3.5–5.2)
Sodium: 144 mmol/L (ref 134–144)
Total Protein: 6.4 g/dL (ref 6.0–8.5)
eGFR: 94 mL/min/{1.73_m2} (ref >59)

## 2021-01-13 LAB — CBC WITH DIFFERENTIAL/PLATELET
Basophils Absolute: 0 10*3/uL (ref 0.0–0.2)
Basos: 1 %
EOS (ABSOLUTE): 0.1 10*3/uL (ref 0.0–0.4)
Eos: 1 %
Hematocrit: 47.1 % — ABNORMAL HIGH (ref 34.0–46.6)
Hemoglobin: 16 g/dL — ABNORMAL HIGH (ref 11.1–15.9)
Immature Grans (Abs): 0 10*3/uL (ref 0.0–0.1)
Immature Granulocytes: 0 %
Lymphocytes Absolute: 1.7 10*3/uL (ref 0.7–3.1)
Lymphs: 34 %
MCH: 32.1 pg (ref 26.6–33.0)
MCHC: 34 g/dL (ref 31.5–35.7)
MCV: 94 fL (ref 79–97)
Monocytes Absolute: 0.6 10*3/uL (ref 0.1–0.9)
Monocytes: 12 %
Neutrophils Absolute: 2.6 10*3/uL (ref 1.4–7.0)
Neutrophils: 52 %
Platelets: 207 10*3/uL (ref 150–450)
RBC: 4.99 x10E6/uL (ref 3.77–5.28)
RDW: 14.2 % (ref 11.7–15.4)
WBC: 5.1 10*3/uL (ref 3.4–10.8)

## 2021-01-13 LAB — TSH: TSH: 0.957 u[IU]/mL (ref 0.450–4.500)

## 2021-01-16 ENCOUNTER — Ambulatory Visit (INDEPENDENT_AMBULATORY_CARE_PROVIDER_SITE_OTHER): Payer: Medicare Other

## 2021-01-16 ENCOUNTER — Telehealth: Payer: Medicare Other

## 2021-01-16 DIAGNOSIS — M069 Rheumatoid arthritis, unspecified: Secondary | ICD-10-CM

## 2021-01-16 DIAGNOSIS — M1990 Unspecified osteoarthritis, unspecified site: Secondary | ICD-10-CM

## 2021-01-16 DIAGNOSIS — I10 Essential (primary) hypertension: Secondary | ICD-10-CM

## 2021-01-16 DIAGNOSIS — N39498 Other specified urinary incontinence: Secondary | ICD-10-CM

## 2021-01-16 DIAGNOSIS — E669 Obesity, unspecified: Secondary | ICD-10-CM

## 2021-01-16 DIAGNOSIS — E785 Hyperlipidemia, unspecified: Secondary | ICD-10-CM

## 2021-01-16 DIAGNOSIS — E119 Type 2 diabetes mellitus without complications: Secondary | ICD-10-CM

## 2021-01-16 DIAGNOSIS — N390 Urinary tract infection, site not specified: Secondary | ICD-10-CM

## 2021-01-16 DIAGNOSIS — Z794 Long term (current) use of insulin: Secondary | ICD-10-CM

## 2021-01-16 NOTE — Chronic Care Management (AMB) (Signed)
Chronic Care Management   CCM RN Visit Note  01/16/2021 Name: Elizabeth Patrick MRN: [id number] DOB: 1970-07-03  Subjective: Elizabeth Patrick is a 51 y.o. year old female who is a primary care patient of Vigg, [name], MD. The care management team was consulted for assistance with disease management and care coordination needs.    Engaged with patient by telephone for follow up visit in response to provider referral for case management and/or care coordination services.   Consent to Services:  The patient was given information about Chronic Care Management services, agreed to services, and gave verbal consent prior to initiation of services.  Please see initial visit note for detailed documentation.   Patient agreed to services and verbal consent obtained.   Assessment: Review of patient past medical history, allergies, medications, health status, including review of consultants reports, laboratory and other test data, was performed as part of comprehensive evaluation and provision of chronic care management services.   SDOH (Social Determinants of Health) assessments and interventions performed:    CCM Care Plan  Allergies  Allergen Reactions   Alcohol Anaphylaxis   Dilantin [Phenytoin] Anaphylaxis    Outpatient Encounter Medications as of 01/16/2021  Medication Sig   amLODipine (NORVASC) 10 MG tablet Take 1 tablet (10 mg total) by mouth daily.   atorvastatin (LIPITOR) 10 MG tablet Take 1 tablet (10 mg total) by mouth daily.   Cholecalciferol (D3 2000 PO) Take 2 tablets by mouth.   empagliflozin (JARDIANCE) 10 MG TABS tablet Take 1 tablet (10 mg total) by mouth daily before breakfast.   esomeprazole (NEXIUM) 40 MG capsule Take 40 mg by mouth every other day.   estradiol (ESTRACE) 0.1 MG/GM vaginal cream Estrogen Cream Instruction Discard applicator Apply pea sized amount to tip of finger to urethra before bed. Wash hands well after application. Use Monday, Wednesday and Friday    fenofibrate (TRICOR) 145 MG tablet TAKE 1 TABLET(145 MG) BY MOUTH DAILY   gabapentin (NEURONTIN) 800 MG tablet Take 800 mg by mouth 3 (three) times daily.   ibuprofen (ADVIL) 600 MG tablet Take 600 mg by mouth every 6 (six) hours as needed.   Lidocaine (BLUE-EMU PAIN RELIEF DRY EX) Apply topically.   losartan (COZAAR) 100 MG tablet Take 1 tablet (100 mg total) by mouth daily.   metoprolol succinate (TOPROL-XL) 25 MG 24 hr tablet Take 1 tablet (25 mg total) by mouth daily.   Semaglutide,0.25 or 0.5MG/DOS, (OZEMPIC, 0.25 OR 0.5 MG/DOSE,) 2 MG/1.5ML SOPN Inject 0.25 mg into the skin once a week. Start with 0.25MG once a week x 4 weeks, then increase to 0.5MG weekly.   SUPER B COMPLEX/C PO Take by mouth.   tiZANidine (ZANAFLEX) 2 MG tablet Take by mouth every 6 (six) hours as needed for muscle spasms.   Tocilizumab (ACTEMRA IV) Inject into the vein.   No facility-administered encounter medications on file as of 01/16/2021.    Patient Active Problem List   Diagnosis Date Noted   Type 2 diabetes mellitus without complication, with long-term current use of insulin ([place]) 11/28/2020   Gastroesophageal reflux disease 11/28/2020   Need for influenza vaccination 11/28/2020   Recurrent UTI 10/17/2020   Dysuria 10/17/2020   Absence of bladder continence 10/17/2020   PAD (peripheral artery disease) ([place]) 10/11/2020   Neuropathy 10/11/2020   Hyperlipidemia 10/11/2020   Lymphedema 10/11/2020   Screen for colon cancer 10/10/2020   Screening breast examination 10/10/2020   NASH (nonalcoholic steatohepatitis) 10/10/2020   Rheumatoid arthritis ([place]) 09/26/2020  Primary hypertension 09/26/2020   Osteoarthritis 09/26/2020    Conditions to be addressed/monitored:HTN, HLD, DMII, Osteoarthritis, and obesity, RA, urinary concerns  Care Plan : RNCM: General Plan of Care (Adult) for Chronic Disease Management and Care Coordination Needs  Updates made by Vanita Ingles, RN since 01/16/2021 12:00 AM      Problem: RNCM: Development of plan of care for Chronic Disease Mangement and Care Coordination Needs(HTN, HLD, DM, Obesity, Recurrent UTI with incontinence, RA and OA)   Priority: High  Onset Date: 11/08/2020     Long-Range Goal: RNCM: Effective management of plan of care for Chronic Disease Mangement and Care Coordination Needs(HTN, HLD, DM, Obesity, Recurrent UTI with incontinence, RA and OA)   Start Date: 11/08/2020  Expected End Date: 11/08/2021  Priority: High  Note:   Current Barriers:  Knowledge Deficits related to plan of care for management of HTN, HLD, DMII, and Obesity, Recurrent UTI with urinary incontinence, RA, and OA  Chronic Disease Management support and education needs related to HTN, HLD, DMII, and Obesity, Recurrent UTI with urinary incontinence, RA, and OA   RNCM Clinical Goal(s):  Patient will verbalize understanding of plan for management of HTN, HLD, DMII, Osteoarthritis, and RA, Obesity, Recurrent UTI with urinary incontinence  verbalize basic understanding of HTN, HLD, DMII, Osteoarthritis, and RA, Obesity, Recurrent UTI with urinary incontinence disease process and self health management plan  take all medications exactly as prescribed and will call provider for medication related questions demonstrate understanding of rationale for each prescribed medication  attend all scheduled medical appointments: 01-17-2021 demonstrate improved and ongoing adherence to prescribed treatment plan for HTN, HLD, DMII, Osteoarthritis, and RA, Obesity, recurrent UTI's with urinary incontinence  as evidenced by daily monitoring and recording of CBG  adherence to ADA/ carb modified diet exercise 4/5 days/week adherence to prescribed medication regimen contacting provider for new or worsened symptoms or questions  demonstrate improved and ongoing health management independence  continue to work with Consulting civil engineer and/or Social Worker to address care management and care coordination  needs related to HTN, HLD, DMII, Osteoarthritis, and RA, Obesity, frequent UTI's with urinary incontinence   demonstrate a decrease in HTN, HLD, DMII, and Osteoarthritis exacerbations RA, obesity, and recurrent UTI's with urinary incontinence  demonstrate ongoing self health care management ability to effectively manage chronic conditions through collaboration with RN Care manager, provider, and care team.   Interventions: 1:1 collaboration with primary care provider regarding development and update of comprehensive plan of care as evidenced by provider attestation and co-signature Inter-disciplinary care team collaboration (see longitudinal plan of care) Evaluation of current treatment plan related to  self management and patient's adherence to plan as established by provider   SDOH Barriers (Status: Goal on track: YES.) Long Term Goal (>30 days) Patient interviewed and SDOH assessment performed        SDOH Interventions    Flowsheet Row Most Recent Value  SDOH Interventions   Food Insecurity Interventions Intervention Not Indicated  Financial Strain Interventions Intervention Not Indicated  Housing Interventions Intervention Not Indicated  Intimate Partner Violence Interventions Intervention Not Indicated  Physical Activity Interventions Intervention Not Indicated  Stress Interventions Intervention Not Indicated  Social Connections Interventions Other (Comment)  [moved her from [place], is happy to be in [place]. Great support system]  Transportation Interventions Intervention Not Indicated     Patient interviewed and appropriate assessments performed Provided patient with information about resources available in [facility] and care guides to help with any changes in [facility]  Discussed plans with patient for ongoing care management follow up and provided patient with direct contact information for care management team Advised patient to to call the office for changes in conditions, changes in  SDOH, questions or concerns    Diabetes:  (Status: New goal. Goal on track: YES.) Lab Results  Component Value Date   HGBA1C 5.4 01/12/2021  Assessed patient's understanding of A1c goal: <7% Provided education to patient about basic DM disease process; Reviewed medications with patient and discussed importance of medication adherence. 11-08-2020: The patient is taking Ozempic as prescribed. States she has had some nausea but not bad. Education and support given. 01-16-2021: Endorses compliance with medications        Reviewed prescribed diet with patient heart healthy/ADA diet. 01-16-2021: The patient is interested in a dietician to help with meal planning and choices. Gave the patient the number to   [facility] [place] [phone]. She has relocated and needs a facility closer to where she lives. The patient also agrees to receive the healthy meal planning information by mail. The patient will talk with pcp at visit tomorrow about referral to dietician. Education and support given about meals and eating habits; Counseled on importance of regular laboratory monitoring as prescribed;        Discussed plans with patient for ongoing care management follow up and provided patient with direct contact information for care management team;      Provided patient with written educational materials related to hypo and hyperglycemia and importance of correct treatment. 01-16-2021: Denies any lows at this time;       Reviewed scheduled/upcoming provider appointments including: 01-17-2021         Advised patient, providing education and rationale, to check cbg as directed  and record        call provider for findings outside established parameters;       Review of patient status, including review of consultants reports, relevant laboratory and other test results, and medications completed;       Screening for signs and symptoms of depression related to chronic  disease state;        Assessed social determinant of health barriers;         Recurrent UTI's with urinary incontinence   (Status: Goal on Track (progressing): YES.) Evaluation of current treatment plan related to  Recurrent UTI's with urinary incontience  ,  self-management and patient's adherence to plan as established by provider. 01-16-2021: The patient is seeing the specialist and working with specialist on a consistent basis. The patient denies any new concerns and is following recommendations by the provider Discussed plans with patient for ongoing care management follow up and provided patient with direct contact information for care management team Advised patient to call the office for changes in urinary health, any sx or sx of recurrent infection, keep appointments; Provided education to patient re: education on taking showers other than baths, drinking cranberry juice if recommended by the provider, good hygiene and monitoring for changes in urinary health; Reviewed medications with patient and discussed compliance. The patient is taking an ABX for 3 months and following the recommendations of the urolgogist ; Reviewed scheduled/upcoming provider appointments including 01-17-2021; Discussed plans with patient for ongoing care management follow up and provided patient with direct contact information for care management team;  Hyperlipidemia:  (Status: Goal on Track (progressing): YES.) Lab Results  Component Value Date   CHOL 165 12/12/2020   HDL 61  11/23/2020   LDLCALC 70 11/23/2020   TRIG 101 12/12/2020   CHOLHDL 2.5 11/23/2020     Medication review performed; medication list updated in electronic medical record.  Provider established cholesterol goals reviewed. 01-16-2021: Review of lab work and praised for levels trending down to normal. The patient is actively doing what she can to do better with management of her chronic conditions; Counseled on importance of regular laboratory  monitoring as prescribed; Provided HLD educational materials. 01-16-2021- Sending in formation in mail to the patient on healthy eating. ; Reviewed role and benefits of statin for ASCVD risk reduction; Discussed strategies to manage statin-induced myalgias; Reviewed importance of limiting foods high in cholesterol; Reviewed exercise goals and target of 150 minutes per week. 01-16-2021: The patient is working out with her husband some. She is doing baby steps. She is feeling much better since losing weight. Wants to lose more. Will continue to monitor.   Hypertension: (Status: Goal on Track (progressing): YES.) Last practice recorded BP readings:  BP Readings from Last 3 Encounters:  12/27/20 (!) 143/77  12/12/20 122/76  11/23/20 132/77  Most recent eGFR/CrCl:  Lab Results  Component Value Date   EGFR 94 01/12/2021    No components found for: CRCL  Evaluation of current treatment plan related to hypertension self management and patient's adherence to plan as established by provider. 01-16-2021: The patient is having more normalized blood pressures. The patient is making positive changes in her health and well being. Will continue to monitor ;   Provided education to patient re: stroke prevention, s/s of heart attack and stroke; Reviewed prescribed diet heart healthy/ADA diet  Reviewed medications with patient and discussed importance of compliance;  Counseled on the importance of exercise goals with target of 150 minutes per week Discussed plans with patient for ongoing care management follow up and provided patient with direct contact information for care management team; Advised patient, providing education and rationale, to monitor blood pressure daily and record, calling PCP for findings outside established parameters;  Reviewed scheduled/upcoming provider appointments including: 01-17-2021  Provided education on prescribed diet heart healthy/ADA. 01-16-2021: Review of heart healthy/ADA diet.  Education material to be mailed to the patient.   Discussed complications of poorly controlled blood pressure such as heart disease, stroke, circulatory complications, vision complications, kidney impairment, sexual dysfunction;   Pain: RA and OA:  (Status: Goal on Track (progressing): YES.) Pain assessment performed. 01-16-2021: Patient rates her pain today at a 3 to 4. She states it is not bad today. States when she overdoes it is worse but she is pacing her activity Medications reviewed. 01-16-2021: Is compliant with medications Reviewed provider established plan for pain management. 11-08-2020: The patient has gotten back on schedule with her monthly infusions for her RA. States she can tell a difference. Had injections in bilateral knees last week and was able to walk in the neighborhood with her daughter. She states since moving from [place] to [place] she is feeling better and wanting to take care of her health and well being. States she hurts in her joints all over and it varies.  She uses topical applications to help with pain as well. 01-16-2021: Is doing well and denies any acute findings. Is happy with weight loss and taking ownership of her health.  Discussed importance of adherence to all scheduled medical appointments; Counseled on the importance of reporting any/all new or changed pain symptoms or management strategies to pain management provider; Advised patient to report to care team affect  of pain on daily activities; Discussed use of relaxation techniques and/or diversional activities to assist with pain reduction (distraction, imagery, relaxation, massage, acupressure, TENS, heat, and cold application; Reviewed with patient prescribed pharmacological and nonpharmacological pain relief strategies; Advised patient to discuss changes in level or intensity of pain with provider;   Weight Loss:  (Status: New goal. Goal on Track (progressing): YES.) Advised patient to discuss with primary care  provider options regarding weight management;  Provided verbal and/or written education to patient re: provider recommended life style modifications. 01-16-2021: Mailing copy of healthy eating to the patient. Also the patient is going to call: [facility] [place] [phone]. She will discuss with the pcp tomorrow about referral for dietician help for meal planning and nutrition ;  Screening for signs and symptoms of depression;  Offered to connect patient with psychology or social work support for counseling and supportive care;  Reviewed recommended dietary changes: avoid fad diets, make small/incremental dietary and exercise changes, eat at the table and avoid eating in front of the TV, plan management of cravings, monitor snacking and cravings in food diary;  Patient Goals/Self-Care Activities: Patient will self administer medications as prescribed as evidenced by self report/primary caregiver report  Patient will attend all scheduled provider appointments as evidenced by clinician review of documented attendance to scheduled appointments and patient/caregiver report Patient will call pharmacy for medication refills as evidenced by patient report and review of pharmacy fill history as appropriate Patient will attend church or other social activities as evidenced by patient report Patient will continue to perform ADL's independently as evidenced by patient/caregiver report Patient will continue to perform IADL's independently as evidenced by patient/caregiver report Patient will call provider office for new concerns or questions as evidenced by review of documented incoming telephone call notes and patient report Patient will work with BSW to address care coordination needs and will continue to work with the clinical team to address health care and disease management related needs as evidenced by documented adherence to scheduled care management/care  coordination appointments - schedule appointment with eye doctor - check feet daily for cuts, sores or redness - set goal weight - trim toenails straight across - drink 6 to 8 glasses of water each day - eat fish at least once per week - fill half of plate with vegetables - limit fast food meals to no more than 1 per week - manage portion size - prepare main meal at home 3 to 5 days each week - read food labels for fat, fiber, carbohydrates and portion size - reduce red meat to 2 to 3 times a week - set a realistic goal - switch to low-fat or skim milk - switch to sugar-free drinks - keep feet up while sitting - wash and dry feet carefully every day - wear comfortable, cotton socks - wear comfortable, well-fitting shoes - check blood pressure 3 times per week - choose a place to take my blood pressure (home, clinic or office, retail store) - write blood pressure results in a log or diary - learn about high blood pressure - keep a blood pressure log - take blood pressure log to all doctor appointments - call doctor for signs and symptoms of high blood pressure - develop an action plan for high blood pressure - keep all doctor appointments - take medications for blood pressure exactly as prescribed - report new symptoms to your doctor - eat more whole grains, fruits and vegetables,  lean meats and healthy fats - call for medicine refill 2 or 3 days before it runs out - take all medications exactly as prescribed - call doctor with any symptoms you believe are related to your medicine - call doctor when you experience any new symptoms - go to all doctor appointments as scheduled - adhere to prescribed diet: Heart healthy/ADA - develop an exercise routine       Plan:Telephone follow up appointment with care management team member scheduled for:  03-27-2021 at [place] am  Noreene Larsson RN, MSN, [facility] Mobile: [phone]

## 2021-01-16 NOTE — Patient Instructions (Signed)
Visit Information  Thank you for taking time to visit with me today. Please don't hesitate to contact me if I can be of assistance to you before our next scheduled telephone appointment.  Following are the goals we discussed today:  RNCM Clinical Goal(s):  Patient will verbalize understanding of plan for management of HTN, HLD, DMII, Osteoarthritis, and RA, Obesity, Recurrent UTI with urinary incontinence  verbalize basic understanding of HTN, HLD, DMII, Osteoarthritis, and RA, Obesity, Recurrent UTI with urinary incontinence disease process and self health management plan  take all medications exactly as prescribed and will call provider for medication related questions demonstrate understanding of rationale for each prescribed medication  attend all scheduled medical appointments: 01-17-2021 demonstrate improved and ongoing adherence to prescribed treatment plan for HTN, HLD, DMII, Osteoarthritis, and RA, Obesity, recurrent UTI's with urinary incontinence  as evidenced by daily monitoring and recording of CBG  adherence to ADA/ carb modified diet exercise 4/5 days/week adherence to prescribed medication regimen contacting provider for new or worsened symptoms or questions  demonstrate improved and ongoing health management independence  continue to work with Consulting civil engineer and/or Social Worker to address care management and care coordination needs related to HTN, HLD, DMII, Osteoarthritis, and RA, Obesity, frequent UTI's with urinary incontinence   demonstrate a decrease in HTN, HLD, DMII, and Osteoarthritis exacerbations RA, obesity, and recurrent UTI's with urinary incontinence  demonstrate ongoing self health care management ability to effectively manage chronic conditions through collaboration with RN Care manager, provider, and care team.    Interventions: 1:1 collaboration with primary care provider regarding development and update of comprehensive plan of care as evidenced by provider  attestation and co-signature Inter-disciplinary care team collaboration (see longitudinal plan of care) Evaluation of current treatment plan related to  self management and patient's adherence to plan as established by provider     SDOH Barriers (Status: Goal on track: YES.) Long Term Goal (>30 days) Patient interviewed and SDOH assessment performed        SDOH Interventions     Flowsheet Row Most Recent Value  SDOH Interventions    Food Insecurity Interventions Intervention Not Indicated  Financial Strain Interventions Intervention Not Indicated  Housing Interventions Intervention Not Indicated  Intimate Partner Violence Interventions Intervention Not Indicated  Physical Activity Interventions Intervention Not Indicated  Stress Interventions Intervention Not Indicated  Social Connections Interventions Other (Comment)  [moved her from [place], is happy to be in [place]. Great support system]  Transportation Interventions Intervention Not Indicated       Patient interviewed and appropriate assessments performed Provided patient with information about resources available in [facility] and care guides to help with any changes in SDOH Discussed plans with patient for ongoing care management follow up and provided patient with direct contact information for care management team Advised patient to to call the office for changes in conditions, changes in SDOH, questions or concerns       Diabetes:  (Status: New goal. Goal on track: YES.)      Lab Results  Component Value Date    HGBA1C 5.4 01/12/2021  Assessed patient's understanding of A1c goal: <7% Provided education to patient about basic DM disease process; Reviewed medications with patient and discussed importance of medication adherence. 11-08-2020: The patient is taking Ozempic as prescribed. States she has had some nausea but not bad. Education and support given. 01-16-2021: Endorses compliance with medications        Reviewed  prescribed diet with patient heart healthy/ADA diet. 01-16-2021:  The patient is interested in a dietician to help with meal planning and choices. Gave the patient the number to   [facility] [place] [phone]. She has relocated and needs a facility closer to where she lives. The patient also agrees to receive the healthy meal planning information by mail. The patient will talk with pcp at visit tomorrow about referral to dietician. Education and support given about meals and eating habits; Counseled on importance of regular laboratory monitoring as prescribed;        Discussed plans with patient for ongoing care management follow up and provided patient with direct contact information for care management team;      Provided patient with written educational materials related to hypo and hyperglycemia and importance of correct treatment. 01-16-2021: Denies any lows at this time;       Reviewed scheduled/upcoming provider appointments including: 01-17-2021         Advised patient, providing education and rationale, to check cbg as directed  and record        call provider for findings outside established parameters;       Review of patient status, including review of consultants reports, relevant laboratory and other test results, and medications completed;       Screening for signs and symptoms of depression related to chronic disease state;        Assessed social determinant of health barriers;          Recurrent UTI's with urinary incontinence   (Status: Goal on Track (progressing): YES.) Evaluation of current treatment plan related to  Recurrent UTI's with urinary incontience  ,  self-management and patient's adherence to plan as established by provider. 01-16-2021: The patient is seeing the specialist and working with specialist on a consistent basis. The patient denies any new concerns and is following recommendations by the provider Discussed plans with  patient for ongoing care management follow up and provided patient with direct contact information for care management team Advised patient to call the office for changes in urinary health, any sx or sx of recurrent infection, keep appointments; Provided education to patient re: education on taking showers other than baths, drinking cranberry juice if recommended by the provider, good hygiene and monitoring for changes in urinary health; Reviewed medications with patient and discussed compliance. The patient is taking an ABX for 3 months and following the recommendations of the urolgogist ; Reviewed scheduled/upcoming provider appointments including 01-17-2021; Discussed plans with patient for ongoing care management follow up and provided patient with direct contact information for care management team;   Hyperlipidemia:  (Status: Goal on Track (progressing): YES.)      Lab Results  Component Value Date    CHOL 165 12/12/2020    HDL 61 11/23/2020    LDLCALC 70 11/23/2020    TRIG 101 12/12/2020    CHOLHDL 2.5 11/23/2020      Medication review performed; medication list updated in electronic medical record.  Provider established cholesterol goals reviewed. 01-16-2021: Review of lab work and praised for levels trending down to normal. The patient is actively doing what she can to do better with management of her chronic conditions; Counseled on importance of regular laboratory monitoring as prescribed; Provided HLD educational materials. 01-16-2021- Sending in formation in mail to the patient on healthy eating. ; Reviewed role and benefits of statin for ASCVD risk reduction; Discussed strategies to manage statin-induced myalgias; Reviewed importance of limiting foods high in cholesterol; Reviewed exercise  goals and target of 150 minutes per week. 01-16-2021: The patient is working out with her husband some. She is doing baby steps. She is feeling much better since losing weight. Wants to lose more.  Will continue to monitor.    Hypertension: (Status: Goal on Track (progressing): YES.) Last practice recorded BP readings:     BP Readings from Last 3 Encounters:  12/27/20 (!) 143/77  12/12/20 122/76  11/23/20 132/77  Most recent eGFR/CrCl:       Lab Results  Component Value Date    EGFR 94 01/12/2021    No components found for: CRCL   Evaluation of current treatment plan related to hypertension self management and patient's adherence to plan as established by provider. 01-16-2021: The patient is having more normalized blood pressures. The patient is making positive changes in her health and well being. Will continue to monitor ;   Provided education to patient re: stroke prevention, s/s of heart attack and stroke; Reviewed prescribed diet heart healthy/ADA diet  Reviewed medications with patient and discussed importance of compliance;  Counseled on the importance of exercise goals with target of 150 minutes per week Discussed plans with patient for ongoing care management follow up and provided patient with direct contact information for care management team; Advised patient, providing education and rationale, to monitor blood pressure daily and record, calling PCP for findings outside established parameters;  Reviewed scheduled/upcoming provider appointments including: 01-17-2021  Provided education on prescribed diet heart healthy/ADA. 01-16-2021: Review of heart healthy/ADA diet. Education material to be mailed to the patient.   Discussed complications of poorly controlled blood pressure such as heart disease, stroke, circulatory complications, vision complications, kidney impairment, sexual dysfunction;    Pain: RA and OA:  (Status: Goal on Track (progressing): YES.) Pain assessment performed. 01-16-2021: Patient rates her pain today at a 3 to 4. She states it is not bad today. States when she overdoes it is worse but she is pacing her activity Medications reviewed. 01-16-2021: Is compliant  with medications Reviewed provider established plan for pain management. 11-08-2020: The patient has gotten back on schedule with her monthly infusions for her RA. States she can tell a difference. Had injections in bilateral knees last week and was able to walk in the neighborhood with her daughter. She states since moving from [place] to [place] she is feeling better and wanting to take care of her health and well being. States she hurts in her joints all over and it varies.  She uses topical applications to help with pain as well. 01-16-2021: Is doing well and denies any acute findings. Is happy with weight loss and taking ownership of her health.  Discussed importance of adherence to all scheduled medical appointments; Counseled on the importance of reporting any/all new or changed pain symptoms or management strategies to pain management provider; Advised patient to report to care team affect of pain on daily activities; Discussed use of relaxation techniques and/or diversional activities to assist with pain reduction (distraction, imagery, relaxation, massage, acupressure, TENS, heat, and cold application; Reviewed with patient prescribed pharmacological and nonpharmacological pain relief strategies; Advised patient to discuss changes in level or intensity of pain with provider;     Weight Loss:  (Status: New goal. Goal on Track (progressing): YES.) Advised patient to discuss with primary care provider options regarding weight management;  Provided verbal and/or written education to patient re: provider recommended life style modifications. 01-16-2021: Mailing copy of healthy eating to the patient. Also the patient is going  to call: [facility] at [address] [place] [phone]. She will discuss with the pcp tomorrow about referral for dietician help for meal planning and nutrition ;  Screening for signs and symptoms of depression;  Offered to connect patient with  psychology or social work support for counseling and supportive care;  Reviewed recommended dietary changes: avoid fad diets, make small/incremental dietary and exercise changes, eat at the table and avoid eating in front of the TV, plan management of cravings, monitor snacking and cravings in food diary;   Patient Goals/Self-Care Activities: Patient will self administer medications as prescribed as evidenced by self report/primary caregiver report  Patient will attend all scheduled provider appointments as evidenced by clinician review of documented attendance to scheduled appointments and patient/caregiver report Patient will call pharmacy for medication refills as evidenced by patient report and review of pharmacy fill history as appropriate Patient will attend church or other social activities as evidenced by patient report Patient will continue to perform ADL's independently as evidenced by patient/caregiver report Patient will continue to perform IADL's independently as evidenced by patient/caregiver report Patient will call provider office for new concerns or questions as evidenced by review of documented incoming telephone call notes and patient report Patient will work with BSW to address care coordination needs and will continue to work with the clinical team to address health care and disease management related needs as evidenced by documented adherence to scheduled care management/care coordination appointments - schedule appointment with eye doctor - check feet daily for cuts, sores or redness - set goal weight - trim toenails straight across - drink 6 to 8 glasses of water each day - eat fish at least once per week - fill half of plate with vegetables - limit fast food meals to no more than 1 per week - manage portion size - prepare main meal at home 3 to 5 days each week - read food labels for fat, fiber, carbohydrates and portion size - reduce red meat to 2 to 3 times a week -  set a realistic goal - switch to low-fat or skim milk - switch to sugar-free drinks - keep feet up while sitting - wash and dry feet carefully every day - wear comfortable, cotton socks - wear comfortable, well-fitting shoes - check blood pressure 3 times per week - choose a place to take my blood pressure (home, clinic or office, retail store) - write blood pressure results in a log or diary - learn about high blood pressure - keep a blood pressure log - take blood pressure log to all doctor appointments - call doctor for signs and symptoms of high blood pressure - develop an action plan for high blood pressure - keep all doctor appointments - take medications for blood pressure exactly as prescribed - report new symptoms to your doctor - eat more whole grains, fruits and vegetables, lean meats and healthy fats - call for medicine refill 2 or 3 days before it runs out - take all medications exactly as prescribed - call doctor with any symptoms you believe are related to your medicine - call doctor when you experience any new symptoms - go to all doctor appointments as scheduled - adhere to prescribed diet: Heart healthy/ADA - develop an exercise routine  Our next appointment is by telephone on 03-27-2021 at 0945 am  Please call the care guide team at [phone] if you need to cancel or reschedule your appointment.   If you are experiencing  a [place] or [place] or need someone to talk to, please call the Suicide and Crisis Lifeline: 988 call the [place] National Suicide Prevention Lifeline: 414-040-2401 or TTY: 289-756-7604 TTY (931)644-7390) to talk to a trained counselor call 1-800-273-TALK (toll free, 24 hour hotline)   The patient verbalized understanding of instructions, educational materials, and care plan provided today and declined offer to receive copy of patient instructions, educational materials, and care plan.   Noreene Larsson RN, MSN, [facility] Mobile: [phone]

## 2021-01-17 ENCOUNTER — Other Ambulatory Visit: Payer: Self-pay

## 2021-01-17 ENCOUNTER — Encounter: Payer: Self-pay | Admitting: Internal Medicine

## 2021-01-17 ENCOUNTER — Ambulatory Visit (INDEPENDENT_AMBULATORY_CARE_PROVIDER_SITE_OTHER): Payer: Medicare Other | Admitting: Internal Medicine

## 2021-01-17 VITALS — BP 121/74 | HR 56 | Temp 98.1°F | Ht 64.17 in | Wt 349.0 lb

## 2021-01-17 DIAGNOSIS — K439 Ventral hernia without obstruction or gangrene: Secondary | ICD-10-CM | POA: Diagnosis not present

## 2021-01-17 MED ORDER — TRULICITY 3 MG/0.5ML ~~LOC~~ SOAJ: 3.0000 mg | SUBCUTANEOUS | 6 refills | Status: DC

## 2021-01-17 MED ORDER — TRIAMCINOLONE ACETONIDE 0.1 % EX OINT: 1.0000 "application " | TOPICAL_OINTMENT | Freq: Three times a day (TID) | CUTANEOUS | 2 refills | Status: DC

## 2021-01-17 MED ORDER — FUROSEMIDE 20 MG PO TABS: 20.0000 mg | ORAL_TABLET | Freq: Every day | ORAL | 3 refills | Status: DC

## 2021-01-17 NOTE — Patient Instructions (Signed)
Latest Reference Range & Units 11/17/20 09:22 01/12/21 08:51  AST 0 - 40 IU/L 24 45 (H)  ALT 0 - 32 IU/L 40 (H) 81 (H)  (H): Data is abnormally high

## 2021-01-17 NOTE — Progress Notes (Signed)
BP 121/74    Pulse (!) 56    Temp 98.1 F (36.7 C) (Oral)    Ht 5' 4.17" (1.63 m)    Wt (!) 349 lb (158.3 kg)    SpO2 98%    BMI 59.58 kg/m    Subjective:    [name], female    DOB: 12-07-1970, 51 y.o.   MRN: [id number]  Chief Complaint  Patient presents with   Diabetes   Hypertension   Gastroesophageal Reflux   Hyperlipidemia   lymphedema    HPI: Elizabeth Patrick is a 51 y.o. female  Pt has seen vascular surgery and she has had abnl   Diabetes She presents for her follow-up diabetic visit. She has type 2 diabetes mellitus. Her disease course has been worsening. Pertinent negatives for hypoglycemia include no headaches. Pertinent negatives for diabetes include no blurred vision, no chest pain, no fatigue, no foot paresthesias, no foot ulcerations, no polydipsia, no polyphagia and no polyuria.  Hypertension This is a chronic problem. The current episode started more than 1 month ago. The problem is controlled. Pertinent negatives include no anxiety, blurred vision, chest pain, headaches, malaise/fatigue, neck pain, orthopnea or palpitations.  Gastroesophageal Reflux She reports no abdominal pain, no chest pain, no early satiety or no globus sensation. Pertinent negatives include no fatigue.  Hyperlipidemia This is a chronic problem. The problem is controlled. Pertinent negatives include no chest pain.   Chief Complaint  Patient presents with   Diabetes   Hypertension   Gastroesophageal Reflux   Hyperlipidemia   lymphedema    Relevant past medical, surgical, family and social history reviewed and updated as indicated. Interim medical history since our last visit reviewed. Allergies and medications reviewed and updated.  Review of Systems  Constitutional:  Negative for fatigue and malaise/fatigue.  Eyes:  Negative for blurred vision.  Cardiovascular:  Negative for chest pain, palpitations and orthopnea.  Gastrointestinal:  Negative for  abdominal pain.  Endocrine: Negative for polydipsia, polyphagia and polyuria.  Musculoskeletal:  Negative for neck pain.  Neurological:  Negative for headaches.   Per HPI unless specifically indicated above     Objective:    BP 121/74    Pulse (!) 56    Temp 98.1 F (36.7 C) (Oral)    Ht 5' 4.17" (1.63 m)    Wt (!) 349 lb (158.3 kg)    SpO2 98%    BMI 59.58 kg/m   Wt Readings from Last 3 Encounters:  01/17/21 (!) 349 lb (158.3 kg)  12/27/20 (!) 347 lb 9.6 oz (157.7 kg)  12/12/20 (!) 357 lb (161.9 kg)    Physical Exam  Results for orders placed or performed in visit on 01/12/21  Bayer DCA Hb A1c Waived (STAT)  Result Value Ref Range   HB A1C (BAYER DCA - WAIVED) 5.4 4.8 - 5.6 %  TSH  Result Value Ref Range   TSH 0.957 0.450 - 4.500 uIU/mL  Comprehensive metabolic panel  Result Value Ref Range   Glucose 113 (H) 70 - 99 mg/dL   BUN 17 6 - 24 mg/dL   Creatinine, Ser 0.76 0.57 - 1.00 mg/dL   eGFR 94 >59 mL/min/1.73   BUN/Creatinine Ratio 22 9 - 23   Sodium 144 134 - 144 mmol/L   Potassium 4.0 3.5 - 5.2 mmol/L   Chloride 104 96 - 106 mmol/L   CO2 26 20 - 29 mmol/L   Calcium 9.8 8.7 - 10.2 mg/dL  Total Protein 6.4 6.0 - 8.5 g/dL   Albumin 4.8 3.8 - 4.9 g/dL   Globulin, Total 1.6 1.5 - 4.5 g/dL   Albumin/Globulin Ratio 3.0 (H) 1.2 - 2.2   Bilirubin Total 0.7 0.0 - 1.2 mg/dL   Alkaline Phosphatase 51 44 - 121 IU/L   AST 45 (H) 0 - 40 IU/L   ALT 81 (H) 0 - 32 IU/L  CBC with Differential/Platelet  Result Value Ref Range   WBC 5.1 3.4 - 10.8 x10E3/uL   RBC 4.99 3.77 - 5.28 x10E6/uL   Hemoglobin 16.0 (H) 11.1 - 15.9 g/dL   Hematocrit 47.1 (H) 34.0 - 46.6 %   MCV 94 79 - 97 fL   MCH 32.1 26.6 - 33.0 pg   MCHC 34.0 31.5 - 35.7 g/dL   RDW 14.2 11.7 - 15.4 %   Platelets 207 150 - 450 x10E3/uL   Neutrophils 52 Not Estab. %   Lymphs 34 Not Estab. %   Monocytes 12 Not Estab. %   Eos 1 Not Estab. %   Basos 1 Not Estab. %   Neutrophils Absolute 2.6 1.4 - 7.0 x10E3/uL    Lymphocytes Absolute 1.7 0.7 - 3.1 x10E3/uL   Monocytes Absolute 0.6 0.1 - 0.9 x10E3/uL   EOS (ABSOLUTE) 0.1 0.0 - 0.4 x10E3/uL   Basophils Absolute 0.0 0.0 - 0.2 x10E3/uL   Immature Granulocytes 0 Not Estab. %   Immature Grans (Abs) 0.0 0.0 - 0.1 x10E3/uL        Current Outpatient Medications:    amLODipine (NORVASC) 10 MG tablet, Take 1 tablet (10 mg total) by mouth daily., Disp: 90 tablet, Rfl: 1   atorvastatin (LIPITOR) 10 MG tablet, Take 1 tablet (10 mg total) by mouth daily., Disp: 90 tablet, Rfl: 3   Cholecalciferol (D3 2000 PO), Take 2 tablets by mouth., Disp: , Rfl:    esomeprazole (NEXIUM) 40 MG capsule, Take 40 mg by mouth every other day., Disp: , Rfl:    estradiol (ESTRACE) 0.1 MG/GM vaginal cream, Estrogen Cream Instruction Discard applicator Apply pea sized amount to tip of finger to urethra before bed. Wash hands well after application. Use Monday, Wednesday and Friday, Disp: 42.5 g, Rfl: 1   fenofibrate (TRICOR) 145 MG tablet, TAKE 1 TABLET(145 MG) BY MOUTH DAILY, Disp: 90 tablet, Rfl: 0   gabapentin (NEURONTIN) 800 MG tablet, Take 800 mg by mouth 3 (three) times daily., Disp: , Rfl:    ibuprofen (ADVIL) 600 MG tablet, Take 600 mg by mouth every 6 (six) hours as needed., Disp: , Rfl:    Lidocaine (BLUE-EMU PAIN RELIEF DRY EX), Apply topically., Disp: , Rfl:    losartan (COZAAR) 100 MG tablet, Take 1 tablet (100 mg total) by mouth daily., Disp: 30 tablet, Rfl: 4   metoprolol succinate (TOPROL-XL) 25 MG 24 hr tablet, Take 1 tablet (25 mg total) by mouth daily., Disp: 30 tablet, Rfl: 3   Semaglutide,0.25 or 0.5MG/DOS, (OZEMPIC, 0.25 OR 0.5 MG/DOSE,) 2 MG/1.5ML SOPN, Inject 0.25 mg into the skin once a week. Start with 0.25MG once a week x 4 weeks, then increase to 0.5MG weekly., Disp: 1.5 mL, Rfl: 3   SUPER B COMPLEX/C PO, Take by mouth., Disp: , Rfl:    tiZANidine (ZANAFLEX) 2 MG tablet, Take by mouth every 6 (six) hours as needed for muscle spasms., Disp: , Rfl:     Tocilizumab (ACTEMRA IV), Inject into the vein., Disp: , Rfl:    empagliflozin (JARDIANCE) 10 MG TABS tablet, Take 1 tablet (  10 mg total) by mouth daily before breakfast. (Patient not taking: Reported on 01/17/2021), Disp: 30 tablet, Rfl: 4    Assessment & Plan:   DM/ obesity :  A1c better . check HbA1c,  urine  microalbumin  diabetic diet plan given to pt  adviced regarding hypoglycemia and instructions given to pt today on how to prevent and treat the same if it were to occur. pt acknowledges the plan and voices understanding of the same.  exercise plan given and encouraged.   advice diabetic yearly podiatry, ophthalmology , nutritionist , dental check q 6 months,   Latest Reference Range & Units 11/17/20 10:08 12/12/20 14:36 01/12/21 08:48 01/12/21 08:51  Glucose 70 - 99 mg/dL  117 (H)  113 (H)  Hemoglobin A1C 4.8 - 5.6 % 6.0 (H)     HB A1C (BAYER DCA - WAIVED) 4.8 - 5.6 %   5.4   (H): Data is abnormally high  Lower extremity edema  Will start pt on lasix 20 mg prn daily.    Obesity/ Dm is on 0.5 mg once a week of such  Weight loss of 42 lbs  Pt and Dr. Wilhemena Durie for a procedure for weight loss.  NASH - ALT higher   Latest Reference Range & Units 11/17/20 09:22 01/12/21 08:51  AST 0 - 40 IU/L 24 45 (H)  ALT 0 - 32 IU/L 40 (H) 81 (H)  (H): Data is abnormally high   RA sees [place] : is on actemra  ? LFTS rising sec to such   Abdominal mass : worsening To fu with Dr Vicente Males  Rash on abdomen / lower ext.  Start kenalog   Problem List Items Addressed This Visit   None    No orders of the defined types were placed in this encounter.    No orders of the defined types were placed in this encounter.    Follow up plan: No follow-ups on file.

## 2021-01-18 ENCOUNTER — Encounter: Payer: Self-pay | Admitting: Internal Medicine

## 2021-01-19 ENCOUNTER — Telehealth: Payer: Self-pay

## 2021-01-19 NOTE — Telephone Encounter (Signed)
Dr. Vicente Patrick, patient called stating that she went to see her PCP-Dr. Neomia Dear and he stated that her labs were significantly elevated since last time. Therefore, he wanted you to look at his notes and to review the labs again. Patient also stated that she had an abdominal bulge that she was concerned about. 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 Vicente Patrick. Patient started noticing it since she started exercising. Please advise.

## 2021-01-22 ENCOUNTER — Telehealth: Payer: Medicare Other

## 2021-01-22 ENCOUNTER — Telehealth: Payer: Self-pay

## 2021-01-22 NOTE — Telephone Encounter (Signed)
Called patient back and told her the below information. Patient agreed and had no further questions.

## 2021-01-22 NOTE — Telephone Encounter (Signed)
Patient called to reschedule procedure till 03/12/2021 vanga I called endo sent new referral to nicole  and sent new communications

## 2021-01-22 NOTE — Telephone Encounter (Signed)
PA started for Trulicity thorough cover my meds, awaiting determination

## 2021-01-22 NOTE — Telephone Encounter (Signed)
PA for Trulicity 0.1TA has been approved.

## 2021-02-01 ENCOUNTER — Other Ambulatory Visit: Payer: Self-pay

## 2021-02-01 ENCOUNTER — Encounter: Payer: Self-pay | Admitting: Urology

## 2021-02-01 ENCOUNTER — Ambulatory Visit: Payer: Medicare Other | Admitting: Urology

## 2021-02-01 VITALS — BP 149/82 | HR 60 | Ht 64.0 in | Wt 343.7 lb

## 2021-02-01 DIAGNOSIS — N390 Urinary tract infection, site not specified: Secondary | ICD-10-CM | POA: Diagnosis not present

## 2021-02-01 DIAGNOSIS — N941 Unspecified dyspareunia: Secondary | ICD-10-CM | POA: Diagnosis not present

## 2021-02-01 MED ORDER — ESTRADIOL 0.1 MG/GM VA CREA: TOPICAL_CREAM | VAGINAL | 11 refills | Status: DC

## 2021-02-01 NOTE — Progress Notes (Signed)
° °  02/01/2021 1:51 PM   Elizabeth Patrick [birth year] [id number]  Reason for visit: Follow up recurrent UTIs, urinary symptoms, dyspareunia   HPI: Comorbid 51 year old female with morbid obesity(BMI 59) and rheumatoid arthritis on immunosuppression and diabetes who presented in October 2022 after developing 2 culture positive UTIs in the last 2 months.  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  At our last visit I recommended 90 days of nitrofurantoin prophylaxis, as well as starting topical estrogen cream.  With her history of complex abdominal hysterectomy and fistula, I also ordered a CT to evaluate for any obstruction or other etiology of recurrent infections.  I personally viewed and interpreted the CT dated 12/15/2020 that shows no hydronephrosis or urologic abnormalities  She has been doing very well since her last visit he denies any UTI since that time.  She feels the topical estrogen has helped with some of the discomfort and burning.  She recently resumed sexual activity and does report some dyspareunia, but that has continued to improve, and they are using lubrication.  I recommended considering vaginal dilators for gentle dilation if she resumes sexual activity, and continuing the topical estrogen cream.  -Continue topical estrogen cream -RTC 1 year symptom check, likely can follow-up as needed at that time and PCP can fill topical estrogen if doing well    [place], [place] [address], [place], [place] [address] [phone]

## 2021-02-01 NOTE — Patient Instructions (Signed)
Urinary Tract Infection, Adult A urinary tract infection (UTI) is an infection of any part of the urinary tract. The urinary tract includes the kidneys, ureters, bladder, and urethra. These organs make, store, and get rid of urine in the body. An upper UTI affects the ureters and kidneys. A lower UTI affects the bladder and urethra. What are the causes? Most urinary tract infections are caused by bacteria in your genital area around your urethra, where urine leaves your body. These bacteria grow and cause inflammation of your urinary tract. What increases the risk? You are more likely to develop this condition if: You have a urinary catheter that stays in place. You are not able to control when you urinate or have a bowel movement (incontinence). You are female and you: Use a spermicide or diaphragm for birth control. Have low estrogen levels. Are pregnant. You have certain genes that increase your risk. You are sexually active. You take antibiotic medicines. You have a condition that causes your flow of urine to slow down, such as: An enlarged prostate, if you are female. Blockage in your urethra. A kidney stone. A nerve condition that affects your bladder control (neurogenic bladder). Not getting enough to drink, or not urinating often. You have certain medical conditions, such as: Diabetes. A weak disease-fighting system (immunesystem). Sickle cell disease. Gout. Spinal cord injury. What are the signs or symptoms? Symptoms of this condition include: Needing to urinate right away (urgency). Frequent urination. This may include small amounts of urine each time you urinate. Pain or burning with urination. Blood in the urine. Urine that smells bad or unusual. Trouble urinating. Cloudy urine. Vaginal discharge, if you are female. Pain in the abdomen or the lower back. You may also have: Vomiting or a decreased appetite. Confusion. Irritability or tiredness. A fever or  chills. Diarrhea. The first symptom in older adults may be confusion. In some cases, they may not have any symptoms until the infection has worsened. How is this diagnosed? This condition is diagnosed based on your medical history and a physical exam. You may also have other tests, including: Urine tests. Blood tests. Tests for STIs (sexually transmitted infections). If you have had more than one UTI, a cystoscopy or imaging studies may be done to determine the cause of the infections. How is this treated? Treatment for this condition includes: Antibiotic medicine. Over-the-counter medicines to treat discomfort. Drinking enough water to stay hydrated. If you have frequent infections or have other conditions such as a kidney stone, you may need to see a health care provider who specializes in the urinary tract (urologist). In rare cases, urinary tract infections can cause sepsis. Sepsis is a life-threatening condition that occurs when the body responds to an infection. Sepsis is treated in the hospital with IV antibiotics, fluids, and other medicines. Follow these instructions at home: Medicines Take over-the-counter and prescription medicines only as told by your health care provider. If you were prescribed an antibiotic medicine, take it as told by your health care provider. Do not stop using the antibiotic even if you start to feel better. General instructions Make sure you: Empty your bladder often and completely. Do not hold urine for long periods of time. Empty your bladder after sex. Wipe from front to back after urinating or having a bowel movement if you are female. Use each tissue only one time when you wipe. Drink enough fluid to keep your urine pale yellow. Keep all follow-up visits. This is important. Contact a health care provider   if: Your symptoms do not get better after 1-2 days. Your symptoms go away and then return. Get help right away if: You have severe pain in your  back or your lower abdomen. You have a fever or chills. You have nausea or vomiting. Summary A urinary tract infection (UTI) is an infection of any part of the urinary tract, which includes the kidneys, ureters, bladder, and urethra. Most urinary tract infections are caused by bacteria in your genital area. Treatment for this condition often includes antibiotic medicines. If you were prescribed an antibiotic medicine, take it as told by your health care provider. Do not stop using the antibiotic even if you start to feel better. Keep all follow-up visits. This is important. This information is not intended to replace advice given to you by your health care provider. Make sure you discuss any questions you have with your health care provider. Document Revised: 08/13/2019 Document Reviewed: 08/13/2019 Elsevier Patient Education  2022 Elsevier Inc.  

## 2021-02-07 DIAGNOSIS — Z79899 Other long term (current) drug therapy: Secondary | ICD-10-CM | POA: Diagnosis not present

## 2021-02-07 DIAGNOSIS — M0609 Rheumatoid arthritis without rheumatoid factor, multiple sites: Secondary | ICD-10-CM | POA: Diagnosis not present

## 2021-02-12 ENCOUNTER — Other Ambulatory Visit: Payer: Self-pay

## 2021-02-12 ENCOUNTER — Ambulatory Visit (HOSPITAL_BASED_OUTPATIENT_CLINIC_OR_DEPARTMENT_OTHER)
Admission: RE | Admit: 2021-02-12 | Discharge: 2021-02-12 | Disposition: A | Discharge status: Home / Self Care | Payer: Medicare Other | Source: Ambulatory Visit | Attending: Internal Medicine | Admitting: Internal Medicine

## 2021-02-12 ENCOUNTER — Encounter (HOSPITAL_BASED_OUTPATIENT_CLINIC_OR_DEPARTMENT_OTHER): Payer: Self-pay

## 2021-02-12 DIAGNOSIS — Z1231 Encounter for screening mammogram for malignant neoplasm of breast: Secondary | ICD-10-CM | POA: Insufficient documentation

## 2021-02-13 DIAGNOSIS — M1990 Unspecified osteoarthritis, unspecified site: Secondary | ICD-10-CM

## 2021-02-13 DIAGNOSIS — E119 Type 2 diabetes mellitus without complications: Secondary | ICD-10-CM

## 2021-02-13 DIAGNOSIS — M069 Rheumatoid arthritis, unspecified: Secondary | ICD-10-CM

## 2021-02-13 DIAGNOSIS — I10 Essential (primary) hypertension: Secondary | ICD-10-CM

## 2021-02-13 DIAGNOSIS — Z794 Long term (current) use of insulin: Secondary | ICD-10-CM

## 2021-02-13 DIAGNOSIS — Z87891 Personal history of nicotine dependence: Secondary | ICD-10-CM

## 2021-02-13 DIAGNOSIS — E785 Hyperlipidemia, unspecified: Secondary | ICD-10-CM

## 2021-02-14 ENCOUNTER — Other Ambulatory Visit: Payer: Self-pay | Admitting: Internal Medicine

## 2021-02-14 DIAGNOSIS — G629 Polyneuropathy, unspecified: Secondary | ICD-10-CM

## 2021-02-14 DIAGNOSIS — E669 Obesity, unspecified: Secondary | ICD-10-CM

## 2021-02-14 DIAGNOSIS — I10 Essential (primary) hypertension: Secondary | ICD-10-CM

## 2021-02-14 NOTE — Telephone Encounter (Signed)
Requested Prescriptions  Pending Prescriptions Disp Refills   losartan (COZAAR) 100 MG tablet [Pharmacy Med Name: LOSARTAN 100MG  TABLETS] 90 tablet     Sig: TAKE 1 TABLET(100 MG) BY MOUTH DAILY     Cardiovascular:  Angiotensin Receptor Blockers Failed - 02/14/2021 10:10 AM      Failed - Last BP in normal range    BP Readings from Last 1 Encounters:  02/01/21 (!) 149/82         Passed - Cr in normal range and within 180 days    Creatinine, Ser  Date Value Ref Range Status  01/12/2021 0.76 0.57 - 1.00 mg/dL Final         Passed - K in normal range and within 180 days    Potassium  Date Value Ref Range Status  01/12/2021 4.0 3.5 - 5.2 mmol/L Final         Passed - Patient is not pregnant      Passed - Valid encounter within last 6 months    Recent Outpatient Visits          4 weeks ago Hernia of abdominal wall   [name] Vigg, [name], MD   2 months ago Need for influenza vaccination   [name] Vigg, [name], MD   4 months ago [place] Vigg, [name], MD   4 months ago NASH (nonalcoholic steatohepatitis)   [place] Vigg, [name], MD   4 months ago Rheumatoid arthritis, involving unspecified site, unspecified whether rheumatoid factor present ([place])   [place], [name], MD      Future Appointments            In 2 weeks Vigg, [name], MD [facility], [place]   In 2 weeks Agbor-Etang, Aaron Edelman, MD [facility], [place]   In 1 month [name], MD [place]

## 2021-02-22 ENCOUNTER — Telehealth: Payer: Self-pay

## 2021-02-22 ENCOUNTER — Ambulatory Visit: Payer: Medicare Other | Admitting: Surgery

## 2021-02-22 ENCOUNTER — Telehealth: Payer: Self-pay | Admitting: Internal Medicine

## 2021-02-22 NOTE — Telephone Encounter (Signed)
Patient dropped off [employer] patient assistance paperwork for provider to review/sign.  This is for assistance with Ozempic medication.  Patient states that this was discussed at prior appointment with Dr. Neomia Dear.  Please fax completed paperwork to [phone].  If any questions, you may contact patient at [phone].  Placed in provider's folder.

## 2021-02-22 NOTE — Telephone Encounter (Signed)
Patient called stating that Dr. Vicente Males wanted her to have her blood rechecked. I then looked at her chart and Dr. Vicente Males wanted her ceruloplasmin rechecked in 6-8 weeks. I told patient that I would be placing the order and that she could go to a [facility] location and have it drawn. Patient agreed.

## 2021-02-23 NOTE — Telephone Encounter (Signed)
Spoke with patient and let her know that Dr.Vigg has completed the requested paperwork and patient is asking if the paperwork was faxed over to provided fax number. Please advise?

## 2021-02-26 NOTE — Telephone Encounter (Signed)
Patient was called and informed that paperwork was faxed to [employer]

## 2021-02-27 ENCOUNTER — Other Ambulatory Visit: Payer: Self-pay | Admitting: Internal Medicine

## 2021-02-28 ENCOUNTER — Other Ambulatory Visit: Payer: Self-pay | Admitting: Internal Medicine

## 2021-02-28 NOTE — Telephone Encounter (Signed)
Leave for PCP review, as she has Trulicity on list ordered by PCP

## 2021-02-28 NOTE — Telephone Encounter (Signed)
Requested medications are due for refill today.  unsure  Requested medications are on the active medications list.  yes  Last refill. 01/20/2021  Future visit scheduled.   yes  Notes to clinic.  Medication listed as historical.    Requested Prescriptions  Pending Prescriptions Disp Refills   OZEMPIC, 0.25 OR 0.5 MG/DOSE, 2 MG/1.5ML SOPN [Pharmacy Med Name: OZEMPIC 0.25 OR 0.5MG /DOS 1X2MG  PEN] 1.5 mL     Sig: INJECT 0.25 MG UNDER THE SKIN ONCE A WEEK FOR 4 WEEKS, THEN INCREASE TO 0.5 MG WEEKLY     Endocrinology:  Diabetes - GLP-1 Receptor Agonists - semaglutide Passed - 02/28/2021  3:20 PM      Passed - HBA1C in normal range and within 180 days    HB A1C (BAYER DCA - WAIVED)  Date Value Ref Range Status  01/12/2021 5.4 4.8 - 5.6 % Final    Comment:             Prediabetes: 5.7 - 6.4          Diabetes: >6.4          Glycemic control for adults with diabetes: <7.0           Passed - Cr in normal range and within 360 days    Creatinine, Ser  Date Value Ref Range Status  01/12/2021 0.76 0.57 - 1.00 mg/dL Final          Passed - Valid encounter within last 6 months    Recent Outpatient Visits           1 month ago Hernia of abdominal wall   [name] Vigg, [name], MD   3 months ago Need for influenza vaccination   [name] Vigg, [name], MD   4 months ago [place] Vigg, [name], MD   4 months ago NASH (nonalcoholic steatohepatitis)   [place] Vigg, [name], MD   5 months ago Rheumatoid arthritis, involving unspecified site, unspecified whether rheumatoid factor present ([place])   [place], [name], MD       Future Appointments             In 5 days Vigg, [name], MD [facility], [place]   In 5 days Agbor-Etang, Aaron Edelman, MD [facility], [place]   In 1 month [name], MD [place]

## 2021-02-28 NOTE — Telephone Encounter (Signed)
Requested medications are due for refill today.  unsure  Requested medications are on the active medications list.  yes  Last refill. 01/20/2021  Future visit scheduled.   yes  Notes to clinic.  Historical medication - historical provider    Requested Prescriptions  Pending Prescriptions Disp Refills   OZEMPIC, 0.25 OR 0.5 MG/DOSE, 2 MG/1.5ML SOPN [Pharmacy Med Name: OZEMPIC 0.25 OR 0.5MG /DOS 1X2MG  PEN] 1.5 mL     Sig: INJECT 0.25 MG UNDER THE SKIN ONCE A WEEK FOR 4 WEEKS, THEN INCREASE TO 0.5 MG WEEKLY     Endocrinology:  Diabetes - GLP-1 Receptor Agonists - semaglutide Passed - 02/27/2021  6:48 PM      Passed - HBA1C in normal range and within 180 days    HB A1C (BAYER DCA - WAIVED)  Date Value Ref Range Status  01/12/2021 5.4 4.8 - 5.6 % Final    Comment:             Prediabetes: 5.7 - 6.4          Diabetes: >6.4          Glycemic control for adults with diabetes: <7.0           Passed - Cr in normal range and within 360 days    Creatinine, Ser  Date Value Ref Range Status  01/12/2021 0.76 0.57 - 1.00 mg/dL Final          Passed - Valid encounter within last 6 months    Recent Outpatient Visits           1 month ago Hernia of abdominal wall   [name] Vigg, [name], MD   3 months ago Need for influenza vaccination   [name] Vigg, [name], MD   4 months ago [place] Vigg, [name], MD   4 months ago NASH (nonalcoholic steatohepatitis)   [place] Vigg, [name], MD   5 months ago Rheumatoid arthritis, involving unspecified site, unspecified whether rheumatoid factor present ([place])   [place], [name], MD       Future Appointments             In 5 days Vigg, [name], MD [facility], [place]   In 5 days Agbor-Etang, Aaron Edelman, MD [facility], [place]   In 1 month [name], MD [place]

## 2021-02-28 NOTE — Telephone Encounter (Signed)
Appointment scheduled for 03/05/2021 with PCP.

## 2021-03-02 ENCOUNTER — Telehealth: Payer: Self-pay | Admitting: Internal Medicine

## 2021-03-02 NOTE — Telephone Encounter (Signed)
Copied from [place] [id number]. Topic: General - Other >> Mar 02, 2021 10:48 AM Tessa Lerner A wrote: Reason for CRM: The patient would like to speak with a member of staff when possible  The patient is unable to locate a pharmacy that is carrying either Ozempic or Trulicity   The patient is due to take their next injection tomorrow 03/03/21  Please contact further when possible >> Mar 02, 2021  2:52 PM Street, [place] L wrote: Was able to help the pt along with Santiago Glad. Santiago Glad let pt know that we have a sample of Ozempic and she will be in to pick it up on Monday.

## 2021-03-03 ENCOUNTER — Other Ambulatory Visit: Payer: Self-pay | Admitting: Internal Medicine

## 2021-03-03 DIAGNOSIS — E669 Obesity, unspecified: Secondary | ICD-10-CM

## 2021-03-03 DIAGNOSIS — I10 Essential (primary) hypertension: Secondary | ICD-10-CM

## 2021-03-03 DIAGNOSIS — G629 Polyneuropathy, unspecified: Secondary | ICD-10-CM

## 2021-03-05 ENCOUNTER — Encounter: Payer: Self-pay | Admitting: Cardiology

## 2021-03-05 ENCOUNTER — Ambulatory Visit: Payer: Medicare Other | Admitting: Cardiology

## 2021-03-05 ENCOUNTER — Other Ambulatory Visit: Payer: Self-pay

## 2021-03-05 ENCOUNTER — Encounter: Payer: Self-pay | Admitting: Internal Medicine

## 2021-03-05 ENCOUNTER — Ambulatory Visit (INDEPENDENT_AMBULATORY_CARE_PROVIDER_SITE_OTHER): Payer: Medicare Other | Admitting: Internal Medicine

## 2021-03-05 VITALS — BP 104/68 | HR 62 | Temp 98.7°F | Ht 64.02 in | Wt 340.8 lb

## 2021-03-05 VITALS — BP 116/70 | HR 65 | Ht 64.0 in | Wt 314.0 lb

## 2021-03-05 DIAGNOSIS — I10 Essential (primary) hypertension: Secondary | ICD-10-CM

## 2021-03-05 DIAGNOSIS — R0602 Shortness of breath: Secondary | ICD-10-CM

## 2021-03-05 DIAGNOSIS — M069 Rheumatoid arthritis, unspecified: Secondary | ICD-10-CM

## 2021-03-05 DIAGNOSIS — I890 Lymphedema, not elsewhere classified: Secondary | ICD-10-CM

## 2021-03-05 DIAGNOSIS — I8393 Asymptomatic varicose veins of bilateral lower extremities: Secondary | ICD-10-CM

## 2021-03-05 DIAGNOSIS — E119 Type 2 diabetes mellitus without complications: Secondary | ICD-10-CM | POA: Diagnosis not present

## 2021-03-05 DIAGNOSIS — Z8679 Personal history of other diseases of the circulatory system: Secondary | ICD-10-CM | POA: Diagnosis not present

## 2021-03-05 DIAGNOSIS — R0683 Snoring: Secondary | ICD-10-CM | POA: Diagnosis not present

## 2021-03-05 DIAGNOSIS — E785 Hyperlipidemia, unspecified: Secondary | ICD-10-CM | POA: Diagnosis not present

## 2021-03-05 NOTE — Telephone Encounter (Signed)
Requested medication (s) are due for refill today:   Yes  Requested medication (s) are on the active medication list:   Yes  Future visit scheduled:   Has one today at 10:00 with Dr. Neomia Dear   Last ordered: 09/26/2020 #30, 3 refills     Requested Prescriptions  Pending Prescriptions Disp Refills   metoprolol succinate (TOPROL-XL) 25 MG 24 hr tablet [Pharmacy Med Name: METOPROLOL ER SUCCINATE 25MG  TABS] 30 tablet 3    Sig: TAKE 1 TABLET(25 MG) BY MOUTH DAILY     Cardiovascular:  Beta Blockers Failed - 03/03/2021  8:07 AM      Failed - Last BP in normal range    BP Readings from Last 1 Encounters:  02/01/21 (!) 149/82          Passed - Last Heart Rate in normal range    Pulse Readings from Last 1 Encounters:  02/01/21 60          Passed - Valid encounter within last 6 months    Recent Outpatient Visits           1 month ago Hernia of abdominal wall   [name] Vigg, [name], MD   3 months ago Need for influenza vaccination   [place] Vigg, [name], MD   4 months ago [place] Vigg, [name], MD   4 months ago NASH (nonalcoholic steatohepatitis)   [name] Vigg, [name], MD   5 months ago Rheumatoid arthritis, involving unspecified site, unspecified whether rheumatoid factor present ([place])   [place] Vigg, [name], MD       Future Appointments             Today Vigg, [name], MD [facility], [place]   Today Kate Sable, MD [facility], [place]   In 1 month [name], MD [place]

## 2021-03-05 NOTE — Progress Notes (Signed)
Cardiology Office Note:    Date:  03/05/2021   ID:  Elizabeth Patrick, DOB 06-16-1970, MRN [id number]  PCP:  Charlynne Cousins, MD   [facility] Providers Cardiologist:  None     Referring MD: Charlynne Cousins, MD   Chief Complaint  Patient presents with   New Patient (Initial Visit)    Referred by PCP for HTN and Swelling in legs. Meds reviewed verbally with patient.      History of Present Illness:    Elizabeth Patrick is a 51 y.o. female with a hx of hypertension, hyperlipidemia, diabetes, morbid obesity, edema who presents due to lower extremity edema.   Patient states having lower extremity edema for some time now.  States left leg is usually more swollen than right.  Diagnosed with lymphedema years ago, was seen at a lymphedema clinic in [place] without much improvement in symptoms.  Over the past several weeks, she has noticed left-sided leg swelling, also notes varicose veins.  Had a lower extremity arterial ultrasound with no significant obstruction.  Denies chest pain, but endorses shortness of breath.  Also endorsed snoring, daytime fatigue and somnolence.  Past Medical History:  Diagnosis Date   RA (rheumatoid arthritis) ([place])     Past Surgical History:  Procedure Laterality Date   ABDOMINAL HYSTERECTOMY      Current Medications: Current Meds  Medication Sig   amLODipine (NORVASC) 10 MG tablet 1 tablet   atorvastatin (LIPITOR) 10 MG tablet Take 1 tablet (10 mg total) by mouth daily.   Cholecalciferol (D3 2000 PO) Take 2 tablets by mouth.   Cranberry 1000 MG CAPS    esomeprazole (NEXIUM) 40 MG capsule Take 40 mg by mouth every other day.   estradiol (ESTRACE) 0.1 MG/GM vaginal cream Estrogen Cream Instruction Discard applicator Apply pea sized amount to tip of finger to urethra before bed. Wash hands well after application. Use Monday, Wednesday and Friday   fenofibrate (TRICOR) 145 MG tablet TAKE 1 TABLET(145 MG) BY MOUTH DAILY   furosemide (LASIX) 20 MG tablet  Take 20 mg by mouth as needed.   gabapentin (NEURONTIN) 800 MG tablet Take 800 mg by mouth 3 (three) times daily.   Lidocaine (BLUE-EMU PAIN RELIEF DRY EX) Apply topically.   losartan (COZAAR) 100 MG tablet TAKE 1 TABLET(100 MG) BY MOUTH DAILY   metoprolol succinate (TOPROL-XL) 25 MG 24 hr tablet TAKE 1 TABLET(25 MG) BY MOUTH DAILY   OZEMPIC, 0.25 OR 0.5 MG/DOSE, 2 MG/1.5ML SOPN Inject into the skin.   SUPER B COMPLEX/C PO Take by mouth.   tiZANidine (ZANAFLEX) 2 MG tablet Take by mouth every 6 (six) hours as needed for muscle spasms.   Tocilizumab (ACTEMRA IV) Inject into the vein.   triamcinolone ointment (KENALOG) 0.1 % Apply 1 application topically 3 (three) times daily.     Allergies:   Alcohol and Dilantin [phenytoin]   Social History   Socioeconomic History   Marital status: Married    Spouse name: Not on file   Number of children: Not on file   Years of education: Not on file   Highest education level: Not on file  Occupational History   Not on file  Tobacco Use   Smoking status: Never   Smokeless tobacco: Never  Substance and Sexual Activity   Alcohol use: Not Currently   Drug use: Never   Sexual activity: Not Currently    Partners: Male    Birth control/protection: Surgical  Other Topics Concern   Not on file  Social History Narrative   Not on file   Social Determinants of Health   Financial Resource Strain: Low Risk    Difficulty of Paying Living Expenses: Not hard at all  Food Insecurity: No Food Insecurity   Worried About Charity fundraiser in the Last Year: Never true   Arboriculturist in the Last Year: Never true  Transportation Needs: No Transportation Needs   Lack of Transportation (Medical): No   Lack of Transportation (Non-Medical): No  Physical Activity: Sufficiently Active   Days of Exercise per Week: 5 days   Minutes of Exercise per Session: 50 min  Stress: No Stress Concern Present   Feeling of Stress : Only a little  Social Connections:  Moderately Isolated   Frequency of Communication with Friends and Family: More than three times a week   Frequency of Social Gatherings with Friends and Family: More than three times a week   Attends Religious Services: Never   Marine scientist or Organizations: No   Attends Music therapist: Never   Marital Status: Married     Family History: The patient's family history includes Bipolar disorder in her mother; Coronary artery disease in her mother; Heart attack in her father; Hypertension in her father; Multiple sclerosis in her mother; Obesity in her mother; Vascular Disease in her father.  ROS:   Please see the history of present illness.     All other systems reviewed and are negative.  EKGs/Labs/Other Studies Reviewed:    The following studies were reviewed today:   EKG:  EKG is  ordered today.  The ekg ordered today demonstrates sinus rhythm, frequent PVCs.  Recent Labs: 01/12/2021: ALT 81; BUN 17; Creatinine, Ser 0.76; Hemoglobin 16.0; Platelets 207; Potassium 4.0; Sodium 144; TSH 0.957  Recent Lipid Panel    Component Value Date/Time   CHOL 165 12/12/2020 1436   TRIG 101 12/12/2020 1436   HDL 61 11/23/2020 1202   CHOLHDL 2.5 11/23/2020 1202   LDLCALC 70 11/23/2020 1202     Risk Assessment/Calculations:          Physical Exam:    VS:  BP 116/70 (BP Location: Left Arm, Patient Position: Sitting, Cuff Size: Large)    Pulse 65    Ht 5\' 4"  (1.626 m)    Wt (!) 314 lb (142.4 kg)    SpO2 96%    BMI 53.90 kg/m     Wt Readings from Last 3 Encounters:  03/05/21 (!) 314 lb (142.4 kg)  03/05/21 (!) 340 lb 12.8 oz (154.6 kg)  02/01/21 (!) 343 lb 11.2 oz (155.9 kg)     GEN:  Well nourished, well developed in no acute distress HEENT: Normal NECK: No JVD; No carotid bruits LYMPHATICS: No lymphadenopathy CARDIAC: No murmurs, occasional skipped heartbeats, regular. RESPIRATORY:  Clear to auscultation without rales, wheezing or rhonchi  ABDOMEN: Soft,  non-tender, non-distended MUSCULOSKELETAL: Lower extremities leg swelling consistent with lymphedema noted, varicose veins noted SKIN: Warm and dry NEUROLOGIC:  Alert and oriented x 3 PSYCHIATRIC:  Normal affect   ASSESSMENT:    1. Snoring   2. Lymphedema   3. Asymptomatic varicose veins of both lower extremities   4. H/O mitral valve prolapse   5. Morbid obesity ([place])   6. Primary hypertension   7. Shortness of breath    PLAN:    In order of problems listed above:  Snoring, daytime fatigue and somnolence.  Patient likely has sleep apnea, referral to sleep medicine  for OSA eval and treatment. Lower extremity leg swelling consistent with lymphedema, referred to lymphedema clinic surgery. Varicose veins, obtain lower extremity ultrasound with reflux to evaluate venous insufficiency, DVT. History of mitral valve prolapse, obtain echocardiogram. Morbid obesity, lost 40 pounds so far.  Continue low calorie diet, weight loss. Hypertension, BP controlled.  Continue current meds.  Follow-up after echo and lower extremity venous ultrasound.       Medication Adjustments/Labs and Tests Ordered: Current medicines are reviewed at length with the patient today.  Concerns regarding medicines are outlined above.  Orders Placed This Encounter  Procedures   Ambulatory referral to Pulmonology   EKG 12-Lead   ECHOCARDIOGRAM COMPLETE   VAS [place] LOWER EXTREMITY VENOUS REFLUX   No orders of the defined types were placed in this encounter.   Patient Instructions  Medication Instructions:  - Your physician recommends that you continue on your current medications as directed. Please refer to the Current Medication list given to you today.  *If you need a refill on your cardiac medications before your next appointment, please call your pharmacy*   Lab Work: - none ordered  If you have labs (blood work) drawn today and your tests are completely normal, you will receive your results only  by: [place] (if you have MyChart) OR A paper copy in the mail If you have any lab test that is abnormal or we need to change your treatment, we will call you to review the results.   Testing/Procedures:  1) Echocardiogram: - Your physician has requested that you have an echocardiogram. Echocardiography is a painless test that uses sound waves to create images of your heart. It provides your doctor with information about the size and shape of your heart and how well your hearts chambers and valves are working. This procedure takes approximately one hour. There are no restrictions for this procedure. There is a possibility that an IV may need to be started during your test to inject an image enhancing agent. This is done to obtain more optimal pictures of your heart. Therefore we ask that you do at least drink some water prior to coming in to hydrate your veins.    2) Lower Extremity Venous Reflux Duplex: Your physician has requested that you have a lower extremity venous reflux duplex. This test is an ultrasound of the veins in the legs. It looks at venous blood flow that carries blood from the heart to the leg. Allow one hour for a Lower Venous exam. There are no restrictions or special instructions.   3) You have been referred to : [facility]- for possible sleep apnea - The Pulmonary office will call you directly to set up an appointment. However, if you have not heard from their office within 2 weeks, please call them directly to follow up at [phone]   4) You have been referred to: [facility] - We will need to forward your referral to their clinic, and they should reach out to you with an appointment. However, if you do not hear from them within 1 week, please call them directly at [phone] to follow up.    Follow-Up: At [facility], you and your health needs are our priority.  As part of our continuing mission to provide you with exceptional  heart care, we have created designated Provider Care Teams.  These Care Teams include your primary Cardiologist (physician) and Advanced Practice Providers (APPs -  Physician Assistants and Nurse Practitioners) who all work  together to provide you with the care you need, when you need it.  We recommend signing up for the patient portal called "MyChart".  Sign up information is provided on this After Visit Summary.  MyChart is used to connect with patients for Virtual Visits (Telemedicine).  Patients are able to view lab/test results, encounter notes, upcoming appointments, etc.  Non-urgent messages can be sent to your provider as well.   To learn more about what you can do with MyChart, go to [email or web address].    Your next appointment:   6 week(s)/ after the echocardiogram is completed  The format for your next appointment:   In Person  Provider:   Kate Sable, MD    Other Instructions N/a    Signed, Kate Sable, MD  03/05/2021 4:18 PM    [place]

## 2021-03-05 NOTE — Progress Notes (Signed)
BP 104/68    Pulse 62    Temp 98.7 F (37.1 C) (Oral)    Ht 5' 4.02" (1.626 m)    Wt (!) 340 lb 12.8 oz (154.6 kg)    SpO2 98%    BMI 58.47 kg/m    Subjective:    [name], female    DOB: [birth year], 51 y.o.   MRN: [id number]  Chief Complaint  Patient presents with   Diabetes   Hyperlipidemia   Gastroesophageal Reflux    HPI: Elizabeth Patrick is a 51 y.o. female  Obesity - weight was 340 lbs this visit Wt Readings from Last 3 Encounters: 03/05/21 : (!) 340 lb 12.8 oz (154.6 kg) 02/01/21 : (!) 343 lb 11.2 oz (155.9 kg) 01/17/21 : (!) 349 lb (158.3 kg)    Diabetes She presents for her follow-up diabetic visit. She has type 2 diabetes mellitus. Her disease course has been improving. There are no diabetic associated symptoms.  Hyperlipidemia This is a chronic problem. The current episode started more than 1 year ago. The problem is uncontrolled.  Gastroesophageal Reflux   Chief Complaint  Patient presents with   Diabetes   Hyperlipidemia   Gastroesophageal Reflux    Relevant past medical, surgical, family and social history reviewed and updated as indicated. Interim medical history since our last visit reviewed. Allergies and medications reviewed and updated.  Review of Systems  Per HPI unless specifically indicated above     Objective:    BP 104/68    Pulse 62    Temp 98.7 F (37.1 C) (Oral)    Ht 5' 4.02" (1.626 m)    Wt (!) 340 lb 12.8 oz (154.6 kg)    SpO2 98%    BMI 58.47 kg/m   Wt Readings from Last 3 Encounters:  03/05/21 (!) 340 lb 12.8 oz (154.6 kg)  02/01/21 (!) 343 lb 11.2 oz (155.9 kg)  01/17/21 (!) 349 lb (158.3 kg)    Physical Exam  Results for orders placed or performed in visit on 01/18/21  CBC and differential  Result Value Ref Range   Hemoglobin 15.8 12.0 - 16.0   HCT 46 36 - 46   Platelets 211 150 - 399   WBC 5.7   CBC  Result Value Ref Range   RBC 4.96 3.87 - 2.54  Basic metabolic panel  Result Value Ref Range    Glucose 119   Comprehensive metabolic panel  Result Value Ref Range   Calcium 10.1 8.7 - 10.7  Hepatic function panel  Result Value Ref Range   Bilirubin, Total 0.7         Current Outpatient Medications:    amLODipine (NORVASC) 10 MG tablet, Take 1 tablet (10 mg total) by mouth daily., Disp: 90 tablet, Rfl: 1   atorvastatin (LIPITOR) 10 MG tablet, Take 1 tablet (10 mg total) by mouth daily., Disp: 90 tablet, Rfl: 3   Cholecalciferol (D3 2000 PO), Take 2 tablets by mouth., Disp: , Rfl:    Cranberry 1000 MG CAPS, , Disp: , Rfl:    esomeprazole (NEXIUM) 40 MG capsule, Take 40 mg by mouth every other day., Disp: , Rfl:    estradiol (ESTRACE) 0.1 MG/GM vaginal cream, Estrogen Cream Instruction Discard applicator Apply pea sized amount to tip of finger to urethra before bed. Wash hands well after application. Use Monday, Wednesday and Friday, Disp: 42.5 g, Rfl: 11   fenofibrate (TRICOR) 145 MG tablet, TAKE 1 TABLET(145 MG) BY MOUTH  DAILY, Disp: 90 tablet, Rfl: 0   gabapentin (NEURONTIN) 800 MG tablet, Take 800 mg by mouth 3 (three) times daily., Disp: , Rfl:    ibuprofen (ADVIL) 600 MG tablet, Take 600 mg by mouth every 6 (six) hours as needed., Disp: , Rfl:    Lidocaine (BLUE-EMU PAIN RELIEF DRY EX), Apply topically., Disp: , Rfl:    losartan (COZAAR) 100 MG tablet, TAKE 1 TABLET(100 MG) BY MOUTH DAILY, Disp: 90 tablet, Rfl: 0   metoprolol succinate (TOPROL-XL) 25 MG 24 hr tablet, TAKE 1 TABLET(25 MG) BY MOUTH DAILY, Disp: 30 tablet, Rfl: 3   OZEMPIC, 0.25 OR 0.5 MG/DOSE, 2 MG/1.5ML SOPN, Inject into the skin., Disp: , Rfl:    SUPER B COMPLEX/C PO, Take by mouth., Disp: , Rfl:    tiZANidine (ZANAFLEX) 2 MG tablet, Take by mouth every 6 (six) hours as needed for muscle spasms., Disp: , Rfl:    Tocilizumab (ACTEMRA IV), Inject into the vein., Disp: , Rfl:    triamcinolone ointment (KENALOG) 0.1 %, Apply 1 application topically 3 (three) times daily., Disp: 30 g, Rfl: 2   amLODipine  (NORVASC) 10 MG tablet, 1 tablet, Disp: , Rfl:    Dulaglutide (TRULICITY) 3 TK/1.6WF SOPN, Inject 3 mg as directed once a week., Disp: 0.5 mL, Rfl: 6   furosemide (LASIX) 20 MG tablet, Take 1 tablet (20 mg total) by mouth daily., Disp: 30 tablet, Rfl: 3    Assessment & Plan:  Dyspareunia  : Estrogen creams help with this  Seen urology for such  Recommendations per above.  2. DM/ obesity is on trulicity temporarily sec to non availabity of ozempic a1c at 5.4 check HbA1c,  urine  microalbumin  diabetic diet plan given to pt  adviced regarding hypoglycemia and instructions given to pt today on how to prevent and treat the same if it were to occur. pt acknowledges the plan and voices understanding of the same.  exercise plan given and encouraged.   advice diabetic yearly podiatry, ophthalmology , nutritionist , dental check q 6 months,  3. NASH seen Dr. Wilhemena Durie Lfts stable, sees rheum for  RA Fu and mx per such   4. Back pain : got a recumbent bike and sits back on it and exercise.  Pain mx to fu with this.  5. HLD : is on liptior 10 mg daily Lab Results  Component Value Date   LDLCALC 73 03/05/2021    recheck FLP, check LFT's work on diet, SE of meds explained to pt. low fat and high fiber diet explained to pt.  Problem List Items Addressed This Visit   None    No orders of the defined types were placed in this encounter.    No orders of the defined types were placed in this encounter.    Follow up plan: No follow-ups on file.

## 2021-03-05 NOTE — Patient Instructions (Addendum)
Medication Instructions:  - Your physician recommends that you continue on your current medications as directed. Please refer to the Current Medication list given to you today.  *If you need a refill on your cardiac medications before your next appointment, please call your pharmacy*   Lab Work: - none ordered  If you have labs (blood work) drawn today and your tests are completely normal, you will receive your results only by: [place] (if you have MyChart) OR A paper copy in the mail If you have any lab test that is abnormal or we need to change your treatment, we will call you to review the results.   Testing/Procedures:  1) Echocardiogram: - Your physician has requested that you have an echocardiogram. Echocardiography is a painless test that uses sound waves to create images of your heart. It provides your doctor with information about the size and shape of your heart and how well your hearts chambers and valves are working. This procedure takes approximately one hour. There are no restrictions for this procedure. There is a possibility that an IV may need to be started during your test to inject an image enhancing agent. This is done to obtain more optimal pictures of your heart. Therefore we ask that you do at least drink some water prior to coming in to hydrate your veins.    2) Lower Extremity Venous Reflux Duplex: Your physician has requested that you have a lower extremity venous reflux duplex. This test is an ultrasound of the veins in the legs. It looks at venous blood flow that carries blood from the heart to the leg. Allow one hour for a Lower Venous exam. There are no restrictions or special instructions.   3) You have been referred to :  Pulmonary- for possible sleep apnea - The Pulmonary office will call you directly to set up an appointment. However, if you have not heard from their office within 2 weeks, please call them directly to follow up at [phone]  [phone]   4) You have been referred to: [facility] - We will need to forward your referral to their clinic, and they should reach out to you with an appointment. However, if you do not hear from them within 1 week, please call them directly at [phone] to follow up.    Follow-Up: At [facility], you and your health needs are our priority.  As part of our continuing mission to provide you with exceptional heart care, we have created designated Provider Care Teams.  These Care Teams include your primary Cardiologist (physician) and Advanced Practice Providers (APPs -  Physician Assistants and Nurse Practitioners) who all work together to provide you with the care you need, when you need it.  We recommend signing up for the patient portal called "MyChart".  Sign up information is provided on this After Visit Summary.  MyChart is used to connect with patients for Virtual Visits (Telemedicine).  Patients are able to view lab/test results, encounter notes, upcoming appointments, etc.  Non-urgent messages can be sent to your provider as well.   To learn more about what you can do with MyChart, go to [email or web address].    Your next appointment:   6 week(s)/ after the echocardiogram is completed  The format for your next appointment:   In Person  Provider:   Kate Sable, MD    Other Instructions N/a

## 2021-03-06 LAB — LIPID PANEL
Chol/HDL Ratio: 2.5 ratio (ref 0.0–4.4)
Cholesterol, Total: 151 mg/dL (ref 100–199)
HDL: 61 mg/dL (ref >39)
LDL Chol Calc (NIH): 73 mg/dL (ref 0–99)
Triglycerides: 89 mg/dL (ref 0–149)
VLDL Cholesterol Cal: 17 mg/dL (ref 5–40)

## 2021-03-06 NOTE — Progress Notes (Signed)
Please let pt know this was normal.

## 2021-03-09 DIAGNOSIS — M47816 Spondylosis without myelopathy or radiculopathy, lumbar region: Secondary | ICD-10-CM | POA: Diagnosis not present

## 2021-03-12 ENCOUNTER — Telehealth: Payer: Self-pay | Admitting: Internal Medicine

## 2021-03-12 ENCOUNTER — Telehealth: Payer: Self-pay | Admitting: Cardiology

## 2021-03-12 ENCOUNTER — Encounter: Admission: RE | Payer: Self-pay | Source: Ambulatory Visit

## 2021-03-12 ENCOUNTER — Ambulatory Visit: Admission: RE | Admit: 2021-03-12 | Payer: Medicare Other | Source: Ambulatory Visit | Admitting: Gastroenterology

## 2021-03-12 SURGERY — COLONOSCOPY WITH PROPOFOL
Anesthesia: General

## 2021-03-12 NOTE — Telephone Encounter (Signed)
I think this was sent in can ypu pl check thnx

## 2021-03-12 NOTE — Telephone Encounter (Signed)
Patient seen in office on 03/05/21 with Dr. Garen Lah. MD requested the patient be referred to the [facility].  Called the [facility] scheduling line today to inquire a fax # for this referral to be sent to.  Per [facility] scheduling- the referral needs to be sent to:  [facility] Fax: [phone] Phone: [phone]  Office note/ demographics/ medication list faxed to the # above. Fax confirmation received.

## 2021-03-12 NOTE — Telephone Encounter (Signed)
Copied from [place] [phone]. Topic: General - Other >> Mar 12, 2021  2:23 PM Antonieta Iba C wrote: Reason for CRM: pt called in for assistance. Pt says that provider has been assisting with paperwork for Ozympic. Pt says that she received a letter stating more information is needed such as  a providers signature and the dosage . Pt says that she is almost out of her medicine and would like further assistance

## 2021-03-14 DIAGNOSIS — M0609 Rheumatoid arthritis without rheumatoid factor, multiple sites: Secondary | ICD-10-CM | POA: Diagnosis not present

## 2021-03-14 DIAGNOSIS — Z79899 Other long term (current) drug therapy: Secondary | ICD-10-CM | POA: Diagnosis not present

## 2021-03-16 NOTE — Telephone Encounter (Signed)
Copied from [place] [phone]. Topic: General - Other ?>> Mar 16, 2021 12:41 PM Yvette Rack wrote: ?Reason for CRM: Pt stated she received a letter that the patient assistance paperwork regarding the Ozempic was incomplete. Pt requests call back as the corrected paperwork is time sensitive. Cb# [phone] ?

## 2021-03-16 NOTE — Telephone Encounter (Signed)
Routing to pharmacist.  ?

## 2021-03-19 NOTE — Telephone Encounter (Signed)
Pt called in again waiting on status of pt assistance letter ?

## 2021-03-20 ENCOUNTER — Telehealth: Payer: Self-pay | Admitting: Internal Medicine

## 2021-03-20 NOTE — Telephone Encounter (Signed)
Copied from [place] [phone]. Topic: General - Other ?>> Mar 16, 2021 12:41 PM Yvette Rack wrote: ?Reason for CRM: Pt stated she received a letter that the patient assistance paperwork regarding the Ozempic was incomplete. Pt requests call back as the corrected paperwork is time sensitive. Cb# [phone] ?>> Mar 20, 2021 10:11 AM Yvette Rack wrote: ?Pt called once again for an update regarding the patient assistance paperwork. Cb# [phone] ?

## 2021-03-21 ENCOUNTER — Telehealth: Payer: Self-pay

## 2021-03-21 NOTE — Progress Notes (Signed)
I have faxed over the missing information to [employer] at [phone]. Sent the physicans signature and prescribed script that was scanned in patients chart. I have attempted to call the patient to give her an update but I left her a message to return phone call. ? ?Corrie Mckusick, RMA ?Health Concierge ? ?

## 2021-03-27 ENCOUNTER — Ambulatory Visit (INDEPENDENT_AMBULATORY_CARE_PROVIDER_SITE_OTHER): Payer: Medicare Other

## 2021-03-27 ENCOUNTER — Telehealth: Payer: Self-pay

## 2021-03-27 DIAGNOSIS — E119 Type 2 diabetes mellitus without complications: Secondary | ICD-10-CM

## 2021-03-27 DIAGNOSIS — N390 Urinary tract infection, site not specified: Secondary | ICD-10-CM

## 2021-03-27 DIAGNOSIS — M069 Rheumatoid arthritis, unspecified: Secondary | ICD-10-CM

## 2021-03-27 DIAGNOSIS — E785 Hyperlipidemia, unspecified: Secondary | ICD-10-CM

## 2021-03-27 DIAGNOSIS — I10 Essential (primary) hypertension: Secondary | ICD-10-CM

## 2021-03-27 DIAGNOSIS — N39498 Other specified urinary incontinence: Secondary | ICD-10-CM

## 2021-03-27 DIAGNOSIS — M1990 Unspecified osteoarthritis, unspecified site: Secondary | ICD-10-CM

## 2021-03-27 NOTE — Chronic Care Management (AMB) (Signed)
?Chronic Care Management  ? ?CCM RN Visit Note ? ?03/27/2021 ?Name: Elizabeth Patrick MRN: [id number] DOB: [birth year] ? ?Subjective: ?Elizabeth Patrick is a 51 y.o. year old female who is a primary care patient of Vigg, [name], MD. The care management team was consulted for assistance with disease management and care coordination needs.   ? ?Engaged with patient by telephone for follow up visit in response to provider referral for case management and/or care coordination services.  ? ?Consent to Services:  ?The patient was given information about Chronic Care Management services, agreed to services, and gave verbal consent prior to initiation of services.  Please see initial visit note for detailed documentation.  ? ?Patient agreed to services and verbal consent obtained.  ? ?Assessment: Review of patient past medical history, allergies, medications, health status, including review of consultants reports, laboratory and other test data, was performed as part of comprehensive evaluation and provision of chronic care management services.  ? ?SDOH (Social Determinants of Health) assessments and interventions performed:   ? ?CCM Care Plan ? ?Allergies  ?Allergen Reactions  ? Alcohol Anaphylaxis  ? Dilantin [Phenytoin] Anaphylaxis  ? ? ?Outpatient Encounter Medications as of 03/27/2021  ?Medication Sig  ? amLODipine (NORVASC) 10 MG tablet 1 tablet  ? atorvastatin (LIPITOR) 10 MG tablet Take 1 tablet (10 mg total) by mouth daily.  ? Cholecalciferol (D3 2000 PO) Take 2 tablets by mouth.  ? Cranberry 1000 MG CAPS   ? esomeprazole (NEXIUM) 40 MG capsule Take 40 mg by mouth every other day.  ? estradiol (ESTRACE) 0.1 MG/GM vaginal cream Estrogen Cream Instruction Discard applicator Apply pea sized amount to tip of finger to urethra before bed. Wash hands well after application. Use Monday, Wednesday and Friday  ? fenofibrate (TRICOR) 145 MG tablet TAKE 1 TABLET(145 MG) BY MOUTH DAILY  ? furosemide (LASIX) 20 MG tablet Take 20 mg  by mouth as needed.  ? gabapentin (NEURONTIN) 800 MG tablet Take 800 mg by mouth 3 (three) times daily.  ? Lidocaine (BLUE-EMU PAIN RELIEF DRY EX) Apply topically.  ? losartan (COZAAR) 100 MG tablet TAKE 1 TABLET(100 MG) BY MOUTH DAILY  ? metoprolol succinate (TOPROL-XL) 25 MG 24 hr tablet TAKE 1 TABLET(25 MG) BY MOUTH DAILY  ? OZEMPIC, 0.25 OR 0.5 MG/DOSE, 2 MG/1.5ML SOPN Inject into the skin.  ? SUPER B COMPLEX/C PO Take by mouth.  ? tiZANidine (ZANAFLEX) 2 MG tablet Take by mouth every 6 (six) hours as needed for muscle spasms.  ? Tocilizumab (ACTEMRA IV) Inject into the vein.  ? triamcinolone ointment (KENALOG) 0.1 % Apply 1 application topically 3 (three) times daily.  ? ?No facility-administered encounter medications on file as of 03/27/2021.  ? ? ?Patient Active Problem List  ? Diagnosis Date Noted  ? Diabetes mellitus without complication ([place]) 67/20/9470  ? Gastroesophageal reflux disease 11/28/2020  ? Need for influenza vaccination 11/28/2020  ? Recurrent UTI 10/17/2020  ? PAD (peripheral artery disease) ([place]) 10/11/2020  ? Neuropathy 10/11/2020  ? Hyperlipidemia 10/11/2020  ? Lymphedema 10/11/2020  ? Screen for colon cancer 10/10/2020  ? Screening breast examination 10/10/2020  ? NASH (nonalcoholic steatohepatitis) 10/10/2020  ? Rheumatoid arthritis ([place]) 09/26/2020  ? Primary hypertension 09/26/2020  ? Osteoarthritis 09/26/2020  ? ? ?Conditions to be addressed/monitored:HTN, HLD, DMII, and Obesity, RN, OA, recurrent UTI with incontinence  ? ?Care Plan : RNCM: General Plan of Care (Adult) for Chronic Disease Management and Care Coordination Needs  ?Updates made by Vanita Ingles, RN since  03/27/2021 12:00 AM  ?  ? ?Problem: RNCM: Development of plan of care for Chronic Disease Mangement and Care Coordination Needs(HTN, HLD, DM, Obesity, Recurrent UTI with incontinence, RA and OA)   ?Priority: High  ?Onset Date: 11/08/2020  ?  ? ?Long-Range Goal: RNCM: Effective management of plan of care for Chronic Disease  Mangement and Care Coordination Needs(HTN, HLD, DM, Obesity, Recurrent UTI with incontinence, RA and OA)   ?Start Date: 11/08/2020  ?Expected End Date: 11/08/2021  ?Priority: High  ?Note:   ?Current Barriers:  ?Knowledge Deficits related to plan of care for management of HTN, HLD, DMII, and Obesity, Recurrent UTI with urinary incontinence, RA, and OA  ?Chronic Disease Management support and education needs related to HTN, HLD, DMII, and Obesity, Recurrent UTI with urinary incontinence, RA, and OA  ? ?RNCM Clinical Goal(s):  ?Patient will verbalize understanding of plan for management of HTN, HLD, DMII, Osteoarthritis, and RA, Obesity, Recurrent UTI with urinary incontinence  ?verbalize basic understanding of HTN, HLD, DMII, Osteoarthritis, and RA, Obesity, Recurrent UTI with urinary incontinence disease process and self health management plan  ?take all medications exactly as prescribed and will call provider for medication related questions ?demonstrate understanding of rationale for each prescribed medication  ?attend all scheduled medical appointments: 06-04-2021 ?demonstrate improved and ongoing adherence to prescribed treatment plan for HTN, HLD, DMII, Osteoarthritis, and RA, Obesity, recurrent UTI's with urinary incontinence  as evidenced by daily monitoring and recording of CBG  adherence to ADA/ carb modified diet exercise 4/5 days/week adherence to prescribed medication regimen contacting provider for new or worsened symptoms or questions  ?demonstrate improved and ongoing health management independence  ?continue to work with Consulting civil engineer and/or Social Worker to address care management and care coordination needs related to HTN, HLD, DMII, Osteoarthritis, and RA, Obesity, frequent UTI's with urinary incontinence   ?demonstrate a decrease in HTN, HLD, DMII, and Osteoarthritis exacerbations RA, obesity, and recurrent UTI's with urinary incontinence  ?demonstrate ongoing self health care management ability  to effectively manage chronic conditions through collaboration with RN Care manager, provider, and care team.  ? ?Interventions: ?1:1 collaboration with primary care provider regarding development and update of comprehensive plan of care as evidenced by provider attestation and co-signature ?Inter-disciplinary care team collaboration (see longitudinal plan of care) ?Evaluation of current treatment plan related to  self management and patient's adherence to plan as established by provider ? ? ?SDOH Barriers (Status: Goal on track: YES.) Long Term Goal (>30 days) ?Patient interviewed and SDOH assessment performed ?       ?SDOH Interventions   ? ?Flowsheet Row Most Recent Value  ?SDOH Interventions   ?Food Insecurity Interventions Intervention Not Indicated  ?Financial Strain Interventions Intervention Not Indicated  ?Housing Interventions Intervention Not Indicated  ?Intimate Partner Violence Interventions Intervention Not Indicated  ?Physical Activity Interventions Intervention Not Indicated  ?Stress Interventions Intervention Not Indicated  ?Social Connections Interventions Other (Comment)  [moved her from [place], is happy to be in [place]. Great support system]  ?Transportation Interventions Intervention Not Indicated  ? ?  ?Patient interviewed and appropriate assessments performed ?Provided patient with information about resources available in [facility] and care guides to help with any changes in SDOH ?Discussed plans with patient for ongoing care management follow up and provided patient with direct contact information for care management team ?Advised patient to to call the office for changes in conditions, changes in SDOH, questions or concerns ? ? ? ?Diabetes:  (Status: New goal. Goal on track:  YES.) ?Lab Results  ?Component Value Date  ? HGBA1C 5.4 01/12/2021  ?Assessed patient's understanding of A1c goal: <7% ?Provided education to patient about basic DM disease process; ?Reviewed medications with patient and  discussed importance of medication adherence. 11-08-2020: The patient is taking Ozempic as prescribed. States she has had some nausea but not bad. Education and support given. 01-16-2021: Endorses compli

## 2021-03-27 NOTE — Telephone Encounter (Signed)
Attempted to contact patient to schedule appt for this afternoon and had to leave message  ?

## 2021-03-27 NOTE — Telephone Encounter (Signed)
Patient called in asking to speak to [place] regarding issues with Ozempic paperwork say that she spoke with patient assistant and they are asking for the paperwork to be sent back to them in total again because they did not receive it correctly. Also say that it will take a at least 6 to 10 week due to back order and processing paperwork take another 2 weeks so there will be at least a few months before she would be able to get the medication. Patient stated that she spoke with her insurance and they agreed that she can be prescribed Metformin in the mean time while waiting so she will not be without medication if Dr Neomia Dear so choose she can send an Rx to the pharmacy for the metformin unless she will have samples of the Ozempic for the next few months till things get straighten out. Please call with questions and concerns to  Ph# [phone] ?

## 2021-03-27 NOTE — Telephone Encounter (Signed)
-----   Message from Charlynne Cousins, MD sent at 03/27/2021 11:24 AM EDT ----- ?Regarding: FW: Patient requestion a phone visit or video visit- concerned about DM management ?Can see her today afternoon at 2 pm for a video visit pl see if she wants to do this thnx ?----- Message ----- ?From: Vanita Ingles, RN ?Sent: 03/27/2021  10:55 AM EDT ?To: Michaelle Birks, RMA, Madelin Rear, [facility], # ?Subject: Patient requestion a phone visit or video vi# ? ?Hello, ?The patient is requesting a phone visit or video visit with Dr. Neomia Dear. ?I spoke to the patient today and she is concerned about her DM health and her blood sugars going up. She was told her paperwork  for Ozempic is not correct and she talked to the assistance program and it will be at least 3 months after correct paperwork is sent and the supply demand for Ozempic before she can get Ozempic. I am including Edison Nasuti on this for help since she is Medicare.  ?She talked to the [place] and they recommended asking for Metformin and that is why she would like to talk to Dr. Neomia Dear to see what recommendations are. ?Dr. Neomia Dear she also wanted me to tell you she is having an ultrasound of her leg tomorrow to evaluation for DVT or vascular issues.  ?Thanks,  ?Pam ? ? ?

## 2021-03-27 NOTE — Patient Instructions (Signed)
Visit Information ? ?Thank you for taking time to visit with me today. Please don't hesitate to contact me if I can be of assistance to you before our next scheduled telephone appointment. ? ?Following are the goals we discussed today:  ?RNCM Clinical Goal(s):  ?Patient will verbalize understanding of plan for management of HTN, HLD, DMII, Osteoarthritis, and RA, Obesity, Recurrent UTI with urinary incontinence  ?verbalize basic understanding of HTN, HLD, DMII, Osteoarthritis, and RA, Obesity, Recurrent UTI with urinary incontinence disease process and self health management plan  ?take all medications exactly as prescribed and will call provider for medication related questions ?demonstrate understanding of rationale for each prescribed medication  ?attend all scheduled medical appointments: 06-04-2021 ?demonstrate improved and ongoing adherence to prescribed treatment plan for HTN, HLD, DMII, Osteoarthritis, and RA, Obesity, recurrent UTI's with urinary incontinence  as evidenced by daily monitoring and recording of CBG  adherence to ADA/ carb modified diet exercise 4/5 days/week adherence to prescribed medication regimen contacting provider for new or worsened symptoms or questions  ?demonstrate improved and ongoing health management independence  ?continue to work with Consulting civil engineer and/or Social Worker to address care management and care coordination needs related to HTN, HLD, DMII, Osteoarthritis, and RA, Obesity, frequent UTI's with urinary incontinence   ?demonstrate a decrease in HTN, HLD, DMII, and Osteoarthritis exacerbations RA, obesity, and recurrent UTI's with urinary incontinence  ?demonstrate ongoing self health care management ability to effectively manage chronic conditions through collaboration with RN Care manager, provider, and care team.  ?  ?Interventions: ?1:1 collaboration with primary care provider regarding development and update of comprehensive plan of care as evidenced by provider  attestation and co-signature ?Inter-disciplinary care team collaboration (see longitudinal plan of care) ?Evaluation of current treatment plan related to  self management and patient's adherence to plan as established by provider ?  ?  ?SDOH Barriers (Status: Goal on track: YES.) Long Term Goal (>30 days) ?Patient interviewed and SDOH assessment performed ?       ?SDOH Interventions   ?  ?Flowsheet Row Most Recent Value  ?SDOH Interventions    ?Food Insecurity Interventions Intervention Not Indicated  ?Financial Strain Interventions Intervention Not Indicated  ?Housing Interventions Intervention Not Indicated  ?Intimate Partner Violence Interventions Intervention Not Indicated  ?Physical Activity Interventions Intervention Not Indicated  ?Stress Interventions Intervention Not Indicated  ?Social Connections Interventions Other (Comment)  [moved her from [place], is happy to be in [place]. Great support system]  ?Transportation Interventions Intervention Not Indicated  ?  ?   ?Patient interviewed and appropriate assessments performed ?Provided patient with information about resources available in [facility] and care guides to help with any changes in SDOH ?Discussed plans with patient for ongoing care management follow up and provided patient with direct contact information for care management team ?Advised patient to to call the office for changes in conditions, changes in SDOH, questions or concerns ?  ?  ?  ?Diabetes:  (Status: New goal. Goal on track: YES.) ?     ?Lab Results  ?Component Value Date  ?  HGBA1C 5.4 01/12/2021  ?Assessed patient's understanding of A1c goal: <7% ?Provided education to patient about basic DM disease process; ?Reviewed medications with patient and discussed importance of medication adherence. 11-08-2020: The patient is taking Ozempic as prescribed. States she has had some nausea but not bad. Education and support given. 01-16-2021: Endorses compliance with medications. 03-27-2021: The patient  is having issues with getting her Ozempic. She called the assistance program and  they told her the paperwork was not correct and would need to be resubmitted. The patient states that they also have supply demand problems and it would be 3 months before she would be able to get the Ozempic once paperwork resubmitted. Will illicit the help of the pharm D. The patient was asking about Metformin as she called the nurse at [facility] and was told that she needed to talk to her pcp about additional medications to help. Collaboration with the pcp and the pharm D for recommendations for the patient.      ?Reviewed prescribed diet with patient heart healthy/ADA diet. 01-16-2021: The patient is interested in a dietician to help with meal planning and choices. Gave the patient the number to   [place] [facility] [place] [phone]. She has relocated and needs a facility closer to where she lives. The patient also agrees to receive the healthy meal planning information by mail. The patient will talk with pcp at visit tomorrow about referral to dietician. Education and support given about meals and eating habits; ?Counseled on importance of regular laboratory monitoring as prescribed;        ?Discussed plans with patient for ongoing care management follow up and provided patient with direct contact information for care management team;      ?Provided patient with written educational materials related to hypo and hyperglycemia and importance of correct treatment. 03-27-2021: Denies any lows at this time;       ?Reviewed scheduled/upcoming provider appointments including: 06-04-2021         ?Advised patient, providing education and rationale, to check cbg as directed  and record. 03-27-2021: The patient is doing well with her blood sugars at this time but is having issues with getting the Ozempic. She has been in contact with the assistance program and her insurance. Education and support provided.         ?call provider for findings outside established parameters;       ?Review of patient status, including review of consultants reports, relevant laboratory and other test results, and medications completed;       ?Screening for signs and symptoms of depression related to chronic disease state;        ?Assessed social determinant of health barriers;        ?  ?Recurrent UTI's with urinary incontinence   (Status: Goal on Track (progressing): YES.) ?Evaluation of current treatment plan related to  Recurrent UTI's with urinary incontience  ,  self-management and patient's adherence to plan as established by provider. 03-27-2021: The patient is seeing the specialist and working with specialist on a consistent basis. The patient denies any new concerns and is following recommendations by the provider ?Discussed plans with patient for ongoing care management follow up and provided patient with direct contact information for care management team ?Advised patient to call the office for changes in urinary health, any sx or sx of recurrent infection, keep appointments; ?Provided education to patient re: education on taking showers other than baths, drinking cranberry juice if recommended by the provider, good hygiene and monitoring for changes in urinary health; ?Reviewed medications with patient and discussed compliance. The patient is taking an ABX for 3 months and following the recommendations of the urolgogist ; ?Reviewed scheduled/upcoming provider appointments including 06-04-2021; ?Discussed plans with patient for ongoing care management follow up and provided patient with direct contact information for care management team; ?  ?Hyperlipidemia:  (Status: Goal on Track (progressing): YES.) ?     ?  Lab Results  ?Component Value Date  ?  CHOL 151 03/05/2021  ?  HDL 61 03/05/2021  ?  [place] 73 03/05/2021  ?  TRIG 89 03/05/2021  ?  CHOLHDL 2.5 03/05/2021  ?  ?  ?Medication review performed; medication list updated in  electronic medical record.  ?Provider established cholesterol goals reviewed. 03-27-2021: Review of lab work and praised for levels trending down to normal. The patient is actively doing what she can to do bet

## 2021-03-28 ENCOUNTER — Other Ambulatory Visit: Payer: Self-pay

## 2021-03-28 ENCOUNTER — Ambulatory Visit (HOSPITAL_COMMUNITY)
Admission: RE | Admit: 2021-03-28 | Discharge: 2021-03-28 | Disposition: A | Discharge status: Home / Self Care | Payer: Medicare Other | Source: Ambulatory Visit | Attending: Cardiology | Admitting: Cardiology

## 2021-03-28 DIAGNOSIS — I8393 Asymptomatic varicose veins of bilateral lower extremities: Secondary | ICD-10-CM | POA: Diagnosis not present

## 2021-03-28 DIAGNOSIS — M47816 Spondylosis without myelopathy or radiculopathy, lumbar region: Secondary | ICD-10-CM | POA: Diagnosis not present

## 2021-03-29 ENCOUNTER — Telehealth (INDEPENDENT_AMBULATORY_CARE_PROVIDER_SITE_OTHER): Payer: Medicare Other | Admitting: Internal Medicine

## 2021-03-29 DIAGNOSIS — R7303 Prediabetes: Secondary | ICD-10-CM | POA: Diagnosis not present

## 2021-03-29 MED ORDER — METFORMIN HCL 500 MG PO TABS: 500.0000 mg | ORAL_TABLET | Freq: Two times a day (BID) | ORAL | 3 refills | Status: DC

## 2021-03-29 NOTE — Progress Notes (Signed)
? ?There were no vitals taken for this visit.  ? ?Subjective:  ? ? [name], female    DOB: 1970-04-03, 51 y.o.   MRN: [id number] ? ?Chief Complaint  ?Patient presents with  ? Diabetes  ?  Wants to switch from Ozempic to metformin ?  ? ? ?HPI: ?Elizabeth Patrick is a 51 y.o. female ? ? ?This visit was completed via telephone due to the restrictions of the COVID-19 pandemic. All issues as above were discussed and addressed but no physical exam was performed. If it was felt that the patient should be evaluated in the office, they were directed there. The patient verbally consented to this visit. Patient was unable to complete an audio/visual visit due to Technical difficulties. Due to the catastrophic nature of the COVID-19 pandemic, this visit was done through audio contact only. ?Location of the patient: home ?Location of the provider: work ?Those involved with this call:  ?Provider: Charlynne Cousins, MD ?CMA: Frazier Butt, CMA ?Front Desk/Registration: [employer]  ?Time spent on call: 15 minutes on the phone discussing health concerns. 15 minutes total spent in review of patient's record and preparation of their chart. ? ? ? ? ? ?Diabetes ?She presents for her follow-up (prediabetes) diabetic visit. Associated symptoms include weight loss. Pertinent negatives for diabetes include no blurred vision, no chest pain, no fatigue, no foot paresthesias, no foot ulcerations, no polydipsia, no polyphagia, no polyuria, no visual change and no weakness. (Wt loss sec to ozempic not able to get sec to insurance issues now)  ? ?Chief Complaint  ?Patient presents with  ? Diabetes  ?  Wants to switch from Ozempic to metformin ?  ? ? ?Relevant past medical, surgical, family and social history reviewed and updated as indicated. Interim medical history since our last visit reviewed. ?Allergies and medications reviewed and updated. ? ?Review of Systems  ?Constitutional:  Positive for weight loss. Negative for fatigue.   ?Eyes:  Negative for blurred vision.  ?Cardiovascular:  Negative for chest pain.  ?Endocrine: Negative for polydipsia, polyphagia and polyuria.  ?Neurological:  Negative for weakness.  ? ?Per HPI unless specifically indicated above ? ?   ?Objective:  ?  ?There were no vitals taken for this visit.  ?Wt Readings from Last 3 Encounters:  ?03/05/21 (!) 314 lb (142.4 kg)  ?03/05/21 (!) 340 lb 12.8 oz (154.6 kg)  ?02/01/21 (!) 343 lb 11.2 oz (155.9 kg)  ?  ?Physical Exam ? ?Unable to peform sec to virtual visit.  ? ?Results for orders placed or performed in visit on 03/05/21  ?Lipid panel  ?Result Value Ref Range  ? Cholesterol, Total 151 100 - 199 mg/dL  ? Triglycerides 89 0 - 149 mg/dL  ? HDL 61 >39 mg/dL  ? VLDL Cholesterol Cal 17 5 - 40 mg/dL  ? LDL Chol Calc (NIH) 73 0 - 99 mg/dL  ? Chol/HDL Ratio 2.5 0.0 - 4.4 ratio  ? ?   ? ? ?Current Outpatient Medications:  ?  amLODipine (NORVASC) 10 MG tablet, 1 tablet, Disp: , Rfl:  ?  atorvastatin (LIPITOR) 10 MG tablet, Take 1 tablet (10 mg total) by mouth daily., Disp: 90 tablet, Rfl: 3 ?  Cholecalciferol (D3 2000 PO), Take 2 tablets by mouth., Disp: , Rfl:  ?  Cranberry 1000 MG CAPS, , Disp: , Rfl:  ?  esomeprazole (NEXIUM) 40 MG capsule, Take 40 mg by mouth every other day., Disp: , Rfl:  ?  estradiol (ESTRACE) 0.1 MG/GM  vaginal cream, Estrogen Cream Instruction Discard applicator Apply pea sized amount to tip of finger to urethra before bed. Wash hands well after application. Use Monday, Wednesday and Friday, Disp: 42.5 g, Rfl: 11 ?  fenofibrate (TRICOR) 145 MG tablet, TAKE 1 TABLET(145 MG) BY MOUTH DAILY, Disp: 90 tablet, Rfl: 0 ?  furosemide (LASIX) 20 MG tablet, Take 20 mg by mouth as needed., Disp: , Rfl:  ?  gabapentin (NEURONTIN) 800 MG tablet, Take 800 mg by mouth 3 (three) times daily., Disp: , Rfl:  ?  Lidocaine (BLUE-EMU PAIN RELIEF DRY EX), Apply topically., Disp: , Rfl:  ?  losartan (COZAAR) 100 MG tablet, TAKE 1 TABLET(100 MG) BY MOUTH DAILY, Disp: 90  tablet, Rfl: 0 ?  metoprolol succinate (TOPROL-XL) 25 MG 24 hr tablet, TAKE 1 TABLET(25 MG) BY MOUTH DAILY, Disp: 30 tablet, Rfl: 3 ?  OZEMPIC, 0.25 OR 0.5 MG/DOSE, 2 MG/1.5ML SOPN, Inject into the skin., Disp: , Rfl:  ?  SUPER B COMPLEX/C PO, Take by mouth., Disp: , Rfl:  ?  tiZANidine (ZANAFLEX) 2 MG tablet, Take by mouth every 6 (six) hours as needed for muscle spasms., Disp: , Rfl:  ?  Tocilizumab (ACTEMRA IV), Inject into the vein., Disp: , Rfl:  ?  triamcinolone ointment (KENALOG) 0.1 %, Apply 1 application topically 3 (three) times daily., Disp: 30 g, Rfl: 2  ? ? ?Assessment & Plan:  ?Prediabetes : a1c at 5.6 pt requests metformin , a1c wnl now. Wants to switch from ozempic to such.  ?sent to the pharmacy ?Lifestyle modifications advised to pt.  Portion control and avoiding high carb low fat diet advised.  Diet plan given to pt   exercise plan given and encouraged.  To increase exercise to 150 mins a week ie 21/2 hours a week. Pt verbalises understanding of the above.  ? ?Problem List Items Addressed This Visit   ?None ?  ? ?No orders of the defined types were placed in this encounter. ?  ? ?No orders of the defined types were placed in this encounter. ?  ? ? ? ?

## 2021-04-02 ENCOUNTER — Telehealth: Payer: Self-pay | Admitting: Cardiology

## 2021-04-02 ENCOUNTER — Telehealth: Payer: Self-pay | Admitting: Internal Medicine

## 2021-04-02 DIAGNOSIS — M069 Rheumatoid arthritis, unspecified: Secondary | ICD-10-CM

## 2021-04-02 NOTE — Telephone Encounter (Signed)
Patient calling for test results. °

## 2021-04-02 NOTE — Telephone Encounter (Signed)
The patient has been notified of the result and verbalized understanding.  All questions (if any) were answered. ?Kavin Leech, RN 04/02/2021 10:12 AM  ? ? ?No evidence of DVT in the lower extremity. ?

## 2021-04-02 NOTE — Telephone Encounter (Signed)
Copied from [place] [phone]. Topic: General - Other ?>> Apr 02, 2021 10:28 AM Tessa Lerner A wrote: ?Reason for CRM: The patient had an ultrasound on 03/28/21 and has been told that they have a ruptured cyst on the back of their left knee ? ?The patient would like to know if Dr. Neomia Dear would like to see them for an additional visit or if they will be able to be referred to a specialist to treat their bakers cyst further ? ?The patient has declined to schedule an additional visit before speaking further with a member of clinical staff  ? ?Please contact further ?

## 2021-04-02 NOTE — Telephone Encounter (Signed)
Thank you :)

## 2021-04-02 NOTE — Telephone Encounter (Signed)
Patient is aware 

## 2021-04-02 NOTE — Telephone Encounter (Signed)
Called patient to discuss the message she left for Dr. Neomia Dear, LVM to inform patient to please call office to set up an appt as Dr. Neomia Dear would want to see her for this issue concerning her knee ? ?

## 2021-04-03 ENCOUNTER — Other Ambulatory Visit: Payer: Medicare Other

## 2021-04-04 ENCOUNTER — Encounter: Payer: Self-pay | Admitting: Orthopaedic Surgery

## 2021-04-04 ENCOUNTER — Other Ambulatory Visit: Payer: Self-pay

## 2021-04-04 ENCOUNTER — Ambulatory Visit (INDEPENDENT_AMBULATORY_CARE_PROVIDER_SITE_OTHER): Payer: Medicare Other | Admitting: Orthopaedic Surgery

## 2021-04-04 DIAGNOSIS — M1712 Unilateral primary osteoarthritis, left knee: Secondary | ICD-10-CM

## 2021-04-04 DIAGNOSIS — Z6841 Body Mass Index (BMI) 40.0 and over, adult: Secondary | ICD-10-CM

## 2021-04-04 DIAGNOSIS — M1711 Unilateral primary osteoarthritis, right knee: Secondary | ICD-10-CM

## 2021-04-04 DIAGNOSIS — M170 Bilateral primary osteoarthritis of knee: Secondary | ICD-10-CM | POA: Insufficient documentation

## 2021-04-05 ENCOUNTER — Telehealth: Payer: Self-pay

## 2021-04-05 DIAGNOSIS — Z6841 Body Mass Index (BMI) 40.0 and over, adult: Secondary | ICD-10-CM | POA: Insufficient documentation

## 2021-04-05 NOTE — Progress Notes (Signed)
? ?Office Visit Note ?  ?Patient: Elizabeth Patrick           ?Date of Birth: [birth year]           ?MRN: [id number] ?Visit Date: 04/04/2021 ?             ?Requested by: Charlynne Cousins, MD ?[address] ?[place],  [place] [address] ?PCP: Charlynne Cousins, MD ? ? ?Assessment & Plan: ?Visit Diagnoses:  ?1. Primary osteoarthritis of right knee   ?2. Primary osteoarthritis of left knee   ?3. Body mass index 50.0-59.9, adult ([place])   ?4. Morbid obesity ([place])   ? ? ?Plan: Impression is advanced tricompartmental DJD with varus deformity and bone on bone joint space narrowing.  She will continue to make all efforts at weight loss until target weight of 230 lbs.  She will continue with conservative management for now.  Questions answered. ? ?The patient meets the AMA guidelines for Morbid (severe) obesity with a BMI > 40.0 and I have recommended weight loss. ? ?Follow-Up Instructions: No follow-ups on file.  ? ?Orders:  ?No orders of the defined types were placed in this encounter. ? ?No orders of the defined types were placed in this encounter. ? ? ? ? Procedures: ?No procedures performed ? ? ?Clinical Data: ?No additional findings. ? ? ?Subjective: ?Chief Complaint  ?Patient presents with  ? Left Knee - Pain  ? ? ?HPI ? ?Jaima is a very pleasant 51 year old female here for severe bilateral knee pain worse on the left.  She's had a recurrent Baker's cyst in the left leg.  Recent ultrasound was negative for DVT.  She's had many cortisone injection with a few months of relief.  Takes tylenol and gabapentin for the pain.  She has tried PT but her knees are too painful.   ? ?Review of Systems  ?Constitutional: Negative.   ?HENT: Negative.    ?Eyes: Negative.   ?Respiratory: Negative.    ?Cardiovascular: Negative.   ?Endocrine: Negative.   ?Musculoskeletal: Negative.   ?Neurological: Negative.   ?Hematological: Negative.   ?Psychiatric/Behavioral: Negative.    ?All other systems reviewed and are negative. ? ? ?Objective: ?Vital Signs: There were no  vitals taken for this visit. ? ?Physical Exam ?Vitals and nursing note reviewed.  ?Constitutional:   ?   Appearance: She is well-developed.  ?HENT:  ?   Head: Normocephalic and atraumatic.  ?Pulmonary:  ?   Effort: Pulmonary effort is normal.  ?Abdominal:  ?   Palpations: Abdomen is soft.  ?Musculoskeletal:  ?   Cervical back: Neck supple.  ?Skin: ?   General: Skin is warm.  ?   Capillary Refill: Capillary refill takes less than 2 seconds.  ?Neurological:  ?   Mental Status: She is alert and oriented to person, place, and time.  ?Psychiatric:     ?   Behavior: Behavior normal.     ?   Thought Content: Thought content normal.     ?   Judgment: Judgment normal.  ? ? ?Ortho Exam ? ?Examination of bilateral knees ?- 2+ crepitus with ROM with significant pain ?- pain with flexion of knee past 40 degrees ?- trace joint effusion ?- ligamentous exam normal ? ?Specialty Comments:  ?No specialty comments available. ? ?Imaging: ?No results found. ? ? ?PMFS History: ?Patient Active Problem List  ? Diagnosis Date Noted  ? Body mass index 50.0-59.9, adult ([place]) 04/05/2021  ? Morbid obesity ([place]) 04/05/2021  ? Primary osteoarthritis of  right knee 04/04/2021  ? Primary osteoarthritis of left knee 04/04/2021  ? Gastroesophageal reflux disease 11/28/2020  ? Need for influenza vaccination 11/28/2020  ? Recurrent UTI 10/17/2020  ? PAD (peripheral artery disease) ([place]) 10/11/2020  ? Neuropathy 10/11/2020  ? Hyperlipidemia 10/11/2020  ? Lymphedema 10/11/2020  ? Screen for colon cancer 10/10/2020  ? Screening breast examination 10/10/2020  ? NASH (nonalcoholic steatohepatitis) 10/10/2020  ? Rheumatoid arthritis ([place]) 09/26/2020  ? Primary hypertension 09/26/2020  ? Osteoarthritis 09/26/2020  ? ?Past Medical History:  ?Diagnosis Date  ? RA (rheumatoid arthritis) ([place])   ?  ?Family History  ?Problem Relation Age of Onset  ? Obesity Mother   ? Coronary artery disease Mother   ? Bipolar disorder Mother   ? Multiple sclerosis Mother   ?  Hypertension Father   ? Heart attack Father   ? Vascular Disease Father   ?  ?Past Surgical History:  ?Procedure Laterality Date  ? ABDOMINAL HYSTERECTOMY    ? ?Social History  ? ?Occupational History  ? Not on file  ?Tobacco Use  ? Smoking status: Never  ? Smokeless tobacco: Never  ?Substance and Sexual Activity  ? Alcohol use: Not Currently  ? Drug use: Never  ? Sexual activity: Not Currently  ?  Partners: Male  ?  Birth control/protection: Surgical  ? ? ? ? ? ? ?

## 2021-04-05 NOTE — Chronic Care Management (AMB) (Signed)
?  Care Management  ? ?Note ? ?04/05/2021 ?Name: [name] MRN: [id number] DOB: 05-11-1970 ? ?Elizabeth Patrick is a 51 y.o. year old female who is a primary care patient of Vigg, [name], MD and is actively engaged with the care management team. I reached out to [employer] by phone today to assist with re-scheduling a follow up visit with the Licensed Clinical Social Worker ? ?Follow up plan: ?Unsuccessful telephone outreach attempt made. A HIPAA compliant phone message was left for the patient providing contact information and requesting a return call.  ?The care management team will reach out to the patient again over the next 5 days.  ?If patient returns call to provider office, please advise to call [facility]  at [phone] ? ?Noreene Larsson, RMA ?Care Guide, Embedded Care Coordination ?[facility]  ?[place], [place] [address] ?Direct Dial: [phone] ?[email or web address] ?Website: [email or web address]  ? ?

## 2021-04-09 ENCOUNTER — Telehealth: Payer: Medicare Other

## 2021-04-09 ENCOUNTER — Ambulatory Visit: Payer: Medicare Other | Admitting: Gastroenterology

## 2021-04-11 ENCOUNTER — Ambulatory Visit: Payer: Medicare Other | Admitting: Gastroenterology

## 2021-04-11 ENCOUNTER — Encounter: Payer: Self-pay | Admitting: Gastroenterology

## 2021-04-11 DIAGNOSIS — K7581 Nonalcoholic steatohepatitis (NASH): Secondary | ICD-10-CM

## 2021-04-11 NOTE — Progress Notes (Signed)
?  ?Elizabeth Bellows MD, MRCP(U.K) ?[address]  ?[address]  ?[place], [place] [address]  ?Main: [phone]  ?Fax: [phone] ? ? ?Primary Care Physician: Charlynne Cousins, MD ? ?Primary Gastroenterologist:  Dr. Jonathon Patrick  ? ?Chief Complaint  ?Patient presents with  ? NASH  ? ? ?HPI: Elizabeth Patrick is a 51 y.o. female ? ? ?Summary of history : ? ?Initially referred and seen in November 2022 for NASH.  In 2021 when the patient lived in [place] was found to have elevated liver function tests while on methotrexate for rheumatoid arthritis and underwent a liver biopsy which showed features of [name] but no evidence of cirrhosis.  No fibrosis noted either.  Subsequently lost weight intentionally.  No alcohol use.  History of hyperlipidemia, obesity, hypertension. ? ?Interval history   12/12/2020-04/11/2021 ? ?12/12/2020 [facility] test showed a fibrosis score of 0.4 which is an F1 F2 fibrosis and a Nash score of 0.75 indicating NASH and steatosis of score of 0.43 indicating minimal steatosis: ?12/21/2020: Right upper quadrant ultrasound shows hepatic steatosis ? ?12/12/2020 indicated not immune to hepatitis A and B and will need vaccination.  Ceruloplasmin levels were low recommended rechecking  ?She is on metformin for weight loss. She is going to start Ozempic soon and has lost 50 lbs . No new concerns.  ? ?Current Outpatient Medications  ?Medication Sig Dispense Refill  ? amLODipine (NORVASC) 10 MG tablet 1 tablet    ? atorvastatin (LIPITOR) 10 MG tablet Take 1 tablet (10 mg total) by mouth daily. 90 tablet 3  ? Cholecalciferol (D3 2000 PO) Take 2 tablets by mouth.    ? Cranberry 1000 MG CAPS     ? esomeprazole (NEXIUM) 40 MG capsule Take 40 mg by mouth every other day.    ? estradiol (ESTRACE) 0.1 MG/GM vaginal cream Estrogen Cream Instruction Discard applicator Apply pea sized amount to tip of finger to urethra before bed. Wash hands well after application. Use Monday, Wednesday and Friday 42.5 g 11  ?  fenofibrate (TRICOR) 145 MG tablet TAKE 1 TABLET(145 MG) BY MOUTH DAILY 90 tablet 0  ? furosemide (LASIX) 20 MG tablet Take 20 mg by mouth as needed.    ? gabapentin (NEURONTIN) 800 MG tablet Take 800 mg by mouth 3 (three) times daily.    ? Lidocaine (BLUE-EMU PAIN RELIEF DRY EX) Apply topically.    ? losartan (COZAAR) 100 MG tablet TAKE 1 TABLET(100 MG) BY MOUTH DAILY 90 tablet 0  ? metFORMIN (GLUCOPHAGE) 500 MG tablet Take 1 tablet (500 mg total) by mouth 2 (two) times daily with a meal. 90 tablet 3  ? metoprolol succinate (TOPROL-XL) 25 MG 24 hr tablet TAKE 1 TABLET(25 MG) BY MOUTH DAILY 30 tablet 3  ? SUPER B COMPLEX/C PO Take by mouth.    ? tiZANidine (ZANAFLEX) 2 MG tablet Take by mouth every 6 (six) hours as needed for muscle spasms.    ? Tocilizumab (ACTEMRA IV) Inject into the vein.    ? triamcinolone ointment (KENALOG) 0.1 % Apply 1 application topically 3 (three) times daily. 30 g 2  ? ?No current facility-administered medications for this visit.  ? ? ?Allergies as of 04/11/2021 - Review Complete 04/11/2021  ?Allergen Reaction Noted  ? Alcohol Anaphylaxis 09/26/2020  ? Dilantin [phenytoin] Anaphylaxis 09/26/2020  ? ? ?ROS: ? ?General: Negative for anorexia, weight loss, fever, chills, fatigue, weakness. ?ENT: Negative for hoarseness, difficulty swallowing , nasal congestion. ?CV: Negative for chest pain, angina, palpitations, dyspnea on  exertion, peripheral edema.  ?Respiratory: Negative for dyspnea at rest, dyspnea on exertion, cough, sputum, wheezing.  ?GI: See history of present illness. ?GU:  Negative for dysuria, hematuria, urinary incontinence, urinary frequency, nocturnal urination.  ?Endo: Negative for unusual weight change.  ?  ?Physical Examination: ? ? BP 137/81   Pulse 74   Temp 98.3 ?F (36.8 ?C) (Oral)   Wt (!) 339 lb (153.8 kg)   BMI 58.19 kg/m?  ? ?General: Well-nourished, well-developed in no acute distress.  ?Eyes: No icterus. Conjunctivae pink. ?Neuro: Alert and oriented x 3.   Grossly intact. ?Skin: Warm and dry, no jaundice.   ?Psych: Alert and cooperative, normal mood and affect. ? ? ?Imaging Studies: ?VAS [place] LOWER EXTREMITY VENOUS REFLUX ? ?Result Date: 03/29/2021 ? Lower Venous Reflux Study Patient Name:  Elizabeth Patrick  Date of Exam:   03/28/2021 Medical Rec #: [id number]         Accession #:    [id number] Date of Birth: [birth year]         Patient Gender: F Patient Age:   72 years Exam Location:  [facility] Procedure:      VAS [place] LOWER EXTREMITY VENOUS REFLUX Referring Phys: Kate Sable --------------------------------------------------------------------------------  Indications: Edema. Patient with longstanding leg edema, with the left leg usually more swollen the the right. She was diagnosed with lymphedema years ago and seen at a lymphedema clinic in [place] but without much improvement in symptoms. Left leg swelling noticed over the past several weeks with varicose veins present.  Risk Factors: Obesity. Limitations: Body habitus and poor ultrasound/tissue interface. Performing Technologist: Mariane Masters RVT  Examination Guidelines: A complete evaluation includes B-mode imaging, spectral Doppler, color Doppler, and power Doppler as needed of all accessible portions of each vessel. Bilateral testing is considered an integral part of a complete examination. Limited examinations for reoccurring indications may be performed as noted. The reflux portion of the exam is performed with the patient in reverse Trendelenburg. Significant venous reflux is defined as >500 ms in the superficial venous system, and >1 second in the deep venous system.  Venous Reflux Times +--------------+---------+------+-----------+------------+--------+ RIGHT         Reflux NoRefluxReflux TimeDiameter cmsComments                         Yes                                  +--------------+---------+------+-----------+------------+--------+ CFV           no                                              +--------------+---------+------+-----------+------------+--------+ FV prox                 yes                                  +--------------+---------+------+-----------+------------+--------+ FV mid        no                                             +--------------+---------+------+-----------+------------+--------+  FV dist       no                                             +--------------+---------+------+-----------+------------+--------+ Popliteal     no                                             +--------------+---------+------+-----------+------------+--------+ GSV at [facility]    no                            0.51             +--------------+---------+------+-----------+------------+--------+ GSV prox thighno                            0.53             +--------------+---------+------+-----------+------------+--------+ GSV at knee   no                            0.39             +--------------+---------+------+-----------+------------+--------+ GSV mid calf  no                            0.38             +--------------+---------+------+-----------+------------+--------+ SSV prox calf no                            0.49             +--------------+---------+------+-----------+------------+--------+  +--------------+---------+------+-----------+------------+--------+ LEFT          Reflux NoRefluxReflux TimeDiameter cmsComments                         Yes                                  +--------------+---------+------+-----------+------------+--------+ CFV           no                                             +--------------+---------+------+-----------+------------+--------+ FV prox       no                                             +--------------+---------+------+-----------+------------+--------+ FV mid        no                                              +--------------+---------+------+-----------+------------+--------+ FV dist       no                                             +--------------+---------+------+-----------+------------+--------+  Popliteal     no                                             +--------------+---------+------+-----------+------------+--------+ GSV at [facility]    no

## 2021-04-12 DIAGNOSIS — M0609 Rheumatoid arthritis without rheumatoid factor, multiple sites: Secondary | ICD-10-CM | POA: Diagnosis not present

## 2021-04-12 LAB — CERULOPLASMIN: Ceruloplasmin: 12.1 mg/dL — ABNORMAL LOW (ref 19.0–39.0)

## 2021-04-12 NOTE — Progress Notes (Signed)
Ceruloplasmin still low can we get 24 hour urinary copper please

## 2021-04-13 DIAGNOSIS — I10 Essential (primary) hypertension: Secondary | ICD-10-CM

## 2021-04-13 DIAGNOSIS — M069 Rheumatoid arthritis, unspecified: Secondary | ICD-10-CM | POA: Diagnosis not present

## 2021-04-13 DIAGNOSIS — E119 Type 2 diabetes mellitus without complications: Secondary | ICD-10-CM

## 2021-04-13 DIAGNOSIS — E785 Hyperlipidemia, unspecified: Secondary | ICD-10-CM

## 2021-04-13 DIAGNOSIS — M1990 Unspecified osteoarthritis, unspecified site: Secondary | ICD-10-CM

## 2021-04-13 DIAGNOSIS — Z794 Long term (current) use of insulin: Secondary | ICD-10-CM

## 2021-04-16 ENCOUNTER — Telehealth: Payer: Self-pay

## 2021-04-16 ENCOUNTER — Ambulatory Visit (INDEPENDENT_AMBULATORY_CARE_PROVIDER_SITE_OTHER): Payer: Medicare Other | Admitting: Nurse Practitioner

## 2021-04-16 ENCOUNTER — Encounter: Payer: Self-pay | Admitting: Nurse Practitioner

## 2021-04-16 VITALS — BP 130/62 | HR 65 | Temp 98.4°F | Ht 64.0 in | Wt 339.4 lb

## 2021-04-16 DIAGNOSIS — Z1321 Encounter for screening for nutritional disorder: Secondary | ICD-10-CM

## 2021-04-16 DIAGNOSIS — Z13228 Encounter for screening for other metabolic disorders: Secondary | ICD-10-CM | POA: Diagnosis not present

## 2021-04-16 DIAGNOSIS — M069 Rheumatoid arthritis, unspecified: Secondary | ICD-10-CM | POA: Diagnosis not present

## 2021-04-16 DIAGNOSIS — Z130 Encounter for screening for diseases of the blood and blood-forming organs and certain disorders involving the immune mechanism: Secondary | ICD-10-CM

## 2021-04-16 DIAGNOSIS — Z0000 Encounter for general adult medical examination without abnormal findings: Secondary | ICD-10-CM

## 2021-04-16 DIAGNOSIS — Z794 Long term (current) use of insulin: Secondary | ICD-10-CM

## 2021-04-16 DIAGNOSIS — I10 Essential (primary) hypertension: Secondary | ICD-10-CM

## 2021-04-16 DIAGNOSIS — E119 Type 2 diabetes mellitus without complications: Secondary | ICD-10-CM

## 2021-04-16 DIAGNOSIS — Z7689 Persons encountering health services in other specified circumstances: Secondary | ICD-10-CM

## 2021-04-16 DIAGNOSIS — Z1329 Encounter for screening for other suspected endocrine disorder: Secondary | ICD-10-CM

## 2021-04-16 DIAGNOSIS — Z780 Asymptomatic menopausal state: Secondary | ICD-10-CM

## 2021-04-16 DIAGNOSIS — E785 Hyperlipidemia, unspecified: Secondary | ICD-10-CM | POA: Diagnosis not present

## 2021-04-16 NOTE — Telephone Encounter (Signed)
pt never had sleep study before.  ?

## 2021-04-16 NOTE — Progress Notes (Signed)
? ?New Patient Office Visit ? ?Subjective:  ?[name], female    DOB: 03-03-1970  Age: 51 y.o. MRN: [id number] ? ?CC:  ?Chief Complaint  ?Patient presents with  ? New Patient (Initial Visit)  ? ? ?HPI ?Elizabeth Patrick presents to establish new primary care provider.  ?-unsure about menopause. Does not have periods due to hysterectomy at 37. Still has one ovary.  ?=feels hot, getting night sweats, notes that she has been crying all the time. This has been going on foe 8 months. States that this has been much worse over past four months.  ?-has been diagnosed with diabetes. She is currently is prescribed ozempic, was just approved for patient assistance. Is awaiting the first disbursement to prior pcp. In the meantime, she is taking metformin.  Can't wait until she gets off of this. Makes her feel a bit "woozy."  ? ?Past Medical History:  ?Diagnosis Date  ? RA (rheumatoid arthritis) ([place])   ? ? ?Past Surgical History:  ?Procedure Laterality Date  ? ABDOMINAL HYSTERECTOMY    ? ? ?Family History  ?Problem Relation Age of Onset  ? Obesity Mother   ? Coronary artery disease Mother   ? Bipolar disorder Mother   ? Multiple sclerosis Mother   ? Hypertension Father   ? Heart attack Father   ? Vascular Disease Father   ? ? ?Social History  ? ?Socioeconomic History  ? Marital status: Married  ?  Spouse name: Not on file  ? Number of children: Not on file  ? Years of education: Not on file  ? Highest education level: Not on file  ?Occupational History  ? Not on file  ?Tobacco Use  ? Smoking status: Never  ? Smokeless tobacco: Never  ?Substance and Sexual Activity  ? Alcohol use: Not Currently  ? Drug use: Never  ? Sexual activity: Not Currently  ?  Partners: Male  ?  Birth control/protection: Surgical  ?Other Topics Concern  ? Not on file  ?Social History Narrative  ? Not on file  ? ?Social Determinants of Health  ? ?Financial Resource Strain: Low Risk   ? Difficulty of Paying Living Expenses: Not hard at all   ?Food Insecurity: No Food Insecurity  ? Worried About Charity fundraiser in the Last Year: Never true  ? Ran Out of Food in the Last Year: Never true  ?Transportation Needs: No Transportation Needs  ? Lack of Transportation (Medical): No  ? Lack of Transportation (Non-Medical): No  ?Physical Activity: Sufficiently Active  ? Days of Exercise per Week: 5 days  ? Minutes of Exercise per Session: 50 min  ?Stress: No Stress Concern Present  ? Feeling of Stress : Only a little  ?Social Connections: Moderately Isolated  ? Frequency of Communication with Friends and Family: More than three times a week  ? Frequency of Social Gatherings with Friends and Family: More than three times a week  ? Attends Religious Services: Never  ? Active Member of Clubs or Organizations: No  ? Attends Archivist Meetings: Never  ? Marital Status: Married  ?Intimate Partner Violence: Not At Risk  ? Fear of Current or Ex-Partner: No  ? Emotionally Abused: No  ? Physically Abused: No  ? Sexually Abused: No  ? ? ?ROS ?Review of Systems  ?Constitutional:  Positive for fatigue. Negative for activity change, appetite change, chills and fever.  ?HENT:  Negative for congestion, postnasal drip, rhinorrhea, sinus pressure, sinus pain, sneezing  and sore throat.   ?Eyes: Negative.   ?Respiratory:  Negative for cough, chest tightness, shortness of breath and wheezing.   ?Cardiovascular:  Positive for leg swelling. Negative for chest pain and palpitations.  ?     Chronic lymphedema  ?Gastrointestinal:  Negative for abdominal pain, constipation, diarrhea, nausea and vomiting.  ?Endocrine: Negative for cold intolerance, heat intolerance, polydipsia and polyuria.  ?     Diagnosis of type 2 diabetes - has been on ozempic and did very well. Currently on metformin twice daily until supply ozempic comes to prior provider's office.   ?Genitourinary:  Negative for dyspareunia, dysuria, flank pain, frequency and urgency.  ?Musculoskeletal:  Positive for  arthralgias. Negative for back pain and myalgias.  ?Skin:  Negative for rash.  ?Allergic/Immunologic: Negative for environmental allergies.  ?Neurological:  Negative for dizziness, weakness and headaches.  ?Hematological:  Negative for adenopathy.  ?Psychiatric/Behavioral:  Positive for dysphoric mood. The patient is not nervous/anxious.   ? ?Objective:  ? ?Today's Vitals  ? 04/16/21 1120  ?BP: 130/62  ?Pulse: 65  ?Temp: 98.4 ?F (36.9 ?C)  ?SpO2: 96%  ?Weight: (!) 339 lb 6.4 oz (154 kg)  ?Height: $RemoveB'5\' 4"'GVuBqwtb$  (1.626 m)  ? ?Body mass index is 58.26 kg/m?.  ? ?Physical Exam ?Vitals and nursing note reviewed.  ?Constitutional:   ?   Appearance: Normal appearance. She is well-developed. She is obese.  ?HENT:  ?   Head: Normocephalic and atraumatic.  ?   Nose: Nose normal.  ?   Mouth/Throat:  ?   Mouth: Mucous membranes are moist.  ?   Pharynx: Oropharynx is clear.  ?Eyes:  ?   Extraocular Movements: Extraocular movements intact.  ?   Conjunctiva/sclera: Conjunctivae normal.  ?   Pupils: Pupils are equal, round, and reactive to light.  ?Cardiovascular:  ?   Rate and Rhythm: Normal rate and regular rhythm.  ?   Pulses: Normal pulses.  ?   Heart sounds: Normal heart sounds.  ?   Comments: Non pitting edema present in both lower legs, feet, and ankles. Diminished pedal pulses present.  ?Pulmonary:  ?   Effort: Pulmonary effort is normal.  ?   Breath sounds: Normal breath sounds.  ?Abdominal:  ?   Palpations: Abdomen is soft.  ?Musculoskeletal:     ?   General: Normal range of motion.  ?   Cervical back: Normal range of motion and neck supple.  ?Lymphadenopathy:  ?   Cervical: No cervical adenopathy.  ?Skin: ?   General: Skin is warm and dry.  ?   Capillary Refill: Capillary refill takes less than 2 seconds.  ?Neurological:  ?   General: No focal deficit present.  ?   Mental Status: She is alert and oriented to person, place, and time.  ?Psychiatric:     ?   Attention and Perception: Attention and perception normal.     ?   Mood  and Affect: Mood is anxious and depressed. Affect is tearful.     ?   Speech: Speech normal.     ?   Behavior: Behavior normal. Behavior is cooperative.     ?   Thought Content: Thought content normal.     ?   Cognition and Memory: Cognition and memory normal.     ?   Judgment: Judgment normal.  ? ? ?Assessment & Plan:  ?1. Type 2 diabetes mellitus without complication, with long-term current use of insulin ([place]) ?Check labs along with HgbA1c today. Adjust medication  as indicated. Refer to nutrition services for further management.  ?- Ambulatory Referral to DSME/T ? ?2. Primary hypertension ?Bp stable. Continue regular medication as prescribed. Refer to nutrition services. Continue regular visits with cardiology.  ?- Ambulatory Referral to DSME/T ? ?3. Hyperlipidemia, unspecified hyperlipidemia type ?Currently on atorvastatin and tricor. No changes to medication today  ?- Ambulatory Referral to DSME/T ? ?4. Rheumatoid arthritis, involving unspecified site, unspecified whether rheumatoid factor present ([place]) ?Continue regular visits with rheumatology. Refer to nutrition services for management.  ?- Ambulatory Referral to DSME/T ? ?5. Screening for endocrine, nutritional, metabolic and immunity disorder ?Routine, fasting labs drawn during today's visit.  ?- Comp Met (CMET); Future ?- CBC with Differential/Platelet; Future ?- Hemoglobin A1c; Future ?- TSH; Future ?- Hemoglobin A1c ?- TSH ?- CBC with Differential/Platelet ?- Comp Met (CMET) ? ?6. Menopause ?Check reproductive hormones for further evaluation. Discuss results at next visit.  ?- FSH/LH; Future ?- Hormone Panel; Future ?- T4, free; Future ?- T4, free ?- Hormone Panel ?- FSH/LH ? ?7. Encounter to establish care ?Appointment today to establish new primary care provider   ? ?8. Healthcare maintenance ?Routine, fasting labs drawn during today's visit.  ?- Comp Met (CMET); Future ?- CBC with Differential/Platelet; Future ?- Hemoglobin A1c; Future ?- TSH;  Future ?- Hemoglobin A1c ?- TSH ?- CBC with Differential/Platelet ?- Comp Met (CMET)  ? ? ?Problem List Items Addressed This Visit   ? ?  ? Cardiovascular and Mediastinum  ? Primary hypertension  ? Relevant [place]

## 2021-04-17 ENCOUNTER — Encounter: Payer: Self-pay | Admitting: Primary Care

## 2021-04-17 ENCOUNTER — Other Ambulatory Visit: Payer: Medicare Other

## 2021-04-17 ENCOUNTER — Ambulatory Visit (INDEPENDENT_AMBULATORY_CARE_PROVIDER_SITE_OTHER): Payer: Medicare Other | Admitting: Primary Care

## 2021-04-17 VITALS — BP 118/66 | HR 60 | Temp 98.2°F | Ht 64.0 in | Wt 340.4 lb

## 2021-04-17 DIAGNOSIS — R0683 Snoring: Secondary | ICD-10-CM | POA: Diagnosis not present

## 2021-04-17 NOTE — Progress Notes (Signed)
? ?'@Patient'$  ID: Elizabeth Patrick, female    DOB: 1970/05/03, 51 y.o.   MRN: [id number] ? ?Chief Complaint  ?Patient presents with  ? Sleep Consult  ?  Referred by Dr. Mylo Red- Charl[facility] Night. Pt c/o snoring and waking up every few hours. She has never had a sleep study. Epworth score = 14.   ? ? ?Referring provider: ?Kate Sable, MD ? ?HPI: ?51 year old female, former smoker. PMH significant for HTN, type 2 diabetes, neuropathy, hyperlipidemia.  ? ?04/17/2021 ?Patient presents today for sleep consult. She has symptoms of loud snoring, restless sleep, witnessed apnea and excessive daytime sleepiness. She wakes up every 2-4 hours to change position or use the restroom. She is unsure if she is waking up d/t snoring or pain from RA. Typical bedtime is between 8:30-10pm. It does not take her long to fall asleep. She wakes up on average 3-5 times a night. She starts her day at 4:45-5am. She falls asleep easily during the day after meals or sitting on the couch. She has had these symptoms for several years. She has tried sleeping with wedge pillow. Her father had sleep apnea. She is very unsure if she would be able to use CPAP. She has lost 55 lbs since September 2022. Her goal weight 230lbs, BMI < 40. She is hoping to have double knee replacements. She was supposed to have an echocardiogram today but needs to rescheduled. No previous sleep study. Epworth 14. ? ? ?Allergies  ?Allergen Reactions  ? Alcohol Anaphylaxis  ? Dilantin [Phenytoin] Anaphylaxis  ? ? ?Immunization History  ?Administered Date(s) Administered  ? Influenza,inj,Quad PF,6+ Mos 11/23/2020  ? Moderna Sars-Covid-2 Vaccination 05/31/2019, 06/28/2019, 01/14/2020  ? ? ?Past Medical History:  ?Diagnosis Date  ? RA (rheumatoid arthritis) ([place])   ? ? ?Tobacco History: ?Social History  ? ?Tobacco Use  ?Smoking Status Former  ? Packs/day: 1.00  ? Years: 10.00  ? Pack years: 10.00  ? Types: Cigarettes  ? Quit date: 01/14/2005  ? Years since quitting: 16.2  ?Smokeless Tobacco  Never  ? ?Counseling given: Not Answered ? ? ?Outpatient Medications Prior to Visit  ?Medication Sig Dispense Refill  ? amLODipine (NORVASC) 10 MG tablet 1 tablet    ? atorvastatin (LIPITOR) 10 MG tablet Take 1 tablet (10 mg total) by mouth daily. 90 tablet 3  ? Cholecalciferol (D3 2000 PO) Take 2 tablets by mouth.    ? Cranberry 1000 MG CAPS     ? esomeprazole (NEXIUM) 40 MG capsule Take 40 mg by mouth every other day.    ? estradiol (ESTRACE) 0.1 MG/GM vaginal cream Estrogen Cream Instruction Discard applicator Apply pea sized amount to tip of finger to urethra before bed. Wash hands well after application. Use Monday, Wednesday and Friday 42.5 g 11  ? fenofibrate (TRICOR) 145 MG tablet TAKE 1 TABLET(145 MG) BY MOUTH DAILY 90 tablet 0  ? furosemide (LASIX) 20 MG tablet Take 20 mg by mouth as needed.    ? gabapentin (NEURONTIN) 800 MG tablet Take 800 mg by mouth 3 (three) times daily.    ? Lidocaine (BLUE-EMU PAIN RELIEF DRY EX) Apply topically.    ? losartan (COZAAR) 100 MG tablet TAKE 1 TABLET(100 MG) BY MOUTH DAILY 90 tablet 0  ? metFORMIN (GLUCOPHAGE) 500 MG tablet Take 1 tablet (500 mg total) by mouth 2 (two) times daily with a meal. 90 tablet 3  ? metoprolol succinate (TOPROL-XL) 25 MG 24 hr tablet TAKE 1 TABLET(25 MG) BY MOUTH DAILY 30 tablet  3  ? SUPER B COMPLEX/C PO Take by mouth.    ? tiZANidine (ZANAFLEX) 2 MG tablet Take by mouth every 6 (six) hours as needed for muscle spasms.    ? Tocilizumab (ACTEMRA IV) Inject into the vein.    ? triamcinolone ointment (KENALOG) 0.1 % Apply 1 application topically 3 (three) times daily. 30 g 2  ? ?No facility-administered medications prior to visit.  ? ?Review of Systems ? ?Review of Systems  ?Constitutional:  Positive for fatigue.  ?HENT: Negative.    ?Cardiovascular: Negative.   ? ? ?Physical Exam ? ?BP 118/66 (BP Location: Left Arm, Cuff Size: Large)   Pulse 60   Temp 98.2 ?F (36.8 ?C) (Oral)   Ht '5\' 4"'$  (1.626 m)   Wt (!) 340 lb 6.4 oz (154.4 kg)   SpO2 96%  Comment: on RA  BMI 58.43 kg/m?  ?Physical Exam ?Constitutional:   ?   General: She is not in acute distress. ?   Appearance: Normal appearance. She is obese. She is not ill-appearing.  ?HENT:  ?   Head: Normocephalic and atraumatic.  ?   Mouth/Throat:  ?   Mouth: Mucous membranes are moist.  ?   Pharynx: Oropharynx is clear.  ?   Comments: Mallampati class II ?Cardiovascular:  ?   Rate and Rhythm: Normal rate and regular rhythm.  ?Pulmonary:  ?   Effort: Pulmonary effort is normal.  ?   Breath sounds: Normal breath sounds.  ?Musculoskeletal:     ?   General: Normal range of motion.  ?Skin: ?   General: Skin is warm and dry.  ?Neurological:  ?   General: No focal deficit present.  ?   Mental Status: She is alert and oriented to person, place, and time. Mental status is at baseline.  ?Psychiatric:     ?   Mood and Affect: Mood normal.     ?   Behavior: Behavior normal.     ?   Thought Content: Thought content normal.     ?   Judgment: Judgment normal.  ?  ? ?Lab Results: ? ?CBC ?   ?Component Value Date/Time  ? WBC 5.3 04/16/2021 1154  ? RBC 4.82 04/16/2021 1154  ? RBC 4.96 12/29/2020 0000  ? HGB 16.1 (H) 04/16/2021 1154  ? HCT 46.0 04/16/2021 1154  ? PLT 233 04/16/2021 1154  ? MCV 95 04/16/2021 1154  ? MCH 33.4 (H) 04/16/2021 1154  ? MCHC 35.0 04/16/2021 1154  ? RDW 12.7 04/16/2021 1154  ? LYMPHSABS 2.1 04/16/2021 1154  ? EOSABS 0.1 04/16/2021 1154  ? BASOSABS 0.0 04/16/2021 1154  ? ? ?BMET ?   ?Component Value Date/Time  ? NA 142 04/16/2021 1154  ? K 4.0 04/16/2021 1154  ? CL 101 04/16/2021 1154  ? CO2 23 04/16/2021 1154  ? GLUCOSE 101 (H) 04/16/2021 1154  ? BUN 15 04/16/2021 1154  ? CREATININE 0.81 04/16/2021 1154  ? CALCIUM 9.9 04/16/2021 1154  ? ? ?BNP ?No results found for: BNP ? ?ProBNP ?No results found for: PROBNP ? ?Imaging: ?VAS [place] LOWER EXTREMITY VENOUS REFLUX ? ?Result Date: 03/29/2021 ? Lower Venous Reflux Study Patient Name:  Elizabeth Patrick  Date of Exam:   03/28/2021 Medical Rec #: [id number]          Accession #:    [id number] Date of Birth: 06-28-1970         Patient Gender: F Patient Age:   51 years Exam Location:  [facility] Procedure:  VAS [place] LOWER EXTREMITY VENOUS REFLUX Referring Phys: BRIAN AGBOR-ETANG --------------------------------------------------------------------------------  Indications: Edema. Patient with longstanding leg edema, with the left leg usually more swollen the the right. She was diagnosed with lymphedema years ago and seen at a lymphedema clinic in [place] but without much improvement in symptoms. Left leg swelling noticed over the past several weeks with varicose veins present.  Risk Factors: Obesity. Limitations: Body habitus and poor ultrasound/tissue interface. Performing Technologist: Mariane Masters RVT  Examination Guidelines: A complete evaluation includes B-mode imaging, spectral Doppler, color Doppler, and power Doppler as needed of all accessible portions of each vessel. Bilateral testing is considered an integral part of a complete examination. Limited examinations for reoccurring indications may be performed as noted. The reflux portion of the exam is performed with the patient in reverse Trendelenburg. Significant venous reflux is defined as >500 ms in the superficial venous system, and >1 second in the deep venous system.  Venous Reflux Times +--------------+---------+------+-----------+------------+--------+ RIGHT         Reflux NoRefluxReflux TimeDiameter cmsComments                         Yes                                  +--------------+---------+------+-----------+------------+--------+ CFV           no                                             +--------------+---------+------+-----------+------------+--------+ FV prox                 yes                                  +--------------+---------+------+-----------+------------+--------+ FV mid        no                                              +--------------+---------+------+-----------+------------+--------+ FV dist       no                                             +--------------+---------+------+-----------+------------+--------+ Popliteal     no                                             +

## 2021-04-17 NOTE — Assessment & Plan Note (Addendum)
-   Patient has symptoms of loud snoring, restless sleep, witnessed apnea and daytime sleepiness. Epworth 14. BMI 58. Strong concern patient could have obstructive sleep apnea, needs home sleep study to evaluate. Discussed risk of untreated sleep apnea including cardiac arrhythmias, pulm HTN, stroke, DM. We briefly reviewed treatment options. Encouraged patient to work on weight loss efforts and focus on side sleeping position/elevate head of bed. Advised against driving if experiencing excessive daytime sleepiness. Follow-up in 4-6 weeks to review sleep study results and discuss treatment options further. ?

## 2021-04-17 NOTE — Progress Notes (Signed)
Reviewed and agree with assessment/plan. ? ? ?Chesley Mires, MD ?[place] ?04/17/2021, 10:50 AM ?Pager:  [phone] ? ?

## 2021-04-17 NOTE — Progress Notes (Signed)
Waiting on all results

## 2021-04-17 NOTE — Assessment & Plan Note (Signed)
-   Patient is working on weight loss. She has lost 55lbs since Sept 2022. Awaiting to be started on Ozempic. Goal weight 240lbs, BMI <40.  ?

## 2021-04-17 NOTE — Patient Instructions (Signed)
Sleep apnea is defined as period of 10 seconds or longer when you stop breathing at night. This can happen multiple times a night. Dx sleep apnea is when this occurs more than 5 times an hour.  ?  ?Mild OSA 5-15 apneic events an hour ?Moderate OSA 15-30 apneic events an hour ?Severe OSA > 30 apneic events an hour ?  ?Untreated sleep apnea puts you at higher risk for cardiac arrhythmias, pulmonary HTN, stroke and diabetes ?  ?Treatment options include weight loss, side sleeping position, oral appliance, CPAP therapy or referral to ENT for possible surgical options  ?  ?Recommendations: ?Focus on side sleeping position ?Work on weight loss efforts  ?Do not drive if experiencing excessive daytime sleepiness of fatigue  ?  ?Orders: ?Home sleep study re: snoring  ?  ?Follow-up: ?Call after sleep study to set up follow-up to discuss treatment options further ? ? ?Sleep Apnea ?Sleep apnea affects breathing during sleep. It causes breathing to stop for 10 seconds or more, or to become shallow. People with sleep apnea usually snore loudly. ?It can also increase the risk of: ?Heart attack. ?Stroke. ?Being very overweight (obese). ?Diabetes. ?Heart failure. ?Irregular heartbeat. ?High blood pressure. ?The goal of treatment is to help you breathe normally again. ?What are the causes? ?The most common cause of this condition is a collapsed or blocked airway. ?There are three kinds of sleep apnea: ?Obstructive sleep apnea. This is caused by a blocked or collapsed airway. ?Central sleep apnea. This happens when the brain does not send the right signals to the muscles that control breathing. ?Mixed sleep apnea. This is a combination of obstructive and central sleep apnea. ?What increases the risk? ?Being overweight. ?Smoking. ?Having a small airway. ?Being older. ?Being female. ?Drinking alcohol. ?Taking medicines to calm yourself (sedatives or tranquilizers). ?Having family members with the condition. ?Having a tongue or tonsils  that are larger than normal. ?What are the signs or symptoms? ?Trouble staying asleep. ?Loud snoring. ?Headaches in the morning. ?Waking up gasping. ?Dry mouth or sore throat in the morning. ?Being sleepy or tired during the day. ?If you are sleepy or tired during the day, you may also: ?Not be able to focus your mind (concentrate). ?Forget things. ?Get angry a lot and have mood swings. ?Feel sad (depressed). ?Have changes in your personality. ?Have less interest in sex, if you are female. ?Be unable to have an erection, if you are female. ?How is this treated? ? ?Sleeping on your side. ?Using a medicine to get rid of mucus in your nose (decongestant). ?Avoiding the use of alcohol, medicines to help you relax, or certain pain medicines (narcotics). ?Losing weight, if needed. ?Changing your diet. ?Quitting smoking. ?Using a machine to open your airway while you sleep, such as: ?An oral appliance. This is a mouthpiece that shifts your lower jaw forward. ?A CPAP device. This device blows air through a mask when you breathe out (exhale). ?An EPAP device. This has valves that you put in each nostril. ?A BIPAP device. This device blows air through a mask when you breathe in (inhale) and breathe out. ?Having surgery if other treatments do not work. ?Follow these instructions at home: ?Lifestyle ?Make changes that your doctor recommends. ?Eat a healthy diet. ?Lose weight if needed. ?Avoid alcohol, medicines to help you relax, and some pain medicines. ?Do not smoke or use any products that contain nicotine or tobacco. If you need help quitting, ask your doctor. ?General instructions ?Take over-the-counter and prescription medicines only  as told by your doctor. ?If you were given a machine to use while you sleep, use it only as told by your doctor. ?If you are having surgery, make sure to tell your doctor you have sleep apnea. You may need to bring your device with you. ?Keep all follow-up visits. ?Contact a doctor if: ?The  machine that you were given to use during sleep bothers you or does not seem to be working. ?You do not get better. ?You get worse. ?Get help right away if: ?Your chest hurts. ?You have trouble breathing in enough air. ?You have an uncomfortable feeling in your back, arms, or stomach. ?You have trouble talking. ?One side of your body feels weak. ?A part of your face is hanging down. ?These symptoms may be an emergency. Get help right away. Call your local emergency services (911 in the [place] ?Do not wait to see if the symptoms will go away. ?Do not drive yourself to the hospital. ?Summary ?This condition affects breathing during sleep. ?The most common cause is a collapsed or blocked airway. ?The goal of treatment is to help you breathe normally while you sleep. ?This information is not intended to replace advice given to you by your health care provider. Make sure you discuss any questions you have with your health care provider. ?Document Revised: 08/09/2020 Document Reviewed: 12/10/2019 ?Elsevier Patient Education ? [place]. ? ?

## 2021-04-17 NOTE — Patient Outreach (Signed)
Received a primary care physician change notification from Noreene Larsson, RN ?I have assigned Deloria Lair, NP to call for follow up and continue Care Management as needed. ?  ?[place]ville Care, CBCS, CMAA ?[facility] Management Assistant ?[facility] ?[phone]   ? ?

## 2021-04-17 NOTE — Chronic Care Management (AMB) (Signed)
?  Care Management  ? ?Note ? ?04/17/2021 ?Name: Elizabeth Patrick MRN: [id number] DOB: March 09, 1970 ? ?Elizabeth Patrick is a 51 y.o. year old female who is a primary care patient of Ronnell Freshwater, NP and is actively engaged with the care management team. I reached out to [employer] by phone today to assist with re-scheduling a follow up visit with the Licensed Clinical Social Worker ? ?Follow up plan: ?Patient declines further follow up and engagement by the care management team. Appropriate care team members and provider have been notified via electronic communication.  ?Patient has changed PCP. ? ?Noreene Larsson, RMA ?Care Guide, Embedded Care Coordination ?[facility]  ?[place], [place] [address] ?Direct Dial: [phone] ?Museum/gallery [email or web address] ?Website: [email or web address]  ? ?

## 2021-04-19 ENCOUNTER — Telehealth: Payer: Self-pay

## 2021-04-19 NOTE — Telephone Encounter (Signed)
-----   Message from [name], MD sent at 04/12/2021 10:34 AM EDT ----- ?Ceruloplasmin still low can we get 24 hour urinary copper please ?

## 2021-04-19 NOTE — Telephone Encounter (Signed)
Order labs called and left a message for call back sent mychart message  ?

## 2021-04-22 NOTE — Progress Notes (Signed)
Labs generally sable from prior checks. Review with patient during visit 4/10

## 2021-04-23 ENCOUNTER — Telehealth: Payer: Medicare Other

## 2021-04-23 ENCOUNTER — Encounter: Payer: Self-pay | Admitting: Nurse Practitioner

## 2021-04-23 ENCOUNTER — Ambulatory Visit (INDEPENDENT_AMBULATORY_CARE_PROVIDER_SITE_OTHER): Payer: Medicare Other | Admitting: Nurse Practitioner

## 2021-04-23 VITALS — BP 119/69 | HR 63 | Temp 97.7°F | Ht 64.17 in | Wt 339.1 lb

## 2021-04-23 DIAGNOSIS — Z780 Asymptomatic menopausal state: Secondary | ICD-10-CM | POA: Diagnosis not present

## 2021-04-23 DIAGNOSIS — M069 Rheumatoid arthritis, unspecified: Secondary | ICD-10-CM | POA: Diagnosis not present

## 2021-04-23 DIAGNOSIS — F321 Major depressive disorder, single episode, moderate: Secondary | ICD-10-CM

## 2021-04-23 DIAGNOSIS — E119 Type 2 diabetes mellitus without complications: Secondary | ICD-10-CM

## 2021-04-23 DIAGNOSIS — Z794 Long term (current) use of insulin: Secondary | ICD-10-CM

## 2021-04-23 DIAGNOSIS — I10 Essential (primary) hypertension: Secondary | ICD-10-CM

## 2021-04-23 MED ORDER — DULOXETINE HCL 20 MG PO CPEP: 20.0000 mg | ORAL_CAPSULE | Freq: Every day | ORAL | 1 refills | Status: DC

## 2021-04-23 NOTE — Progress Notes (Signed)
Established patient visit ? ? ?Patient: Elizabeth Patrick   DOB: 01/07/1971   51 y.o. Female  MRN: [id number] ?Visit Date: 04/23/2021 ? ? ?Chief Complaint  ?Patient presents with  ? Follow-up  ? ?Subjective  ?  ?HPI  ?The patient is here for follow up visit.  ?-hormone panel indicates she is post menopausal. ?-has had partial hysterectomy. She does have one ovary and a cervical cuff. She is due to have a CPE with pap.  ?-concerned about mental health.  ?--crying over everything. Cries when she is happy and sad. States that she cried so much the other day, her eyes were red and sore and she had to lay down.  ?-does not feel depressed. Does not feel hopeless or helpless. States that she feels better than she did prior to moving to [place].  ?-liver functions were a little elevated. 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 Sees Dr. Vicente Males, GI in [place].  ?-HgbA1c indicates non-diabetic. She has ozempic on the way from [place]. Until she receives it, she is taking metformin. Will d/c metformin after restarts ozempic. Her HgbA1c is 5.6.  ? ? ? ?Medications: ?Outpatient Medications Prior to Visit  ?Medication Sig  ? amLODipine (NORVASC) 10 MG tablet 1 tablet  ? atorvastatin (LIPITOR) 10 MG tablet Take 1 tablet (10 mg total) by mouth daily.  ? Cholecalciferol (D3 2000 PO) Take 2 tablets by mouth.  ? Cranberry 1000 MG CAPS   ? esomeprazole (NEXIUM) 40 MG capsule Take 40 mg by mouth every other day.  ? estradiol (ESTRACE) 0.1 MG/GM vaginal cream Estrogen Cream Instruction Discard applicator Apply pea sized amount to tip of finger to urethra before bed. Wash hands well after application. Use Monday, Wednesday and Friday  ? fenofibrate (TRICOR) 145 MG tablet TAKE 1 TABLET(145 MG) BY MOUTH DAILY  ? furosemide (LASIX) 20 MG tablet Take 20 mg by mouth as needed.  ? gabapentin (NEURONTIN) 800 MG tablet Take 800 mg by mouth 3 (three) times daily.  ? Lidocaine (BLUE-EMU PAIN RELIEF DRY EX) Apply topically.  ?  losartan (COZAAR) 100 MG tablet TAKE 1 TABLET(100 MG) BY MOUTH DAILY  ? metFORMIN (GLUCOPHAGE) 500 MG tablet Take 1 tablet (500 mg total) by mouth 2 (two) times daily with a meal.  ? metoprolol succinate (TOPROL-XL) 25 MG 24 hr tablet TAKE 1 TABLET(25 MG) BY MOUTH DAILY  ? SUPER B COMPLEX/C PO Take by mouth.  ? tiZANidine (ZANAFLEX) 2 MG tablet Take by mouth every 6 (six) hours as needed for muscle spasms.  ? Tocilizumab (ACTEMRA IV) Inject into the vein.  ? triamcinolone ointment (KENALOG) 0.1 % Apply 1 application topically 3 (three) times daily.  ? ?No facility-administered medications prior to visit.  ? ? ?Review of Systems  ?Constitutional:  Positive for activity change. Negative for appetite change, chills, fatigue and fever.  ?HENT:  Negative for congestion, postnasal drip, rhinorrhea, sinus pressure, sinus pain, sneezing and sore throat.   ?Eyes: Negative.   ?Respiratory:  Negative for cough, chest tightness, shortness of breath and wheezing.   ?Cardiovascular:  Negative for chest pain and palpitations.  ?Gastrointestinal:  Negative for abdominal pain, constipation, diarrhea, nausea and vomiting.  ?Endocrine: Negative for cold intolerance, heat intolerance, polydipsia and polyuria.  ?Genitourinary:  Negative for dyspareunia, dysuria, flank pain, frequency and urgency.  ?Musculoskeletal:  Positive for arthralgias and myalgias. Negative for back pain.  ?Skin:  Negative for rash.  ?Allergic/Immunologic: Negative for environmental allergies.  ?Neurological:  Negative for dizziness, weakness  and headaches.  ?Hematological:  Negative for adenopathy.  ?Psychiatric/Behavioral:  The patient is not nervous/anxious.   ? ?Last CBC ?Lab Results  ?Component Value Date  ? WBC 5.3 04/16/2021  ? HGB 16.1 (H) 04/16/2021  ? HCT 46.0 04/16/2021  ? MCV 95 04/16/2021  ? MCH 33.4 (H) 04/16/2021  ? RDW 12.7 04/16/2021  ? PLT 233 04/16/2021  ? ?Last metabolic panel ?Lab Results  ?Component Value Date  ? GLUCOSE 101 (H) 04/16/2021  ?  NA 142 04/16/2021  ? K 4.0 04/16/2021  ? CL 101 04/16/2021  ? CO2 23 04/16/2021  ? BUN 15 04/16/2021  ? CREATININE 0.81 04/16/2021  ? EGFR 87 04/16/2021  ? CALCIUM 9.9 04/16/2021  ? PROT 6.9 04/16/2021  ? ALBUMIN 5.0 (H) 04/16/2021  ? LABGLOB 1.9 04/16/2021  ? AGRATIO 2.6 (H) 04/16/2021  ? BILITOT 0.6 04/16/2021  ? ALKPHOS 47 04/16/2021  ? AST 33 04/16/2021  ? ALT 55 (H) 04/16/2021  ? ?Last lipids ?Lab Results  ?Component Value Date  ? CHOL 151 03/05/2021  ? HDL 61 03/05/2021  ? [place] 73 03/05/2021  ? TRIG 89 03/05/2021  ? CHOLHDL 2.5 03/05/2021  ? ?Last hemoglobin A1c ?Lab Results  ?Component Value Date  ? HGBA1C 5.6 04/16/2021  ? ?Last thyroid functions ?Lab Results  ?Component Value Date  ? TSH 0.895 04/16/2021  ? ?  ? ? Objective  ?  ? ?Today's Vitals  ? 04/23/21 1036  ?BP: 119/69  ?Pulse: 63  ?Temp: 97.7 ?F (36.5 ?C)  ?SpO2: 97%  ?Weight: (!) 339 lb 1.9 oz (153.8 kg)  ?Height: 5' 4.17" (1.63 m)  ? ?Body mass index is 57.9 kg/m?.  ? ?BP Readings from Last 3 Encounters:  ?04/23/21 119/69  ?04/17/21 118/66  ?04/16/21 130/62  ?  ?Wt Readings from Last 3 Encounters:  ?04/23/21 (!) 339 lb 1.9 oz (153.8 kg)  ?04/17/21 (!) 340 lb 6.4 oz (154.4 kg)  ?04/16/21 (!) 339 lb 6.4 oz (154 kg)  ?  ?Physical Exam ?Vitals and nursing note reviewed.  ?Constitutional:   ?   Appearance: Normal appearance. She is well-developed. She is obese.  ?HENT:  ?   Head: Normocephalic and atraumatic.  ?   Nose: Nose normal.  ?   Mouth/Throat:  ?   Mouth: Mucous membranes are moist.  ?   Pharynx: Oropharynx is clear.  ?Eyes:  ?   Extraocular Movements: Extraocular movements intact.  ?   Conjunctiva/sclera: Conjunctivae normal.  ?   Pupils: Pupils are equal, round, and reactive to light.  ?Cardiovascular:  ?   Rate and Rhythm: Normal rate and regular rhythm.  ?   Pulses: Normal pulses.  ?   Heart sounds: Normal heart sounds.  ?Pulmonary:  ?   Effort: Pulmonary effort is normal.  ?   Breath sounds: Normal breath sounds.  ?Abdominal:  ?    Palpations: Abdomen is soft.  ?Musculoskeletal:     ?   General: Normal range of motion.  ?   Cervical back: Normal range of motion and neck supple.  ?Lymphadenopathy:  ?   Cervical: No cervical adenopathy.  ?Skin: ?   General: Skin is warm and dry.  ?   Capillary Refill: Capillary refill takes less than 2 seconds.  ?Neurological:  ?   General: No focal deficit present.  ?   Mental Status: She is alert and oriented to person, place, and time.  ?Psychiatric:     ?   Mood and Affect: Mood normal.     ?  Behavior: Behavior normal.     ?   Thought Content: Thought content normal.     ?   Judgment: Judgment normal.  ?  ? ? Assessment & Plan  ?  ?1. Type 2 diabetes mellitus without complication, with long-term current use of insulin ([place]) ?Most recent hemoglobin A1c done 04/16/2021.  Was 5.6.  Continue metformin until arrival of Ozempic.  Patient will then restart Ozempic at 0.25 mg weekly and titrate every 3 days as indicated and as tolerated.  Refer to ophthalmology for diabetic eye exam. ?- Ambulatory referral to Ophthalmology ? ?2. Depression, major, single episode, moderate ([place]) ?Start Cymbalta 20 mg daily.  Reassess in 4 weeks. ?- DULoxetine (CYMBALTA) 20 MG capsule; Take 1 capsule (20 mg total) by mouth daily.  Dispense: 30 capsule; Refill: 1 ? ?3. Menopause ?Current depression and other symptoms potentially due to menopause.  Trial Cymbalta 20 mg daily.  We will reassess in 4 weeks. ? ?4. Rheumatoid arthritis, involving unspecified site, unspecified whether rheumatoid factor present ([place]) ?Patient currently seeing rheumatology.  Trial Cymbalta 20 mg daily to help with generalized joint pain.  Reassess in 4 weeks. ? ?5. Primary hypertension ?Stable.  Continue blood pressure medication as prescribed. ? ?Problem List Items Addressed This Visit   ? ?  ? Cardiovascular and Mediastinum  ? Primary hypertension  ?  ? Endocrine  ? Type 2 diabetes mellitus without complication, with long-term current use of insulin ([place]) -  Primary  ? Relevant Orders  ? Ambulatory referral to Ophthalmology  ?  ? Musculoskeletal and Integument  ? Rheumatoid arthritis ([place])  ?  ? Other  ? Menopause  ? Depression, major, single episode, moderate ([place])

## 2021-04-23 NOTE — Progress Notes (Signed)
Reviewed with patient during visit

## 2021-04-26 LAB — HORMONE PANEL (T4,TSH,FSH,TESTT,SHBG,DHEA,ETC)
DHEA-Sulfate, LCMS: 19 ug/dL
Estradiol, Serum, MS: 18 pg/mL
Estrone Sulfate: 17 ng/dL
Follicle Stimulating Hormone: 72 m[IU]/mL
Free T-3: 2.8 pg/mL
Free Testosterone, Serum: 3.7 pg/mL
Progesterone, Serum: <10 ng/dL
Sex Hormone Binding Globulin: 89.8 nmol/L
T4: 7.9 ug/dL
TSH: 1.1 uU/mL
Testosterone, Serum (Total): 53 ng/dL
Testosterone-% Free: 0.7 %
Triiodothyronine (T-3), Serum: 85 ng/dL

## 2021-04-26 LAB — COMPREHENSIVE METABOLIC PANEL
ALT: 55 IU/L — ABNORMAL HIGH (ref 0–32)
AST: 33 IU/L (ref 0–40)
Albumin/Globulin Ratio: 2.6 — ABNORMAL HIGH (ref 1.2–2.2)
Albumin: 5 g/dL — ABNORMAL HIGH (ref 3.8–4.9)
Alkaline Phosphatase: 47 IU/L (ref 44–121)
BUN/Creatinine Ratio: 19 (ref 9–23)
BUN: 15 mg/dL (ref 6–24)
Bilirubin Total: 0.6 mg/dL (ref 0.0–1.2)
CO2: 23 mmol/L (ref 20–29)
Calcium: 9.9 mg/dL (ref 8.7–10.2)
Chloride: 101 mmol/L (ref 96–106)
Creatinine, Ser: 0.81 mg/dL (ref 0.57–1.00)
Globulin, Total: 1.9 g/dL (ref 1.5–4.5)
Glucose: 101 mg/dL — ABNORMAL HIGH (ref 70–99)
Potassium: 4 mmol/L (ref 3.5–5.2)
Sodium: 142 mmol/L (ref 134–144)
Total Protein: 6.9 g/dL (ref 6.0–8.5)
eGFR: 87 mL/min/{1.73_m2} (ref >59)

## 2021-04-26 LAB — CBC WITH DIFFERENTIAL/PLATELET
Basophils Absolute: 0 10*3/uL (ref 0.0–0.2)
Basos: 1 %
EOS (ABSOLUTE): 0.1 10*3/uL (ref 0.0–0.4)
Eos: 1 %
Hematocrit: 46 % (ref 34.0–46.6)
Hemoglobin: 16.1 g/dL — ABNORMAL HIGH (ref 11.1–15.9)
Immature Grans (Abs): 0 10*3/uL (ref 0.0–0.1)
Immature Granulocytes: 0 %
Lymphocytes Absolute: 2.1 10*3/uL (ref 0.7–3.1)
Lymphs: 40 %
MCH: 33.4 pg — ABNORMAL HIGH (ref 26.6–33.0)
MCHC: 35 g/dL (ref 31.5–35.7)
MCV: 95 fL (ref 79–97)
Monocytes Absolute: 0.7 10*3/uL (ref 0.1–0.9)
Monocytes: 13 %
Neutrophils Absolute: 2.5 10*3/uL (ref 1.4–7.0)
Neutrophils: 45 %
Platelets: 233 10*3/uL (ref 150–450)
RBC: 4.82 x10E6/uL (ref 3.77–5.28)
RDW: 12.7 % (ref 11.7–15.4)
WBC: 5.3 10*3/uL (ref 3.4–10.8)

## 2021-04-26 LAB — FSH/LH
FSH: 64.4 m[IU]/mL
LH: 43.6 m[IU]/mL

## 2021-04-26 LAB — HEMOGLOBIN A1C
Est. average glucose Bld gHb Est-mCnc: 114 mg/dL
Hgb A1c MFr Bld: 5.6 % (ref 4.8–5.6)

## 2021-04-26 LAB — TSH: TSH: 0.895 u[IU]/mL (ref 0.450–4.500)

## 2021-04-26 LAB — T4, FREE: Free T4: 1.57 ng/dL (ref 0.82–1.77)

## 2021-04-29 DIAGNOSIS — F321 Major depressive disorder, single episode, moderate: Secondary | ICD-10-CM | POA: Insufficient documentation

## 2021-04-30 ENCOUNTER — Telehealth: Payer: Self-pay

## 2021-04-30 NOTE — Telephone Encounter (Signed)
5 boxes of Ozempic received for the patient. Called and LVM letting patient know that they were ready to be picked up.  ?

## 2021-05-01 ENCOUNTER — Telehealth: Payer: Self-pay | Admitting: Gastroenterology

## 2021-05-01 NOTE — Telephone Encounter (Signed)
Patient left vm returning call to nurse for lab results. Requesting call back. ?

## 2021-05-02 ENCOUNTER — Ambulatory Visit (INDEPENDENT_AMBULATORY_CARE_PROVIDER_SITE_OTHER): Payer: Medicare Other

## 2021-05-02 DIAGNOSIS — Z8679 Personal history of other diseases of the circulatory system: Secondary | ICD-10-CM | POA: Diagnosis not present

## 2021-05-02 DIAGNOSIS — R0602 Shortness of breath: Secondary | ICD-10-CM

## 2021-05-02 DIAGNOSIS — R7303 Prediabetes: Secondary | ICD-10-CM | POA: Insufficient documentation

## 2021-05-02 LAB — ECHOCARDIOGRAM COMPLETE
AR max vel: 2.43 cm2
AV Area VTI: 2.33 cm2
AV Area mean vel: 2.39 cm2
AV Mean grad: 7.7 mmHg
AV Peak grad: 14.8 mmHg
Ao pk vel: 1.93 m/s
Area-P 1/2: 3.99 cm2
P 1/2 time: 1126 msec
S' Lateral: 3.7 cm
Single Plane A4C EF: 57.2 %

## 2021-05-02 NOTE — Telephone Encounter (Signed)
Medication picked up by the patient.  

## 2021-05-03 ENCOUNTER — Telehealth: Payer: Self-pay | Admitting: Nurse Practitioner

## 2021-05-03 NOTE — Telephone Encounter (Signed)
Patient is on metformin but she just got the Ozempic from patient assistance and she needs to know does she now stop the metformin? Please advise. [phone] ?

## 2021-05-03 NOTE — Telephone Encounter (Signed)
Please let the patient know that she can stop the metformin when she starts the ozempic, with the oxempic, she should take 0.'25mg'$  weekly for four weeks, then increase to 0.'5mg'$  weekly. Please let me know if she has other questions. Thanks so much.   -HB

## 2021-05-03 NOTE — Telephone Encounter (Signed)
Metformin was not prescribed by Heather. I also do not see where Ozempic was prescribed to the patient. To discuss medication adjustments, patient will need to schedule an appointment with Nira Conn or with the prescriber of the Metformin. AS, CMA ?

## 2021-05-04 NOTE — Telephone Encounter (Signed)
Called pt she is advised of her medication change and recommendation ?

## 2021-05-09 ENCOUNTER — Other Ambulatory Visit: Payer: Self-pay | Admitting: Internal Medicine

## 2021-05-09 ENCOUNTER — Encounter: Payer: Self-pay | Admitting: Nurse Practitioner

## 2021-05-09 ENCOUNTER — Other Ambulatory Visit: Payer: Self-pay | Admitting: *Deleted

## 2021-05-09 DIAGNOSIS — E669 Obesity, unspecified: Secondary | ICD-10-CM

## 2021-05-09 DIAGNOSIS — G629 Polyneuropathy, unspecified: Secondary | ICD-10-CM

## 2021-05-09 DIAGNOSIS — I10 Essential (primary) hypertension: Secondary | ICD-10-CM

## 2021-05-09 NOTE — Patient Outreach (Signed)
[facility]) Care Management ? ?05/09/2021 ? ?Andreana Orlan Leavens ?1970-04-02 ?[id number] ? ?Initial outreach for previously [facility] pt who has moved and has a new PCP. Left message and requested a return call. ? ?[name]. Akaash Vandewater, MSN, GNP-BC ?Gerontological Nurse Practitioner ?[facility] Care Management ?[phone] ? ? ?

## 2021-05-10 ENCOUNTER — Encounter: Payer: Self-pay | Admitting: *Deleted

## 2021-05-10 NOTE — Patient Outreach (Signed)
[facility]) Care Management Geriatric Nurse Practitioner Note   05/10/2021 Name:  Elizabeth Patrick MRN:  [id number] DOB:  1970-04-26  Summary: Transitioned from embedded care manager to [facility] Management team as pt has chosen new PCP.  Recommendations/Changes made from today's visit: Attend DM/Nutrition classes, Attend aquatic fitness class  Subjective: Elizabeth Patrick is an 51 y.o. year old female who is a primary patient of Carlean Jews, NP. The care management team was consulted for assistance with care management and/or care coordination needs.    Geriatric Nurse Practitioner completed Telephone Visit today.   Medications Reviewed Today     Reviewed by Almetta Lovely, NP (Nurse Practitioner) on 05/09/21 at 1139  Med List Status: <None>   Medication Order Taking? Sig Documenting Provider Last Dose Status Informant  amLODipine (NORVASC) 10 MG tablet [id number] Yes 1 tablet [provider] Taking Active   atorvastatin (LIPITOR) 10 MG tablet [id number] Yes Take 1 tablet (10 mg total) by mouth daily. Loura Pardon, MD Taking Active   Cholecalciferol (D3 2000 PO) [id number] Yes Take 2 tablets by mouth. [provider] Taking Active   Cranberry 1000 MG CAPS [id number] Yes  [provider] Taking Active   DULoxetine (CYMBALTA) 20 MG capsule [id number] Yes Take 1 capsule (20 mg total) by mouth daily. Carlean Jews, NP Taking Active   esomeprazole (NEXIUM) 40 MG capsule [id number] Yes Take 40 mg by mouth every other day. [provider] Taking Active Self  estradiol (ESTRACE) 0.1 MG/GM vaginal cream [id number] Yes Estrogen Cream Instruction Discard applicator Apply pea sized amount to tip of finger to urethra before bed. Wash hands well after application. Use Monday, Wednesday and Friday Sondra Come, MD Taking Active   fenofibrate (TRICOR) 145 MG tablet [id number] Yes TAKE 1 TABLET(145 MG) BY MOUTH DAILY Vigg, [name], MD  Taking Active   furosemide (LASIX) 20 MG tablet [id number] Yes Take 20 mg by mouth as needed. [provider] Taking Active   gabapentin (NEURONTIN) 800 MG tablet [id number] Yes Take 800 mg by mouth 3 (three) times daily. [provider] Taking Active   Lidocaine (BLUE-EMU PAIN RELIEF DRY EX) [id number] Yes Apply topically. [provider] Taking Active   losartan (COZAAR) 100 MG tablet [id number] Yes TAKE 1 TABLET(100 MG) BY MOUTH DAILY Vigg, [name], MD Taking Active   metoprolol succinate (TOPROL-XL) 25 MG 24 hr tablet [id number] Yes TAKE 1 TABLET(25 MG) BY MOUTH DAILY Vigg, [name], MD Taking Active   SUPER B COMPLEX/C PO [id number] Yes Take by mouth. [provider] Taking Active   tiZANidine (ZANAFLEX) 2 MG tablet [id number] Yes Take by mouth every 6 (six) hours as needed for muscle spasms. [provider] Taking Active   Tocilizumab (ACTEMRA IV) [id number] Yes Inject into the vein. [provider] Taking Active   triamcinolone ointment (KENALOG) 0.1 % [id number] Yes Apply 1 application topically 3 (three) times daily. Loura Pardon, MD Taking Active            SDOH:  (Social Determinants of Health) assessments and interventions performed:  SDOH Interventions    Flowsheet Row Most Recent Value  SDOH Interventions   Food Insecurity Interventions Intervention Not Indicated      Care Plan  Review of patient past medical history, allergies, medications, health status, including review of consultants reports, laboratory and other test data, was performed as part of comprehensive evaluation for care management services.   Care Plan : [facility] NP Plan of Care  Updates made by Almetta Lovely, NP since 05/10/2021 12:00 AM     Problem: RNCM: Development of plan of care for Chronic Disease Mangement and Care Coordination Needs(HTN, HLD, DM, Obesity, Recurrent UTI with incontinence, RA and OA) Resolved 05/10/2021  Priority: High  Onset Date: 11/08/2020      Long-Range Goal: RNCM: Effective management of plan of care for Chronic Disease Mangement and Care Coordination Needs(HTN, HLD, DM, Obesity, Recurrent UTI with incontinence, RA and OA) Completed 05/10/2021  Start Date: 11/08/2020  Expected End Date: 11/08/2021  This Visit's Progress: On track  Priority: High  Note:   05/10/21 New care plan developed with NP, pt has changed PCP and no longer involved with embedded care manager.  Current Barriers:  Knowledge Deficits related to plan of care for management of HTN, HLD, DMII, and Obesity, Recurrent UTI with urinary incontinence, RA, and OA  Chronic Disease Management support and education needs related to HTN, HLD, DMII, and Obesity, Recurrent UTI with urinary incontinence, RA, and OA   RNCM Clinical Goal(s):  Patient will verbalize understanding of plan for management of HTN, HLD, DMII, Osteoarthritis, and RA, Obesity, Recurrent UTI with urinary incontinence  verbalize basic understanding of HTN, HLD, DMII, Osteoarthritis, and RA, Obesity, Recurrent UTI with urinary incontinence disease process and self health management plan  take all medications exactly as prescribed and will call provider for medication related questions demonstrate understanding of rationale for each prescribed medication  attend all scheduled medical appointments: 06-04-2021 demonstrate improved and ongoing adherence to prescribed treatment plan for HTN, HLD, DMII, Osteoarthritis, and RA, Obesity, recurrent UTI's with urinary incontinence  as evidenced by daily monitoring and recording of CBG  adherence to ADA/ carb modified diet exercise 4/5 days/week adherence to prescribed medication regimen contacting provider for new or worsened symptoms or questions  demonstrate improved and ongoing health management independence  continue to work with Medical illustrator and/or Social Worker to address care management and care coordination needs related to HTN, HLD, DMII,  Osteoarthritis, and RA, Obesity, frequent UTI's with urinary incontinence   demonstrate a decrease in HTN, HLD, DMII, and Osteoarthritis exacerbations RA, obesity, and recurrent UTI's with urinary incontinence  demonstrate ongoing self health care management ability to effectively manage chronic conditions through collaboration with RN Care manager, provider, and care team.   Interventions: 1:1 collaboration with primary care provider regarding development and update of comprehensive plan of care as evidenced by provider attestation and co-signature Inter-disciplinary care team collaboration (see longitudinal plan of care) Evaluation of current treatment plan related to  self management and patient's adherence to plan as established by provider   SDOH Barriers (Status: Goal on track: YES.) Long Term Goal (>30 days) Patient interviewed and SDOH assessment performed        SDOH Interventions    Flowsheet Row Most Recent Value  SDOH Interventions   Food Insecurity Interventions Intervention Not Indicated  Financial Strain Interventions Intervention Not Indicated  Housing Interventions Intervention Not Indicated  Intimate Partner Violence Interventions Intervention Not Indicated  Physical Activity Interventions Intervention Not Indicated  Stress Interventions Intervention Not Indicated  Social Connections Interventions Other (Comment)  [moved her from [place], is happy to be in [place]. Great support system]  Transportation Interventions Intervention Not Indicated     Patient interviewed and appropriate assessments performed Provided patient with information about resources available in [facility] and care guides to help with any changes in SDOH Discussed plans with patient for ongoing care management follow up and provided patient with  direct contact information for care management team Advised patient to to call the office for changes in conditions, changes in SDOH, questions or  concerns    Diabetes:  (Status: New goal. Goal on track: YES.) Lab Results  Component Value Date   HGBA1C 5.4 01/12/2021  Assessed patient's understanding of A1c goal: <7% Provided education to patient about basic DM disease process; Reviewed medications with patient and discussed importance of medication adherence. 11-08-2020: The patient is taking Ozempic as prescribed. States she has had some nausea but not bad. Education and support given. 01-16-2021: Endorses compliance with medications. 03-27-2021: The patient is having issues with getting her Ozempic. She called the assistance program and they told her the paperwork was not correct and would need to be resubmitted. The patient states that they also have supply demand problems and it would be 3 months before she would be able to get the Ozempic once paperwork resubmitted. Will illicit the help of the pharm D. The patient was asking about Metformin as she called the nurse at [facility] and was told that she needed to talk to her pcp about additional medications to help. Collaboration with the pcp and the pharm D for recommendations for the patient.      Reviewed prescribed diet with patient heart healthy/ADA diet. 01-16-2021: The patient is interested in a dietician to help with meal planning and choices. Gave the patient the number to   [facility] at [place] [phone]. She has relocated and needs a facility closer to where she lives. The patient also agrees to receive the healthy meal planning information by mail. The patient will talk with pcp at visit tomorrow about referral to dietician. Education and support given about meals and eating habits; Counseled on importance of regular laboratory monitoring as prescribed;        Discussed plans with patient for ongoing care management follow up and provided patient with direct contact information for care management team;      Provided patient with written  educational materials related to hypo and hyperglycemia and importance of correct treatment. 03-27-2021: Denies any lows at this time;       Reviewed scheduled/upcoming provider appointments including: 06-04-2021         Advised patient, providing education and rationale, to check cbg as directed  and record. 03-27-2021: The patient is doing well with her blood sugars at this time but is having issues with getting the Ozempic. She has been in contact with the assistance program and her insurance. Education and support provided.        call provider for findings outside established parameters;       Review of patient status, including review of consultants reports, relevant laboratory and other test results, and medications completed;       Screening for signs and symptoms of depression related to chronic disease state;        Assessed social determinant of health barriers;         Recurrent UTI's with urinary incontinence   (Status: Goal on Track (progressing): YES.) Evaluation of current treatment plan related to  Recurrent UTI's with urinary incontience  ,  self-management and patient's adherence to plan as established by provider. 03-27-2021: The patient is seeing the specialist and working with specialist on a consistent basis. The patient denies any new concerns and is following recommendations by the provider Discussed plans with patient for ongoing care management follow up and provided patient with direct contact  information for care management team Advised patient to call the office for changes in urinary health, any sx or sx of recurrent infection, keep appointments; Provided education to patient re: education on taking showers other than baths, drinking cranberry juice if recommended by the provider, good hygiene and monitoring for changes in urinary health; Reviewed medications with patient and discussed compliance. The patient is taking an ABX for 3 months and following the recommendations of  the urolgogist ; Reviewed scheduled/upcoming provider appointments including 06-04-2021; Discussed plans with patient for ongoing care management follow up and provided patient with direct contact information for care management team;  Hyperlipidemia:  (Status: Goal on Track (progressing): YES.) Lab Results  Component Value Date   CHOL 151 03/05/2021   HDL 61 03/05/2021   LDLCALC 73 03/05/2021   TRIG 89 03/05/2021   CHOLHDL 2.5 03/05/2021     Medication review performed; medication list updated in electronic medical record.  Provider established cholesterol goals reviewed. 03-27-2021: Review of lab work and praised for levels trending down to normal. The patient is actively doing what she can to do better with management of her chronic conditions; Counseled on importance of regular laboratory monitoring as prescribed; Provided HLD educational materials. 01-16-2021- Sending in formation in mail to the patient on healthy eating. ; Reviewed role and benefits of statin for ASCVD risk reduction; Discussed strategies to manage statin-induced myalgias; Reviewed importance of limiting foods high in cholesterol; Reviewed exercise goals and target of 150 minutes per week. 01-16-2021: The patient is working out with her husband some. She is doing baby steps. She is feeling much better since losing weight. Wants to lose more. Will continue to monitor.   Hypertension: (Status: Goal on Track (progressing): YES.) Last practice recorded BP readings:  BP Readings from Last 3 Encounters:  03/05/21 116/70  03/05/21 104/68  02/01/21 (!) 149/82  Most recent eGFR/CrCl:  Lab Results  Component Value Date   EGFR 94 01/12/2021    No components found for: CRCL  Evaluation of current treatment plan related to hypertension self management and patient's adherence to plan as established by provider. 03-27-2021: The patient is having more normalized blood pressures. The patient is making positive changes in her health  and well being. Will continue to monitor ;   Provided education to patient re: stroke prevention, s/s of heart attack and stroke; Reviewed prescribed diet heart healthy/ADA diet  Reviewed medications with patient and discussed importance of compliance;  Counseled on the importance of exercise goals with target of 150 minutes per week Discussed plans with patient for ongoing care management follow up and provided patient with direct contact information for care management team; Advised patient, providing education and rationale, to monitor blood pressure daily and record, calling PCP for findings outside established parameters;  Reviewed scheduled/upcoming provider appointments including: 06-04-2021  Provided education on prescribed diet heart healthy/ADA. 03-27-2021: Review of heart healthy/ADA diet. Education material to be mailed to the patient.   Discussed complications of poorly controlled blood pressure such as heart disease, stroke, circulatory complications, vision complications, kidney impairment, sexual dysfunction;   Pain: RA and OA:  (Status: Goal on Track (progressing): YES.) Pain assessment performed. 01-16-2021: Patient rates her pain today at a 3 to 4. She states it is not bad today. States when she overdoes it is worse but she is pacing her activity. 03-27-2021: The patient is rating her pain today at a 4/5 today  Medications reviewed. 03-27-2021: Is compliant with medications Reviewed provider established plan for pain  management. 11-08-2020: The patient has gotten back on schedule with her monthly infusions for her RA. States she can tell a difference. Had injections in bilateral knees last week and was able to walk in the neighborhood with her daughter. She states since moving from [place] to [place] she is feeling better and wanting to take care of her health and well being. States she hurts in her joints all over and it varies.  She uses topical applications to help with pain as well. 01-16-2021: Is  doing well and denies any acute findings. Is happy with weight loss and taking ownership of her health. 03-27-2021: The patient is going to have an ultrasound on her left leg on 03-28-2021.  Discussed importance of adherence to all scheduled medical appointments. 06-04-2021, Has several upcoming appointments with specialist for testing and needs.  Counseled on the importance of reporting any/all new or changed pain symptoms or management strategies to pain management provider; Advised patient to report to care team affect of pain on daily activities; Discussed use of relaxation techniques and/or diversional activities to assist with pain reduction (distraction, imagery, relaxation, massage, acupressure, TENS, heat, and cold application; Reviewed with patient prescribed pharmacological and nonpharmacological pain relief strategies; Advised patient to discuss changes in level or intensity of pain with provider;   Weight Loss:  (Status: New goal. Goal on Track (progressing): YES.) Advised patient to discuss with primary care provider options regarding weight management;  Provided verbal and/or written education to patient re: provider recommended life style modifications. 01-16-2021: Mailing copy of healthy eating to the patient. Also the patient is going to call:  N[facility] at [place] [phone]. She will discuss with the pcp tomorrow about referral for dietician help for meal planning and nutrition ;  Screening for signs and symptoms of depression;  Offered to connect patient with psychology or social work support for counseling and supportive care;  Reviewed recommended dietary changes: avoid fad diets, make small/incremental dietary and exercise changes, eat at the table and avoid eating in front of the TV, plan management of cravings, monitor snacking and cravings in food diary;  Patient Goals/Self-Care Activities: Patient will self administer  medications as prescribed as evidenced by self report/primary caregiver report  Patient will attend all scheduled provider appointments as evidenced by clinician review of documented attendance to scheduled appointments and patient/caregiver report Patient will call pharmacy for medication refills as evidenced by patient report and review of pharmacy fill history as appropriate Patient will attend church or other social activities as evidenced by patient report Patient will continue to perform ADL's independently as evidenced by patient/caregiver report Patient will continue to perform IADL's independently as evidenced by patient/caregiver report Patient will call provider office for new concerns or questions as evidenced by review of documented incoming telephone call notes and patient report Patient will work with BSW to address care coordination needs and will continue to work with the clinical team to address health care and disease management related needs as evidenced by documented adherence to scheduled care management/care coordination appointments - schedule appointment with eye doctor - check feet daily for cuts, sores or redness - set goal weight - trim toenails straight across - drink 6 to 8 glasses of water each day - eat fish at least once per week - fill half of plate with vegetables - limit fast food meals to no more than 1 per week - manage portion size - prepare main meal at home 3 to 5  days each week - read food labels for fat, fiber, carbohydrates and portion size - reduce red meat to 2 to 3 times a week - set a realistic goal - switch to low-fat or skim milk - switch to sugar-free drinks - keep feet up while sitting - wash and dry feet carefully every day - wear comfortable, cotton socks - wear comfortable, well-fitting shoes - check blood pressure 3 times per week - choose a place to take my blood pressure (home, clinic or office, retail store) - write blood  pressure results in a log or diary - learn about high blood pressure - keep a blood pressure log - take blood pressure log to all doctor appointments - call doctor for signs and symptoms of high blood pressure - develop an action plan for high blood pressure - keep all doctor appointments - take medications for blood pressure exactly as prescribed - report new symptoms to your doctor - eat more whole grains, fruits and vegetables, lean meats and healthy fats - call for medicine refill 2 or 3 days before it runs out - take all medications exactly as prescribed - call doctor with any symptoms you believe are related to your medicine - call doctor when you experience any new symptoms - go to all doctor appointments as scheduled - adhere to prescribed diet: Heart healthy/ADA - develop an exercise routine      Problem: Newly diagnosed diabetes with educational needs   Priority: High  Onset Date: 05/10/2021     Goal: Patient will attend diabetes and nutrition classed over the next 30 days and discuss materials with NP on next call.   Start Date: 05/10/2021  Expected End Date: 06/13/2021  Priority: High  Note:   Initiating new care plan 05/10/21 Newly dx DM, just starting learning process. Not checking glucose levels yet. Initial HbgA1C on 04/16/21 was 5.6.  Current Barriers:  Knowledge Deficits related to plan of care for management of DMII   RNCM Clinical Goal(s):  Patient will verbalize basic understanding of DMII disease process and self health management plan as evidenced by conversation with NP. take all medications exactly as prescribed and will call provider for medication related questions as evidenced by pt report.    demonstrate understanding of rationale for each prescribed medication as evidenced by pt recall.    attend all scheduled medical appointments: at [facility] as evidenced by chart review and pt report        through collaboration with RN Care  manager, provider, and care team.   Interventions: Inter-disciplinary care team collaboration (see longitudinal plan of care) Evaluation of current treatment plan related to  self management and patient's adherence to plan as established by provider  Patient Goals/Self-Care Activities: Take medications as prescribed   Attend all scheduled provider appointments Call provider office for new concerns or questions       Problem: Morbid Obesity level 4, BMI >50   Priority: Medium  Onset Date: 05/10/2021     Long-Range Goal: Patient will lose 10# over the next 90 days per pt report. (current wt 322#)   Start Date: 05/10/2021  Expected End Date: 08/13/2021  Priority: Medium  Note:   New care plan established 05/10/21 Background Pt current wt is 332#, initial wt was 387# has lost 55#. End goal is 220# so that she can be considered for bilateral knee replacement. In addition she has been newly diagnosed with DMII.  Current Barriers:  Chronic Disease Management support and  education needs related to Morbid Obesity.   RNCM Clinical Goal(s):  Patient will verbalize understanding of plan for management of Morbid Obesity as evidenced by Pt report demonstrate Improved health management independence as evidenced by pt report of strategies to lose wt each month. continue to work with RN Care Manager to address care management and care coordination needs related to  Obesity as evidenced by adherence to CM Team Scheduled appointments through collaboration with RN Care manager, provider, and care team.   Interventions: Inter-disciplinary care team collaboration (see longitudinal plan of care) Evaluation of current treatment plan related to  self management and patient's adherence to plan as established by provider Provided pt with [facility] of local [facility], class descriptions and cost. Encouraged attending 2-3 times a week.  Patient Goals/Self-Care Activities: Take all medications as  prescribed Attend all scheduled provider appointments Perform all self care activities independently  Call provider office for new concerns or questions   Follow Up Plan:  Telephone follow up appointment with care management team member scheduled for:  06/06/21.       Plan: Telephone follow up appointment with care management team member scheduled for:  May 24th . Pt agrees to the plan of care.  Zara Council. Burgess Estelle, MSN, [facility] Gerontological Nurse Practitioner [facility] Care Management [phone]

## 2021-05-10 NOTE — Telephone Encounter (Signed)
Requested medications are due for refill today.  yes ? ?Requested medications are on the active medications list.  yes ? ?Last refill. 02/14/2021 #90 0 refills ? ?Future visit scheduled.   yes ? ?Notes to clinic.  PCP listed is Leretha Pol.  ? ? ? ?Requested Prescriptions  ?Pending Prescriptions Disp Refills  ? losartan (COZAAR) 100 MG tablet [Pharmacy Med Name: LOSARTAN '100MG'$  TABLETS] 90 tablet 0  ?  Sig: TAKE 1 TABLET(100 MG) BY MOUTH DAILY  ?  ? Cardiovascular:  Angiotensin Receptor Blockers Passed - 05/09/2021  5:46 AM  ?  ?  Passed - Cr in normal range and within 180 days  ?  Creatinine, Ser  ?Date Value Ref Range Status  ?04/16/2021 0.81 0.57 - 1.00 mg/dL Final  ?  ?  ?  ?  Passed - K in normal range and within 180 days  ?  Potassium  ?Date Value Ref Range Status  ?04/16/2021 4.0 3.5 - 5.2 mmol/L Final  ?  ?  ?  ?  Passed - Patient is not pregnant  ?  ?  Passed - Last BP in normal range  ?  BP Readings from Last 1 Encounters:  ?04/23/21 119/69  ?  ?  ?  ?  Passed - Valid encounter within last 6 months  ?  Recent Outpatient Visits   ? ?      ? 1 month ago Prediabetes  ? [name] Vigg, [name], MD  ? 2 months ago Rheumatoid arthritis, involving unspecified site, unspecified whether rheumatoid factor present ([place])  ? [facility] Vigg, [name], MD  ? 3 months ago Hernia of abdominal wall  ? [facility] Vigg, [name], MD  ? 5 months ago Need for influenza vaccination  ? [facility] Vigg, [name], MD  ? 6 months ago Dysuria  ? [name] Vigg, [name], MD  ? ?  ?  ?Future Appointments   ? ?        ? In 4 days Agbor-Etang, Aaron Edelman, MD [facility]  ? In 1 week Ronnell Freshwater, NP [facility] at [facility]  ? In 3 weeks Vigg, [name], MD [facility], [facility]  ? In 3 months [name], MD [place]  ? In 5 months Sheffield, [name], [facility], [facility]  ? ?  ? ? ?  ?  ?   ?  ?

## 2021-05-14 ENCOUNTER — Ambulatory Visit: Payer: Medicare Other | Admitting: Cardiology

## 2021-05-14 ENCOUNTER — Encounter: Payer: Self-pay | Admitting: *Deleted

## 2021-05-14 ENCOUNTER — Encounter: Payer: Self-pay | Admitting: Cardiology

## 2021-05-14 VITALS — BP 112/68 | HR 66 | Ht 64.0 in | Wt 335.0 lb

## 2021-05-14 DIAGNOSIS — I8393 Asymptomatic varicose veins of bilateral lower extremities: Secondary | ICD-10-CM

## 2021-05-14 DIAGNOSIS — Z8679 Personal history of other diseases of the circulatory system: Secondary | ICD-10-CM | POA: Diagnosis not present

## 2021-05-14 DIAGNOSIS — I10 Essential (primary) hypertension: Secondary | ICD-10-CM

## 2021-05-14 LAB — COPPER, URINE - RANDOM OR 24 HOUR
Copper / Creatinine Ratio: 14 ug/g creat (ref 0–49)
Copper, 24H Ur: 14 ug/24 hr (ref 3–35)
Copper, Ur: 12 ug/L
Creatinine(Crt),U: 0.88 g/L (ref 0.30–3.00)

## 2021-05-14 NOTE — Patient Instructions (Signed)

## 2021-05-14 NOTE — Progress Notes (Signed)
?Cardiology Office Note:   ? ?Date:  05/14/2021  ? ?ID:  Maury Dus, DOB [birth year], MRN [id number] ? ?PCP:  Ronnell Freshwater, NP ?  ?[facility] Providers ?Cardiologist:  None    ? ?Referring MD: Charlynne Cousins, MD  ? ?Chief Complaint  ?Patient presents with  ? OTher  ?  Follow up post testing. Meds reviewed verbally with patient.   ? ? ? ?History of Present Illness:   ? ?Elizabeth Patrick is a 51 y.o. female with a hx of hypertension, hyperlipidemia, diabetes, morbid obesity, edema who presents for follow-up.  Previously seen due to lower extremity edema. ? ?She states having a history of mitral valve prolapse, echocardiogram was obtained to evaluate any valvular abnormalities.  Has leg edema/lymphedema.  Has an appointment with [facility] lymphedema clinic.  States feeling well, she is exercising and eating healthier in order to lose weight.  States feeling better with exercising and losing some weight.  Denies edema, lower extremity ultrasound obtained to rule out DVT.  Presents for echo and ultrasound results.  No new concerns or complaints. ? ? ? ?Past Medical History:  ?Diagnosis Date  ? RA (rheumatoid arthritis) ([place])   ? ? ?Past Surgical History:  ?Procedure Laterality Date  ? ABDOMINAL HYSTERECTOMY    ? ? ?Current Medications: ?Current Meds  ?Medication Sig  ? amLODipine (NORVASC) 10 MG tablet 1 tablet  ? atorvastatin (LIPITOR) 10 MG tablet Take 1 tablet (10 mg total) by mouth daily.  ? Cholecalciferol (D3 2000 PO) Take 2 tablets by mouth.  ? Cranberry 1000 MG CAPS   ? DULoxetine (CYMBALTA) 20 MG capsule Take 1 capsule (20 mg total) by mouth daily.  ? esomeprazole (NEXIUM) 40 MG capsule Take 40 mg by mouth every other day.  ? estradiol (ESTRACE) 0.1 MG/GM vaginal cream Estrogen Cream Instruction Discard applicator Apply pea sized amount to tip of finger to urethra before bed. Wash hands well after application. Use Monday, Wednesday and Friday  ? fenofibrate (TRICOR) 145 MG tablet TAKE 1 TABLET(145 MG) BY  MOUTH DAILY  ? furosemide (LASIX) 20 MG tablet Take 20 mg by mouth as needed.  ? gabapentin (NEURONTIN) 800 MG tablet Take 800 mg by mouth 3 (three) times daily.  ? Lidocaine (BLUE-EMU PAIN RELIEF DRY EX) Apply topically.  ? losartan (COZAAR) 100 MG tablet TAKE 1 TABLET(100 MG) BY MOUTH DAILY  ? metoprolol succinate (TOPROL-XL) 25 MG 24 hr tablet TAKE 1 TABLET(25 MG) BY MOUTH DAILY  ? Semaglutide,0.25 or 0.'5MG'$ /DOS, (OZEMPIC, 0.25 OR 0.5 MG/DOSE,) 2 MG/1.5ML SOPN Inject into the skin once a week.  ? SUPER B COMPLEX/C PO Take by mouth.  ? tiZANidine (ZANAFLEX) 2 MG tablet Take by mouth every 6 (six) hours as needed for muscle spasms.  ? Tocilizumab (ACTEMRA IV) Inject into the vein.  ? triamcinolone ointment (KENALOG) 0.1 % Apply 1 application topically 3 (three) times daily.  ?  ? ?Allergies:   Alcohol and Dilantin [phenytoin]  ? ?Social History  ? ?Socioeconomic History  ? Marital status: Married  ?  Spouse name: Not on file  ? Number of children: Not on file  ? Years of education: Not on file  ? Highest education level: Not on file  ?Occupational History  ? Not on file  ?Tobacco Use  ? Smoking status: Former  ?  Packs/day: 1.00  ?  Years: 10.00  ?  Pack years: 10.00  ?  Types: Cigarettes  ?  Quit date: 01/14/2005  ?  Years  since quitting: 16.3  ? Smokeless tobacco: Never  ?Substance and Sexual Activity  ? Alcohol use: Not Currently  ? Drug use: Never  ? Sexual activity: Not Currently  ?  Partners: Male  ?  Birth control/protection: Surgical  ?Other Topics Concern  ? Not on file  ?Social History Narrative  ? Not on file  ? ?Social Determinants of Health  ? ?Financial Resource Strain: Low Risk   ? Difficulty of Paying Living Expenses: Not hard at all  ?Food Insecurity: No Food Insecurity  ? Worried About Charity fundraiser in the Last Year: Never true  ? Ran Out of Food in the Last Year: Never true  ?Transportation Needs: No Transportation Needs  ? Lack of Transportation (Medical): No  ? Lack of Transportation  (Non-Medical): No  ?Physical Activity: Sufficiently Active  ? Days of Exercise per Week: 5 days  ? Minutes of Exercise per Session: 50 min  ?Stress: No Stress Concern Present  ? Feeling of Stress : Only a little  ?Social Connections: Moderately Isolated  ? Frequency of Communication with Friends and Family: More than three times a week  ? Frequency of Social Gatherings with Friends and Family: More than three times a week  ? Attends Religious Services: Never  ? Active Member of Clubs or Organizations: No  ? Attends Archivist Meetings: Never  ? Marital Status: Married  ?  ? ?Family History: ?The patient's family history includes Bipolar disorder in her mother; Coronary artery disease in her mother; Heart attack in her father; Hypertension in her father; Multiple sclerosis in her mother; Obesity in her mother; Vascular Disease in her father. ? ?ROS:   ?Please see the history of present illness.    ? All other systems reviewed and are negative. ? ?EKGs/Labs/Other Studies Reviewed:   ? ?The following studies were reviewed today: ? ? ?EKG:  EKG not  ordered today.   ? ?Recent Labs: ?04/16/2021: ALT 55; BUN 15; Creatinine, Ser 0.81; Hemoglobin 16.1; Platelets 233; Potassium 4.0; Sodium 142; TSH 0.895  ?Recent Lipid Panel ?   ?Component Value Date/Time  ? CHOL 151 03/05/2021 1107  ? CHOL 165 12/12/2020 1436  ? TRIG 89 03/05/2021 1107  ? TRIG 101 12/12/2020 1436  ? HDL 61 03/05/2021 1107  ? CHOLHDL 2.5 03/05/2021 1107  ? [place] 73 03/05/2021 1107  ? ? ? ?Risk Assessment/Calculations:   ? ? ?    ? ?Physical Exam:   ? ?VS:  BP 112/68 (BP Location: Left Arm, Patient Position: Sitting, Cuff Size: Normal)   Pulse 66   Ht '5\' 4"'$  (1.626 m)   Wt (!) 335 lb (152 kg)   SpO2 97%   BMI 57.50 kg/m?    ? ?Wt Readings from Last 3 Encounters:  ?05/14/21 (!) 335 lb (152 kg)  ?04/23/21 (!) 339 lb 1.9 oz (153.8 kg)  ?04/17/21 (!) 340 lb 6.4 oz (154.4 kg)  ?  ? ?GEN:  Well nourished, well developed in no acute distress ?HEENT:  Normal ?NECK: No JVD; No carotid bruits ?CARDIAC: No murmurs, occasional skipped heartbeats, regular. ?RESPIRATORY:  Clear to auscultation without rales, wheezing or rhonchi  ?ABDOMEN: Soft, non-tender, non-distended ?MUSCULOSKELETAL: Lower extremities leg swelling consistent with lymphedema noted, varicose veins noted ?SKIN: Warm and dry ?NEUROLOGIC:  Alert and oriented x 3 ?PSYCHIATRIC:  Normal affect  ? ?ASSESSMENT:   ? ?1. Asymptomatic varicose veins of both lower extremities   ?2. H/O mitral valve prolapse   ?3. Morbid obesity ([place])   ?  4. Primary hypertension   ? ? ?PLAN:   ? ?In order of problems listed above: ? ?Varicose veins, no DVT, venous reflux noted pain right femoral vein.  Edema noted on exam.  Leg raising while in seated position advised.  Weight management will also be helpful.  Has appointment with lymphedema clinic. ?States having history of mitral valve prolapse, echo showed normal systolic function EF 60 to 65%, no evidence for mitral valve prolapse. ?Morbid obesity,.  Continue low calorie diet, weight loss. ?Hypertension, BP controlled.  Continue losartan, Toprol-XL, Norvasc. ? ?Follow-up yearly. ? ?   ? ?Medication Adjustments/Labs and Tests Ordered: ?Current medicines are reviewed at length with the patient today.  Concerns regarding medicines are outlined above.  ?No orders of the defined types were placed in this encounter. ? ?No orders of the defined types were placed in this encounter. ? ? ?Patient Instructions  ?Medication Instructions:  ?Your physician recommends that you continue on your current medications as directed. Please refer to the Current Medication list given to you today.  ?*If you need a refill on your cardiac medications before your next appointment, please call your pharmacy* ? ? ?Lab Work: ?None ordered ?If you have labs (blood work) drawn today and your tests are completely normal, you will receive your results only by: ?MyChart Message (if you have MyChart) OR ?A paper  copy in the mail ?If you have any lab test that is abnormal or we need to change your treatment, we will call you to review the results. ? ? ?Testing/Procedures: ?None ordered ? ? ?Follow-Up: ?At [facility], you

## 2021-05-16 DIAGNOSIS — M0609 Rheumatoid arthritis without rheumatoid factor, multiple sites: Secondary | ICD-10-CM | POA: Diagnosis not present

## 2021-05-18 ENCOUNTER — Other Ambulatory Visit: Payer: Self-pay | Admitting: Nurse Practitioner

## 2021-05-18 DIAGNOSIS — F321 Major depressive disorder, single episode, moderate: Secondary | ICD-10-CM

## 2021-05-20 ENCOUNTER — Other Ambulatory Visit: Payer: Self-pay | Admitting: Internal Medicine

## 2021-05-20 DIAGNOSIS — G629 Polyneuropathy, unspecified: Secondary | ICD-10-CM

## 2021-05-20 DIAGNOSIS — I10 Essential (primary) hypertension: Secondary | ICD-10-CM

## 2021-05-20 DIAGNOSIS — E669 Obesity, unspecified: Secondary | ICD-10-CM

## 2021-05-21 NOTE — Telephone Encounter (Signed)
Per chart- patient has moved and changed provider ?Requested Prescriptions  ?Pending Prescriptions Disp Refills  ?? metoprolol succinate (TOPROL-XL) 25 MG 24 hr tablet [Pharmacy Med Name: METOPROLOL ER SUCCINATE '25MG'$  TABS] 90 tablet   ?  Sig: TAKE 1 TABLET(25 MG) BY MOUTH DAILY  ?  ? Cardiovascular:  Beta Blockers Passed - 05/20/2021  9:42 AM  ?  ?  Passed - Last BP in normal range  ?  BP Readings from Last 1 Encounters:  ?05/14/21 112/68  ?   ?  ?  Passed - Last Heart Rate in normal range  ?  Pulse Readings from Last 1 Encounters:  ?05/14/21 66  ?   ?  ?  Passed - Valid encounter within last 6 months  ?  Recent Outpatient Visits   ?      ? 1 month ago Prediabetes  ? [name] Vigg, [name], MD  ? 2 months ago Rheumatoid arthritis, involving unspecified site, unspecified whether rheumatoid factor present ([place])  ? [place], MD  ? 4 months ago Hernia of abdominal wall  ? [facility] Vigg, [name], MD  ? 5 months ago Need for influenza vaccination  ? [facility] Vigg, [name], MD  ? 7 months ago Dysuria  ? [name] Vigg, [name], MD  ?  ?  ?Future Appointments   ?        ? Tomorrow Ronnell Freshwater, NP [place] [facility] at [facility]  ? In 2 weeks Vigg, [name], MD [facility]  ? In 2 months [name], MD [place]  ? In 5 months Warren Danes, [facility], [facility]  ?  ? ?  ?  ?  ? ?

## 2021-05-22 ENCOUNTER — Ambulatory Visit (INDEPENDENT_AMBULATORY_CARE_PROVIDER_SITE_OTHER): Payer: Medicare Other | Admitting: Nurse Practitioner

## 2021-05-22 ENCOUNTER — Other Ambulatory Visit (HOSPITAL_COMMUNITY)
Admission: RE | Admit: 2021-05-22 | Discharge: 2021-05-22 | Disposition: A | Discharge status: Home / Self Care | Payer: Medicare Other | Source: Ambulatory Visit | Attending: Nurse Practitioner | Admitting: Nurse Practitioner

## 2021-05-22 ENCOUNTER — Encounter: Payer: Self-pay | Admitting: Nurse Practitioner

## 2021-05-22 VITALS — BP 134/85 | HR 62 | Temp 97.2°F | Ht 64.17 in | Wt 335.0 lb

## 2021-05-22 DIAGNOSIS — R7303 Prediabetes: Secondary | ICD-10-CM

## 2021-05-22 DIAGNOSIS — I10 Essential (primary) hypertension: Secondary | ICD-10-CM

## 2021-05-22 DIAGNOSIS — L409 Psoriasis, unspecified: Secondary | ICD-10-CM

## 2021-05-22 DIAGNOSIS — N390 Urinary tract infection, site not specified: Secondary | ICD-10-CM

## 2021-05-22 DIAGNOSIS — R300 Dysuria: Secondary | ICD-10-CM

## 2021-05-22 DIAGNOSIS — Z113 Encounter for screening for infections with a predominantly sexual mode of transmission: Secondary | ICD-10-CM

## 2021-05-22 DIAGNOSIS — Z01419 Encounter for gynecological examination (general) (routine) without abnormal findings: Secondary | ICD-10-CM

## 2021-05-22 DIAGNOSIS — Z1151 Encounter for screening for human papillomavirus (HPV): Secondary | ICD-10-CM | POA: Insufficient documentation

## 2021-05-22 DIAGNOSIS — M069 Rheumatoid arthritis, unspecified: Secondary | ICD-10-CM

## 2021-05-22 DIAGNOSIS — R319 Hematuria, unspecified: Secondary | ICD-10-CM | POA: Diagnosis not present

## 2021-05-22 DIAGNOSIS — F321 Major depressive disorder, single episode, moderate: Secondary | ICD-10-CM | POA: Diagnosis not present

## 2021-05-22 LAB — POCT URINALYSIS DIP (CLINITEK)
Bilirubin, UA: NEGATIVE
Blood, UA: NEGATIVE
Glucose, UA: NEGATIVE mg/dL
Ketones, POC UA: NEGATIVE mg/dL
Nitrite, UA: NEGATIVE
Spec Grav, UA: >=1.03 — AB (ref 1.010–1.025)
Urobilinogen, UA: 0.2 E.U./dL
pH, UA: 5.5 (ref 5.0–8.0)

## 2021-05-22 LAB — POCT UA - MICROALBUMIN
Creatinine, POC: 300 mg/dL
Microalbumin Ur, POC: 80 mg/L

## 2021-05-22 MED ORDER — FLUOXETINE HCL 10 MG PO CAPS: 10.0000 mg | ORAL_CAPSULE | Freq: Every day | ORAL | 0 refills | Status: DC

## 2021-05-22 MED ORDER — CLOBETASOL PROPIONATE 0.05 % EX CREA: 1.0000 "application " | TOPICAL_CREAM | Freq: Two times a day (BID) | CUTANEOUS | 3 refills | Status: AC

## 2021-05-22 NOTE — Progress Notes (Signed)
Complete physical exam ? ? ?Patient: Elizabeth Patrick   DOB: March 04, 1970   51 y.o. Female  MRN: [id number] ?Visit Date: 05/22/2021 ? ? ? ?Chief Complaint  ?Patient presents with  ? Annual Exam  ? Gynecologic Exam  ? ?Subjective  ?  ?[name] is a 51 y.o. female who presents today for a complete physical exam.  ?She reports consuming a diabetic, 1500 calorie diet. The patient does not participate in regular exercise at present. She generally feels fairly well. She does have additional problems to discuss today.  ? ?HPI  ?The patient states that cymbalta side effects were negative. Did have trouble with achieving orgasm and also was unable to sleep while taking it. She has stopped. Yesterday was her last day on this. She states that it did help her mood. Was not crying as often. Did have a little mor energy during the day. Did have a little less pain.  ?-has been having increased headaches. States that she will have one every few days. Vision gets blurry and she is unable to read while she has a headache.  ?-psoriasis on the palms of her hands.  ?-frequency of urination. Has history of frequent UTI. Marland Kitchen ? ?Past Medical History:  ?Diagnosis Date  ? RA (rheumatoid arthritis) ([place])   ? ?Past Surgical History:  ?Procedure Laterality Date  ? ABDOMINAL HYSTERECTOMY    ? ?Social History  ? ?Socioeconomic History  ? Marital status: Married  ?  Spouse name: Not on file  ? Number of children: Not on file  ? Years of education: Not on file  ? Highest education level: Not on file  ?Occupational History  ? Not on file  ?Tobacco Use  ? Smoking status: Former  ?  Packs/day: 1.00  ?  Years: 10.00  ?  Pack years: 10.00  ?  Types: Cigarettes  ?  Quit date: 01/14/2005  ?  Years since quitting: 16.3  ? Smokeless tobacco: Never  ?Substance and Sexual Activity  ? Alcohol use: Not Currently  ? Drug use: Never  ? Sexual activity: Not Currently  ?  Partners: Male  ?  Birth control/protection: Surgical  ?Other Topics Concern  ? Not on file   ?Social History Narrative  ? Not on file  ? ?Social Determinants of Health  ? ?Financial Resource Strain: Low Risk   ? Difficulty of Paying Living Expenses: Not hard at all  ?Food Insecurity: No Food Insecurity  ? Worried About Charity fundraiser in the Last Year: Never true  ? Ran Out of Food in the Last Year: Never true  ?Transportation Needs: No Transportation Needs  ? Lack of Transportation (Medical): No  ? Lack of Transportation (Non-Medical): No  ?Physical Activity: Sufficiently Active  ? Days of Exercise per Week: 5 days  ? Minutes of Exercise per Session: 50 min  ?Stress: No Stress Concern Present  ? Feeling of Stress : Only a little  ?Social Connections: Moderately Isolated  ? Frequency of Communication with Friends and Family: More than three times a week  ? Frequency of Social Gatherings with Friends and Family: More than three times a week  ? Attends Religious Services: Never  ? Active Member of Clubs or Organizations: No  ? Attends Archivist Meetings: Never  ? Marital Status: Married  ?Intimate Partner Violence: Not At Risk  ? Fear of Current or Ex-Partner: No  ? Emotionally Abused: No  ? Physically Abused: No  ? Sexually Abused: No  ? ?  Family Status  ?Relation Name Status  ? Mother  Alive  ? Father  Deceased  ? ?Family History  ?Problem Relation Age of Onset  ? Obesity Mother   ? Coronary artery disease Mother   ? Bipolar disorder Mother   ? Multiple sclerosis Mother   ? Hypertension Father   ? Heart attack Father   ? Vascular Disease Father   ? ?Allergies  ?Allergen Reactions  ? Alcohol Anaphylaxis  ? Dilantin [Phenytoin] Anaphylaxis  ?  ?Patient Care Team: ?Ronnell Freshwater, NP as PCP - General (Family Medicine) ?Deloria Lair, NP as [place] Management  ? ?Medications: ?Outpatient Medications Prior to Visit  ?Medication Sig  ? atorvastatin (LIPITOR) 10 MG tablet Take 1 tablet (10 mg total) by mouth daily.  ? Cholecalciferol (D3 2000 PO) Take 2 tablets by mouth.   ? Cranberry 1000 MG CAPS   ? esomeprazole (NEXIUM) 40 MG capsule Take 40 mg by mouth every other day.  ? estradiol (ESTRACE) 0.1 MG/GM vaginal cream Estrogen Cream Instruction Discard applicator Apply pea sized amount to tip of finger to urethra before bed. Wash hands well after application. Use Monday, Wednesday and Friday  ? fenofibrate (TRICOR) 145 MG tablet TAKE 1 TABLET(145 MG) BY MOUTH DAILY  ? furosemide (LASIX) 20 MG tablet Take 20 mg by mouth as needed.  ? gabapentin (NEURONTIN) 800 MG tablet Take 800 mg by mouth 3 (three) times daily.  ? Lidocaine (BLUE-EMU PAIN RELIEF DRY EX) Apply topically.  ? losartan (COZAAR) 100 MG tablet TAKE 1 TABLET(100 MG) BY MOUTH DAILY  ? metoprolol succinate (TOPROL-XL) 25 MG 24 hr tablet TAKE 1 TABLET(25 MG) BY MOUTH DAILY  ? Semaglutide,0.25 or 0.5MG /DOS, (OZEMPIC, 0.25 OR 0.5 MG/DOSE,) 2 MG/1.5ML SOPN Inject into the skin once a week.  ? SUPER B COMPLEX/C PO Take by mouth.  ? tiZANidine (ZANAFLEX) 2 MG tablet Take by mouth every 6 (six) hours as needed for muscle spasms.  ? Tocilizumab (ACTEMRA IV) Inject into the vein.  ? triamcinolone ointment (KENALOG) 0.1 % Apply 1 application topically 3 (three) times daily.  ? [DISCONTINUED] amLODipine (NORVASC) 10 MG tablet 1 tablet  ? [DISCONTINUED] DULoxetine (CYMBALTA) 20 MG capsule Take 1 capsule (20 mg total) by mouth daily.  ? ?No facility-administered medications prior to visit.  ? ? ?Review of Systems  ?Constitutional:  Positive for fatigue. Negative for activity change, appetite change, chills and fever.  ?HENT:  Negative for congestion, postnasal drip, rhinorrhea, sinus pressure, sinus pain, sneezing and sore throat.   ?Eyes: Negative.   ?Respiratory:  Negative for cough, chest tightness, shortness of breath and wheezing.   ?Cardiovascular:  Negative for chest pain and palpitations.  ?Gastrointestinal:  Negative for abdominal pain, constipation, diarrhea, nausea and vomiting.  ?Endocrine: Negative for cold intolerance,  heat intolerance, polydipsia and polyuria.  ?     Blood sugars doing well    ?Genitourinary:  Positive for dysuria and frequency. Negative for dyspareunia, flank pain and urgency.  ?Musculoskeletal:  Positive for arthralgias. Negative for back pain and myalgias.  ?Skin:  Negative for rash.  ?Allergic/Immunologic: Negative for environmental allergies.  ?Neurological:  Negative for dizziness, weakness and headaches.  ?Hematological:  Negative for adenopathy.  ?Psychiatric/Behavioral:  Positive for dysphoric mood and sleep disturbance. The patient is nervous/anxious.   ? ?Last CBC ?Lab Results  ?Component Value Date  ? WBC 5.3 04/16/2021  ? HGB 16.1 (H) 04/16/2021  ? HCT 46.0 04/16/2021  ? MCV 95 04/16/2021  ? [place]  33.4 (H) 04/16/2021  ? RDW 12.7 04/16/2021  ? PLT 233 04/16/2021  ? ?Last metabolic panel ?Lab Results  ?Component Value Date  ? GLUCOSE 101 (H) 04/16/2021  ? NA 142 04/16/2021  ? K 4.0 04/16/2021  ? CL 101 04/16/2021  ? CO2 23 04/16/2021  ? BUN 15 04/16/2021  ? CREATININE 0.81 04/16/2021  ? EGFR 87 04/16/2021  ? CALCIUM 9.9 04/16/2021  ? PROT 6.9 04/16/2021  ? ALBUMIN 5.0 (H) 04/16/2021  ? LABGLOB 1.9 04/16/2021  ? AGRATIO 2.6 (H) 04/16/2021  ? BILITOT 0.6 04/16/2021  ? ALKPHOS 47 04/16/2021  ? AST 33 04/16/2021  ? ALT 55 (H) 04/16/2021  ? ?Last lipids ?Lab Results  ?Component Value Date  ? CHOL 151 03/05/2021  ? HDL 61 03/05/2021  ? [place] 73 03/05/2021  ? TRIG 89 03/05/2021  ? CHOLHDL 2.5 03/05/2021  ? ?Last hemoglobin A1c ?Lab Results  ?Component Value Date  ? HGBA1C 5.6 04/16/2021  ? ?Last thyroid functions ?Lab Results  ?Component Value Date  ? TSH 0.895 04/16/2021  ? ? ?  ? ? Objective  ?  ? ?Today's Vitals  ? 05/22/21 0939  ?BP: 134/85  ?Pulse: 62  ?Temp: (!) 97.2 ?F (36.2 ?C)  ?SpO2: 95%  ?Weight: (!) 335 lb (152 kg)  ?Height: 5' 4.17" (1.63 m)  ? ?Body mass index is 57.19 kg/m?.  ? ?BP Readings from Last 3 Encounters:  ?05/22/21 134/85  ?05/14/21 112/68  ?04/23/21 119/69  ?  ?Wt Readings from Last  3 Encounters:  ?05/22/21 (!) 335 lb (152 kg)  ?05/14/21 (!) 335 lb (152 kg)  ?04/23/21 (!) 339 lb 1.9 oz (153.8 kg)  ?  ? ?Physical Exam ?Vitals and nursing note reviewed. Exam conducted with a chaperone pr

## 2021-05-23 ENCOUNTER — Other Ambulatory Visit: Payer: Self-pay | Admitting: Internal Medicine

## 2021-05-23 ENCOUNTER — Telehealth: Payer: Medicare Other

## 2021-05-25 ENCOUNTER — Other Ambulatory Visit: Payer: Self-pay

## 2021-05-25 ENCOUNTER — Telehealth: Payer: Self-pay | Admitting: Nurse Practitioner

## 2021-05-25 LAB — URINE CULTURE

## 2021-05-25 MED ORDER — AMLODIPINE BESYLATE 10 MG PO TABS: ORAL_TABLET | ORAL | 1 refills | Status: DC

## 2021-05-25 NOTE — Telephone Encounter (Signed)
Patient is requesting a refill of Amlodipine and she states she has not heard anything back from the urine sample she left and her symptoms are worsening. Please advise.  ?

## 2021-05-26 LAB — GC/CHLAMYDIA PROBE AMP
Chlamydia trachomatis, NAA: NEGATIVE
Neisseria Gonorrhoeae by PCR: NEGATIVE

## 2021-05-26 LAB — TRICHOMONAS VAGINALIS, PROBE AMP: Trich vag by NAA: NEGATIVE

## 2021-05-27 ENCOUNTER — Telehealth: Payer: Self-pay | Admitting: Family Medicine

## 2021-05-27 DIAGNOSIS — L409 Psoriasis, unspecified: Secondary | ICD-10-CM | POA: Insufficient documentation

## 2021-05-27 MED ORDER — SULFAMETHOXAZOLE-TRIMETHOPRIM 800-160 MG PO TABS: 1.0000 | ORAL_TABLET | Freq: Two times a day (BID) | ORAL | 0 refills | Status: DC

## 2021-05-27 NOTE — Telephone Encounter (Signed)
Thank you :)

## 2021-05-27 NOTE — Progress Notes (Signed)
Patient started on Bactrim DS.

## 2021-05-27 NOTE — Telephone Encounter (Signed)
Basedon culture results, sent in Bactrim DS.   ? ?Meds ordered this encounter  ?Medications  ? sulfamethoxazole-trimethoprim (BACTRIM DS) 800-160 MG tablet  ?  Sig: Take 1 tablet by mouth 2 (two) times daily.  ?  Dispense:  6 tablet  ?  Refill:  0  ? ? ?

## 2021-05-29 ENCOUNTER — Ambulatory Visit: Payer: Medicare Other

## 2021-05-29 DIAGNOSIS — G4733 Obstructive sleep apnea (adult) (pediatric): Secondary | ICD-10-CM

## 2021-05-29 DIAGNOSIS — R0683 Snoring: Secondary | ICD-10-CM

## 2021-06-01 DIAGNOSIS — G4733 Obstructive sleep apnea (adult) (pediatric): Secondary | ICD-10-CM | POA: Diagnosis not present

## 2021-06-04 ENCOUNTER — Ambulatory Visit: Payer: Medicare Other | Admitting: Internal Medicine

## 2021-06-04 LAB — CYTOLOGY - PAP
Adequacy: ABSENT
Comment: NEGATIVE
Diagnosis: NEGATIVE
High risk HPV: NEGATIVE

## 2021-06-05 ENCOUNTER — Ambulatory Visit: Payer: Medicare Other | Admitting: Internal Medicine

## 2021-06-05 DIAGNOSIS — M0609 Rheumatoid arthritis without rheumatoid factor, multiple sites: Secondary | ICD-10-CM | POA: Diagnosis not present

## 2021-06-05 DIAGNOSIS — L403 Pustulosis palmaris et plantaris: Secondary | ICD-10-CM | POA: Diagnosis not present

## 2021-06-05 DIAGNOSIS — G894 Chronic pain syndrome: Secondary | ICD-10-CM | POA: Diagnosis not present

## 2021-06-05 DIAGNOSIS — M5136 Other intervertebral disc degeneration, lumbar region: Secondary | ICD-10-CM | POA: Diagnosis not present

## 2021-06-06 ENCOUNTER — Encounter: Payer: Self-pay | Admitting: Nurse Practitioner

## 2021-06-06 ENCOUNTER — Other Ambulatory Visit: Payer: Self-pay | Admitting: *Deleted

## 2021-06-06 NOTE — Patient Outreach (Signed)
[facility]) Care Management  06/06/2021  Elizabeth Patrick [birth year] [id number]  Called pt this am and this afternoon. No answer, left a message to return call for an update on her care plan progress.  [name]. [name], MSN, [facility] Gerontological Nurse Practitioner [facility] Care Management [phone]

## 2021-06-07 ENCOUNTER — Other Ambulatory Visit: Payer: Self-pay | Admitting: *Deleted

## 2021-06-07 NOTE — Patient Outreach (Signed)
[facility]) Care Management  06/07/2021  Elizabeth Patrick January 24, 1970 [id number]  Third outreach for continuation of [place], call went to voicemail, left message and again requested a return call. Advised will send unsuccessful letter and call again in 2 weeks if do not hear from pt.  [name]. [name], MSN, [facility] Gerontological Nurse Practitioner [facility] Care Management [phone]

## 2021-06-14 DIAGNOSIS — M47816 Spondylosis without myelopathy or radiculopathy, lumbar region: Secondary | ICD-10-CM | POA: Diagnosis not present

## 2021-06-14 DIAGNOSIS — M545 Low back pain, unspecified: Secondary | ICD-10-CM | POA: Diagnosis not present

## 2021-06-14 DIAGNOSIS — M0609 Rheumatoid arthritis without rheumatoid factor, multiple sites: Secondary | ICD-10-CM | POA: Diagnosis not present

## 2021-06-14 DIAGNOSIS — Z79891 Long term (current) use of opiate analgesic: Secondary | ICD-10-CM | POA: Diagnosis not present

## 2021-06-14 DIAGNOSIS — M170 Bilateral primary osteoarthritis of knee: Secondary | ICD-10-CM | POA: Diagnosis not present

## 2021-06-20 DIAGNOSIS — E119 Type 2 diabetes mellitus without complications: Secondary | ICD-10-CM | POA: Diagnosis not present

## 2021-06-20 LAB — HM DIABETES EYE EXAM

## 2021-06-21 ENCOUNTER — Other Ambulatory Visit: Payer: Self-pay | Admitting: Internal Medicine

## 2021-06-21 DIAGNOSIS — G629 Polyneuropathy, unspecified: Secondary | ICD-10-CM

## 2021-06-21 DIAGNOSIS — I10 Essential (primary) hypertension: Secondary | ICD-10-CM

## 2021-06-21 DIAGNOSIS — E669 Obesity, unspecified: Secondary | ICD-10-CM

## 2021-06-22 ENCOUNTER — Other Ambulatory Visit: Payer: Self-pay | Admitting: *Deleted

## 2021-06-22 DIAGNOSIS — M9901 Segmental and somatic dysfunction of cervical region: Secondary | ICD-10-CM | POA: Diagnosis not present

## 2021-06-22 DIAGNOSIS — M9903 Segmental and somatic dysfunction of lumbar region: Secondary | ICD-10-CM | POA: Diagnosis not present

## 2021-06-22 DIAGNOSIS — M5413 Radiculopathy, cervicothoracic region: Secondary | ICD-10-CM | POA: Diagnosis not present

## 2021-06-22 DIAGNOSIS — M5417 Radiculopathy, lumbosacral region: Secondary | ICD-10-CM | POA: Diagnosis not present

## 2021-06-22 NOTE — Patient Outreach (Signed)
Final outreach to continue [place] Management services. Have made 2 previous calls and left messages. Have sent an unsuccessful letter. Will close her case.  [name]. [name], MSN, [facility] Gerontological Nurse Practitioner [facility] Care Management [phone]

## 2021-06-24 ENCOUNTER — Other Ambulatory Visit: Payer: Self-pay | Admitting: Internal Medicine

## 2021-06-24 DIAGNOSIS — I10 Essential (primary) hypertension: Secondary | ICD-10-CM

## 2021-06-24 DIAGNOSIS — G629 Polyneuropathy, unspecified: Secondary | ICD-10-CM

## 2021-06-24 DIAGNOSIS — E669 Obesity, unspecified: Secondary | ICD-10-CM

## 2021-06-25 NOTE — Telephone Encounter (Signed)
Requested Prescriptions  Pending Prescriptions Disp Refills  . fenofibrate (TRICOR) 145 MG tablet [Pharmacy Med Name: FENOFIBRATE 145MG TABLETS] 90 tablet 0    Sig: TAKE 1 TABLET(145 MG) BY MOUTH DAILY     Cardiovascular:  Antilipid - Fibric Acid Derivatives Failed - 06/24/2021  9:42 AM      Failed - ALT in normal range and within 360 days    ALT  Date Value Ref Range Status  04/16/2021 55 (H) 0 - 32 IU/L Final   ALT (SGPT) P5P  Date Value Ref Range Status  12/12/2020 80 (H) 0 - 40 IU/L Final         Failed - HGB in normal range and within 360 days    Hemoglobin  Date Value Ref Range Status  04/16/2021 16.1 (H) 11.1 - 15.9 g/dL Final         Failed - Lipid Panel in normal range within the last 12 months    Cholesterol, Total  Date Value Ref Range Status  03/05/2021 151 100 - 199 mg/dL Final  12/12/2020 165 100 - 199 mg/dL Final   LDL Chol Calc (NIH)  Date Value Ref Range Status  03/05/2021 73 0 - 99 mg/dL Final   HDL  Date Value Ref Range Status  03/05/2021 61 >39 mg/dL Final   Triglycerides  Date Value Ref Range Status  03/05/2021 89 0 - 149 mg/dL Final  12/12/2020 101 0 - 149 mg/dL Final         Passed - AST in normal range and within 360 days    AST  Date Value Ref Range Status  04/16/2021 33 0 - 40 IU/L Final         Passed - Cr in normal range and within 360 days    Creatinine, Ser  Date Value Ref Range Status  04/16/2021 0.81 0.57 - 1.00 mg/dL Final   Creatinine, POC  Date Value Ref Range Status  05/22/2021 300 mg/dL Final         Passed - HCT in normal range and within 360 days    Hematocrit  Date Value Ref Range Status  04/16/2021 46.0 34.0 - 46.6 % Final         Passed - PLT in normal range and within 360 days    Platelets  Date Value Ref Range Status  04/16/2021 233 150 - 450 x10E3/uL Final         Passed - WBC in normal range and within 360 days    WBC  Date Value Ref Range Status  04/16/2021 5.3 3.4 - 10.8 x10E3/uL Final          Passed - eGFR is 30 or above and within 360 days    eGFR  Date Value Ref Range Status  04/16/2021 87 >59 mL/min/1.73 Final         Passed - Valid encounter within last 12 months    Recent Outpatient Visits          2 months ago Prediabetes   [name], [name], MD   3 months ago Rheumatoid arthritis, involving unspecified site, unspecified whether rheumatoid factor present ([name])   [name], [name], MD   5 months ago Hernia of abdominal wall   [name] Vigg, [name], MD   7 months ago Need for influenza vaccination   [name] Vigg, [name], MD   8 months ago Dysuria   [name] Vigg, [name], MD  Future Appointments            In 1 month [name], MD [place]   In 4 months [name], [name], [place] [facility], [place]

## 2021-06-26 ENCOUNTER — Encounter: Payer: Self-pay | Admitting: *Deleted

## 2021-06-27 ENCOUNTER — Ambulatory Visit (INDEPENDENT_AMBULATORY_CARE_PROVIDER_SITE_OTHER): Payer: Medicare Other | Admitting: Nurse Practitioner

## 2021-06-27 ENCOUNTER — Encounter: Payer: Self-pay | Admitting: Nurse Practitioner

## 2021-06-27 VITALS — BP 134/76 | HR 70 | Temp 97.1°F | Ht 64.17 in | Wt 338.0 lb

## 2021-06-27 DIAGNOSIS — I10 Essential (primary) hypertension: Secondary | ICD-10-CM

## 2021-06-27 DIAGNOSIS — E538 Deficiency of other specified B group vitamins: Secondary | ICD-10-CM

## 2021-06-27 DIAGNOSIS — N959 Unspecified menopausal and perimenopausal disorder: Secondary | ICD-10-CM

## 2021-06-27 DIAGNOSIS — E569 Vitamin deficiency, unspecified: Secondary | ICD-10-CM

## 2021-06-27 MED ORDER — CYANOCOBALAMIN 1000 MCG/ML IJ SOLN
1000.0000 ug | Freq: Once | INTRAMUSCULAR | Status: AC
  Administered 2021-06-27: 1000 ug via INTRAMUSCULAR

## 2021-06-27 MED ORDER — AMLODIPINE BESYLATE 10 MG PO TABS: ORAL_TABLET | ORAL | 1 refills | Status: DC

## 2021-06-27 NOTE — Progress Notes (Unsigned)
Established patient visit   Patient: Elizabeth Patrick   DOB: [birth year]   51 y.o. Female  MRN: [id number] Visit Date: 06/27/2021   Chief Complaint  Patient presents with   Follow-up   Subjective    HPI  ***   Medications: Outpatient Medications Prior to Visit  Medication Sig   amLODipine (NORVASC) 10 MG tablet 1 tablet   atorvastatin (LIPITOR) 10 MG tablet Take 1 tablet (10 mg total) by mouth daily.   Cholecalciferol (D3 2000 PO) Take 2 tablets by mouth.   clobetasol cream (TEMOVATE) 5.10 % Apply 1 application. topically 2 (two) times daily.   Cranberry 1000 MG CAPS    esomeprazole (NEXIUM) 40 MG capsule Take 40 mg by mouth every other day.   estradiol (ESTRACE) 0.1 MG/GM vaginal cream Estrogen Cream Instruction Discard applicator Apply pea sized amount to tip of finger to urethra before bed. Wash hands well after application. Use Monday, Wednesday and Friday   fenofibrate (TRICOR) 145 MG tablet TAKE 1 TABLET(145 MG) BY MOUTH DAILY   furosemide (LASIX) 20 MG tablet Take 20 mg by mouth as needed.   gabapentin (NEURONTIN) 800 MG tablet Take 800 mg by mouth 3 (three) times daily.   leflunomide (ARAVA) 20 MG tablet Take 20 mg by mouth daily.   Lidocaine (BLUE-EMU PAIN RELIEF DRY EX) Apply topically.   losartan (COZAAR) 100 MG tablet TAKE 1 TABLET(100 MG) BY MOUTH DAILY   metoprolol succinate (TOPROL-XL) 25 MG 24 hr tablet TAKE 1 TABLET(25 MG) BY MOUTH DAILY   Semaglutide,0.25 or 0.'5MG'$ /DOS, (OZEMPIC, 0.25 OR 0.5 MG/DOSE,) 2 MG/1.5ML SOPN Inject into the skin once a week.   SUPER B COMPLEX/C PO Take by mouth.   tiZANidine (ZANAFLEX) 2 MG tablet Take by mouth every 6 (six) hours as needed for muscle spasms.   Tocilizumab (ACTEMRA IV) Inject into the vein.   traMADol (ULTRAM) 50 MG tablet Take 50-100 mg by mouth 4 (four) times daily as needed.   triamcinolone ointment (KENALOG) 0.1 % Apply 1 application topically 3 (three) times daily.   [DISCONTINUED] FLUoxetine (PROZAC) 10 MG capsule  Take 1 capsule (10 mg total) by mouth daily.   [DISCONTINUED] sulfamethoxazole-trimethoprim (BACTRIM DS) 800-160 MG tablet Take 1 tablet by mouth 2 (two) times daily.   No facility-administered medications prior to visit.    Review of Systems  {Labs (Optional):23779}   Objective     Today's Vitals   06/27/21 1515  BP: 134/76  Pulse: 70  Temp: (!) 97.1 F (36.2 C)  SpO2: 97%  Weight: (!) 338 lb (153.3 kg)  Height: 5' 4.17" (1.63 m)   Body mass index is 57.71 kg/m.   BP Readings from Last 3 Encounters:  06/27/21 134/76  05/22/21 134/85  05/14/21 112/68    Wt Readings from Last 3 Encounters:  06/27/21 (!) 338 lb (153.3 kg)  05/22/21 (!) 335 lb (152 kg)  05/14/21 (!) 335 lb (152 kg)    Physical Exam  ***  No results found for any visits on 06/27/21.  Assessment & Plan     Problem List Items Addressed This Visit   None    No follow-ups on file.         Ronnell Freshwater, NP  [facility] [phone] (phone) [phone] (fax)  [place]

## 2021-06-28 ENCOUNTER — Encounter: Payer: Self-pay | Admitting: Nurse Practitioner

## 2021-07-03 ENCOUNTER — Encounter: Payer: Self-pay | Admitting: Dietician

## 2021-07-03 ENCOUNTER — Encounter: Payer: Medicare Other | Attending: Nurse Practitioner | Admitting: Dietician

## 2021-07-03 DIAGNOSIS — E119 Type 2 diabetes mellitus without complications: Secondary | ICD-10-CM | POA: Diagnosis not present

## 2021-07-03 DIAGNOSIS — Z794 Long term (current) use of insulin: Secondary | ICD-10-CM | POA: Insufficient documentation

## 2021-07-03 DIAGNOSIS — Z79891 Long term (current) use of opiate analgesic: Secondary | ICD-10-CM | POA: Diagnosis not present

## 2021-07-03 DIAGNOSIS — M47816 Spondylosis without myelopathy or radiculopathy, lumbar region: Secondary | ICD-10-CM | POA: Diagnosis not present

## 2021-07-03 DIAGNOSIS — M170 Bilateral primary osteoarthritis of knee: Secondary | ICD-10-CM | POA: Diagnosis not present

## 2021-07-03 NOTE — Progress Notes (Signed)
Patient was seen on 07/03/2021 for the first of a series of three diabetes self-management courses at [facility].  Patient Education Plan per assessed needs and concerns is to attend three course education program for Diabetes Self Management Education.  A1C was 5.6% 04/16/2021 decreased from 6.5% 09/26/2020.  The following learning objectives were met by the patient during this class: Describe diabetes, types of diabetes and pathophysiology State some common risk factors for diabetes Defines the role of glucose and insulin Describe the relationship between diabetes and cardiovascular and other risks State the members of the Healthcare Team States the rationale for glucose monitoring and when to test State their individual [place] the importance of logging glucose readings and how to interpret the readings Identifies A1C target Explain the correlation between A1c and eAG values State symptoms and treatment of high blood glucose and low blood glucose Explain proper technique for glucose testing and identify proper sharps disposal  Handouts given during class include: How to Thrive:  A Guide for Your Journey with Diabetes by the ADA Meal Plan Card and carbohydrate content list Dietary intake form Low Sodium Flavoring Tips Types of Fats Dining Out Label reading Snack list The diabetes portion plate Diabetes Resources A1c to eAG Conversion Chart Blood Glucose Log Diabetes Recommended Care Schedule Support Group Diabetes Success Plan Core Class Satisfaction Survey   Follow-Up Plan: Attend core 2

## 2021-07-04 ENCOUNTER — Telehealth: Payer: Self-pay | Admitting: Primary Care

## 2021-07-04 DIAGNOSIS — G4730 Sleep apnea, unspecified: Secondary | ICD-10-CM | POA: Insufficient documentation

## 2021-07-04 NOTE — Telephone Encounter (Signed)
Spoke to patient and scheduled mychart visit 07/05/21 at 9:00 to discuss results.  Nothing further needed.

## 2021-07-05 ENCOUNTER — Encounter: Payer: Self-pay | Admitting: Primary Care

## 2021-07-05 ENCOUNTER — Telehealth (INDEPENDENT_AMBULATORY_CARE_PROVIDER_SITE_OTHER): Payer: Medicare Other | Admitting: Primary Care

## 2021-07-05 DIAGNOSIS — G4730 Sleep apnea, unspecified: Secondary | ICD-10-CM | POA: Diagnosis not present

## 2021-07-05 NOTE — Progress Notes (Signed)
Virtual Visit via Video Note  I connected with Elizabeth Patrick on 08/13/21 at  9:00 AM EDT by a video enabled telemedicine application and verified that I am speaking with the correct person using two identifiers.  Location: Patient: Home Provider: Office   I discussed the limitations of evaluation and management by telemedicine and the availability of in person appointments. The patient expressed understanding and agreed to proceed.  History of Present Illness: 51 year old female, former smoker. PMH significant for HTN, type 2 diabetes, neuropathy, hyperlipidemia.   Previous [facility] pulmonary encounter:  04/17/2021 Patient presents today for sleep consult. She has symptoms of loud snoring, restless sleep, witnessed apnea and excessive daytime sleepiness. She wakes up every 2-4 hours to change position or use the restroom. She is unsure if she is waking up d/t snoring or pain from RA. Typical bedtime is between 8:30-10pm. It does not take her long to fall asleep. She wakes up on average 3-5 times a night. She starts her day at 4:45-5am. She falls asleep easily during the day after meals or sitting on the couch. She has had these symptoms for several years. She has tried sleeping with wedge pillow. Her father had sleep apnea. She is very unsure if she would be able to use CPAP. She has lost 55 lbs since September 2022. Her goal weight 230lbs, BMI < 40. She is hoping to have double knee replacements. She was supposed to have an echocardiogram today but needs to rescheduled. No previous sleep study. Epworth 14.  07/05/2021 Patient contacted today to review sleep study results. Home sleep study on 05/29/2021 showed severe obstructive sleep apnea, AHI 78/hour. We discussed treatment options including weight loss, oral appliance, CPAP therapy or referral to ENT for possible surgical options.  Due to severity of sleep apnea recommending patient be started on CPAP, she will need in lab CPAP titration study    Observations/Objective:  - Appears well; No acute respiratory complaints  Assessment and Plan:  Severe OSA  - Patient has symptoms of loud snoring, restless sleep, witnessed apnea and excessive daytime sleepiness. Epworth score 14. BMI 57. HST 05/29/21 showed severe OSA, AHI 78/hour. We reviewed treatment options, due to severity of sleep apnea recommend patient be started on CPAP. She will need CPAP titration to determine coreect pressure setting.   Follow Up Instructions:  FU after CPAP start 31-90 days    I discussed the assessment and treatment plan with the patient. The patient was provided an opportunity to ask questions and all were answered. The patient agreed with the plan and demonstrated an understanding of the instructions.   The patient was advised to call back or seek an in-person evaluation if the symptoms worsen or if the condition fails to improve as anticipated.  I provided 22 minutes of non-face-to-face time during this encounter.   Martyn Ehrich, NP

## 2021-07-10 ENCOUNTER — Encounter: Payer: Self-pay | Admitting: Dietician

## 2021-07-10 ENCOUNTER — Encounter: Payer: Medicare Other | Attending: Nurse Practitioner | Admitting: Dietician

## 2021-07-10 DIAGNOSIS — E119 Type 2 diabetes mellitus without complications: Secondary | ICD-10-CM | POA: Diagnosis not present

## 2021-07-10 DIAGNOSIS — Z794 Long term (current) use of insulin: Secondary | ICD-10-CM | POA: Diagnosis not present

## 2021-07-11 ENCOUNTER — Encounter: Payer: Self-pay | Admitting: Obstetrics and Gynecology

## 2021-07-11 ENCOUNTER — Ambulatory Visit (INDEPENDENT_AMBULATORY_CARE_PROVIDER_SITE_OTHER): Payer: Medicare Other | Admitting: Obstetrics and Gynecology

## 2021-07-11 VITALS — BP 132/82 | HR 66 | Ht 64.0 in | Wt 335.0 lb

## 2021-07-11 DIAGNOSIS — E7849 Other hyperlipidemia: Secondary | ICD-10-CM | POA: Diagnosis not present

## 2021-07-11 DIAGNOSIS — Z794 Long term (current) use of insulin: Secondary | ICD-10-CM

## 2021-07-11 DIAGNOSIS — E119 Type 2 diabetes mellitus without complications: Secondary | ICD-10-CM | POA: Diagnosis not present

## 2021-07-11 DIAGNOSIS — N951 Menopausal and female climacteric states: Secondary | ICD-10-CM | POA: Diagnosis not present

## 2021-07-11 DIAGNOSIS — I10 Essential (primary) hypertension: Secondary | ICD-10-CM

## 2021-07-11 MED ORDER — ESTRADIOL 0.025 MG/24HR TD PTTW: 1.0000 | MEDICATED_PATCH | TRANSDERMAL | 1 refills | Status: DC

## 2021-07-11 NOTE — Patient Instructions (Addendum)
Try uberlube for vaginal lubrication.   Menopause and Hormone Replacement Therapy Menopause is a normal time of life when menstrual periods stop completely and the ovaries stop producing the female hormones estrogen and progesterone. Low levels of these hormones can affect your health and cause symptoms. Hormone replacement therapy (HRT) can relieve some of those symptoms. HRT is the use of artificial (synthetic) hormones to replace hormones that your body has stopped producing because you have reached menopause. Types of HRT  HRT may consist of the synthetic hormones estrogen and progestin, or it may consist of estrogen-only therapy. You and your health care provider will decide which form of HRT is best for you. If you choose to be on HRT and you have a uterus, estrogen and progestin are usually prescribed. Estrogen-only therapy is used for women who do not have a uterus. Possible options for taking HRT include: Pills. Patches. Gels. Sprays. Vaginal cream. Vaginal rings. Vaginal inserts. The amount of hormones that you take and how long you take them varies according to your health. It is important to: Begin HRT with the lowest possible dosage. Stop HRT as soon as your health care provider tells you to stop. Work with your health care provider so that you feel informed and comfortable with your decisions. Tell a health care provider about: Any allergies you have. Whether you have had blood clots or know of any risk factors you may have for blood clots. Whether you or family members have had cancer, especially cancer of the breasts, ovaries, or uterus. Any surgeries you have had. All medicines you are taking, including vitamins, herbs, eye drops, creams, and over-the-counter medicines. Whether you are pregnant or may be pregnant. Any medical conditions you have. What are the benefits? HRT can reduce the frequency and severity of menopausal symptoms. Benefits of HRT vary according to  the kind of symptoms that you have, how severe they are, and your overall health. HRT may help to improve the following symptoms of menopause: Hot flashes and night sweats. These are sudden feelings of heat that spread over the face and body. The skin may turn red, like a blush. Night sweats are hot flashes that happen while you are sleeping or trying to sleep. Bone loss (osteoporosis). The body loses calcium more quickly after menopause, causing the bones to become weaker. This can increase the risk for bone breaks (fractures). Vaginal dryness. The lining of the vagina can become thin and dry, which can cause pain during sex or cause infection, burning, or itching. Urinary tract infections. Urinary incontinence. This is the inability to control when you urinate. Irritability. Short-term memory problems. What are the risks? Risks of HRT vary depending on your individual health and medical history. Risks of HRT also depend on whether you receive both estrogen and progestin or you receive estrogen only. HRT may increase the risk of: Spotting. This is when a small amount of blood leaks from the vagina unexpectedly. Endometrial cancer. This cancer is in the lining of the uterus (endometrium). Breast cancer. Increased density of breast tissue. This can make it harder to find breast cancer on a breast X-ray (mammogram). Stroke. Heart disease. Blood clots. Gallbladder disease or liver disease. Risks of HRT can increase if you have any of the following conditions: Endometrial cancer. Liver disease. Heart disease. Breast cancer. History of blood clots. History of stroke. Follow these instructions at home: Pap tests Have Pap tests done as often as told by your health care provider. A Pap test is  sometimes called a Pap smear. It is a screening test that is used to check for signs of cancer of the cervix and vagina. A Pap test can also identify the presence of infection or precancerous changes. Pap  tests may be done: Every 3 years, starting at age 50. Every 5 years, starting after age 6, in combination with testing for human papillomavirus (HPV). More often or less often depending on other medical conditions you have, your age, and other risk factors. It is up to you to get the results of your Pap test. Ask your health care provider, or the department that is doing the test, when your results will be ready. General instructions Take over-the-counter and prescription medicines only as told by your health care provider. Do not use any products that contain nicotine or tobacco. These products include cigarettes, chewing tobacco, and vaping devices, such as e-cigarettes. If you need help quitting, ask your health care provider. Get mammograms, pelvic exams, and medical checkups as often as told by your health care provider. Keep all follow-up visits. This is important. Contact a health care provider if you have: Pain or swelling in your legs. Lumps or changes in your breasts or armpits. Pain, burning, or bleeding when you urinate. Unusual vaginal bleeding. Dizziness or headaches. Pain in your abdomen. Get help right away if you have: Shortness of breath. Chest pain. Slurred speech. Weakness or numbness in any part of your arms or legs. These symptoms may represent a serious problem that is an emergency. Do not wait to see if the symptoms will go away. Get medical help right away. Call your local emergency services (911 in the [place] Do not drive yourself to the hospital. Summary Menopause is a normal time of life when menstrual periods stop completely and the ovaries stop producing the female hormones estrogen and progesterone. HRT can reduce the frequency and severity of menopausal symptoms. Risks of HRT vary depending on your individual health and medical history. This information is not intended to replace advice given to you by your health care provider. Make sure you discuss any  questions you have with your health care provider. Document Revised: 07/05/2019 Document Reviewed: 07/05/2019 Elsevier Patient Education  [place].

## 2021-07-11 NOTE — Progress Notes (Signed)
51 y.o. No obstetric history on file. Married White or Caucasian Not Hispanic or Latino female here for HRT options. She is having mood swings and hot flashes. [place] from 4/23 was 64. Her hot flashes during the day occur 2-3 x an hour, mild. She is up at least 2 x a night sweats, drenched.   She is on Gabapentin 800 mg 2-3 x a day. On it for neuropathy.   H/O a supracervical hysterectomy. She used to have occasional spotting when she voided. Had evaluation, was told it was c/w infection. Has incontinence. No blood seen on pelvic exam.   She is on estrace vaginal cream for urinary urgency, started 6-7 months ago. She continues to have vaginal dryness.   H/O DM, last HgbA1C was 5.6.  Tried Cymbalta for pain management, not depression. She cries easily, feels it is because of her hormones.   She has lost 60 lbs since October. Goal is to loose another 100 lbs in a year. Needs to have a BMI under 40 to have bilateral knee replacement. She is swimming 3-4 days a week.      No LMP recorded. Patient has had a hysterectomy.          Sexually active: Yes.    The current method of family planning is status post hysterectomy.    Exercising: No.  The patient does not participate in regular exercise at present. Smoker:  no  Health Maintenance: Pap:  05/22/21 WNL Hr HPV Neg  History of abnormal Pap:  no MMG:  02/22/21 Bi-rads 1 neg  BMD:   none  Colonoscopy: none colagaurd normal  TDaP:  utd  Gardasil: n/a   reports that she quit smoking about 16 years ago. Her smoking use included cigarettes. She has a 10.00 pack-year smoking history. She has never used smokeless tobacco. She reports that she does not currently use alcohol. She reports that she does not use drugs. Disabled with her RA. 2 adult daughters, 64 year old granddaughter. 29 year old daughter has high functioning autism, and lives with her, working.   Past Medical History:  Diagnosis Date   Abnormal uterine bleeding    Anemia    Blood  transfusion without reported diagnosis    Diabetes mellitus without complication (HCC)    Dysmenorrhea    Endometriosis    Fibroid    Genital warts    RA (rheumatoid arthritis) (HCC)   HTN, elevated lipids  Past Surgical History:  Procedure Laterality Date   ABDOMINAL HYSTERECTOMY     PARATHYROIDECTOMY      Current Outpatient Medications  Medication Sig Dispense Refill   amLODipine (NORVASC) 10 MG tablet Take 1/2 tablet po QD 30 tablet 1   atorvastatin (LIPITOR) 10 MG tablet Take 1 tablet (10 mg total) by mouth daily. 90 tablet 3   Cholecalciferol (D3 2000 PO) Take 2 tablets by mouth.     clobetasol cream (TEMOVATE) 1.28 % Apply 1 application. topically 2 (two) times daily. 60 g 3   Cranberry 1000 MG CAPS      esomeprazole (NEXIUM) 40 MG capsule Take 40 mg by mouth every other day.     estradiol (ESTRACE) 0.1 MG/GM vaginal cream Estrogen Cream Instruction Discard applicator Apply pea sized amount to tip of finger to urethra before bed. Wash hands well after application. Use Monday, Wednesday and Friday 42.5 g 11   fenofibrate (TRICOR) 145 MG tablet TAKE 1 TABLET(145 MG) BY MOUTH DAILY 90 tablet 0   furosemide (LASIX) 20 MG  tablet Take 20 mg by mouth as needed.     gabapentin (NEURONTIN) 800 MG tablet Take 800 mg by mouth 3 (three) times daily.     leflunomide (ARAVA) 20 MG tablet Take 20 mg by mouth daily.     Lidocaine (BLUE-EMU PAIN RELIEF DRY EX) Apply topically.     losartan (COZAAR) 100 MG tablet TAKE 1 TABLET(100 MG) BY MOUTH DAILY 90 tablet 1   metoprolol succinate (TOPROL-XL) 25 MG 24 hr tablet TAKE 1 TABLET(25 MG) BY MOUTH DAILY 90 tablet 0   Semaglutide,0.25 or 0.'5MG'$ /DOS, (OZEMPIC, 0.25 OR 0.5 MG/DOSE,) 2 MG/1.5ML SOPN Inject into the skin once a week.     SUPER B COMPLEX/C PO Take by mouth.     tiZANidine (ZANAFLEX) 2 MG tablet Take by mouth every 6 (six) hours as needed for muscle spasms.     Tocilizumab (ACTEMRA IV) Inject into the vein.     traMADol (ULTRAM) 50 MG  tablet Take 50-100 mg by mouth 4 (four) times daily as needed.     triamcinolone ointment (KENALOG) 0.1 % Apply 1 application topically 3 (three) times daily. 30 g 2   No current facility-administered medications for this visit.    Family History  Problem Relation Age of Onset   Obesity Mother    Coronary artery disease Mother    Bipolar disorder Mother    Multiple sclerosis Mother    Hypertension Father    Heart attack Father    Vascular Disease Father    Breast cancer Maternal Grandmother 36    Review of Systems  All other systems reviewed and are negative.   Exam:   BP 132/82   Pulse 66   Ht '5\' 4"'$  (1.626 m)   Wt (!) 335 lb (152 kg)   SpO2 99%   BMI 57.50 kg/m   Weight change: '@WEIGHTCHANGE'$ @ Height:   Height: '5\' 4"'$  (162.6 cm)  Ht Readings from Last 3 Encounters:  07/11/21 '5\' 4"'$  (1.626 m)  07/03/21 '5\' 4"'$  (1.626 m)  06/27/21 5' 4.17" (1.63 m)    General appearance: alert, cooperative and appears stated age  89. Vasomotor symptoms due to menopause -Discussed avoiding triggers, behavioral changes and herbal products -She is on gabapentin 800 mg 2-3 x a day for neuropathy.  -I calculate her 10 year risk of heart disease at 9.22%, discussed that ERT isn't recommended if her risk is over 10%. Discussed risks of blood clot, stroke, MI and breast cancer. She is actively loosing weight, correction of her HTN, and diabetes would decrease her risk of heart disease.  -We discussed that transdermal estrogen is better for her than oral estrogen - estradiol (VIVELLE-DOT) 0.025 MG/24HR; Place 1 patch onto the skin 2 (two) times a week.  Dispense: 8 patch; Refill: 1 -She has had a [place], doesn't need progesterone -F/U in one month -If she can't stay on ERT, another option is Celexa  2. Primary hypertension Controlled on medication  3. Other hyperlipidemia Controlled on medication  4. Type 2 diabetes mellitus without complication, with long-term current use of insulin  (HCC) Controlled on medication Actively loosing weight  CC: Leretha Pol, NP; Kate Sable, MD  ~40 minutes spent in total patient care.

## 2021-07-12 DIAGNOSIS — M0609 Rheumatoid arthritis without rheumatoid factor, multiple sites: Secondary | ICD-10-CM | POA: Diagnosis not present

## 2021-07-18 DIAGNOSIS — M170 Bilateral primary osteoarthritis of knee: Secondary | ICD-10-CM | POA: Diagnosis not present

## 2021-07-18 DIAGNOSIS — Z79891 Long term (current) use of opiate analgesic: Secondary | ICD-10-CM | POA: Diagnosis not present

## 2021-07-18 DIAGNOSIS — E1142 Type 2 diabetes mellitus with diabetic polyneuropathy: Secondary | ICD-10-CM | POA: Diagnosis not present

## 2021-07-18 DIAGNOSIS — M47816 Spondylosis without myelopathy or radiculopathy, lumbar region: Secondary | ICD-10-CM | POA: Diagnosis not present

## 2021-07-22 NOTE — Progress Notes (Unsigned)
Established patient visit   Patient: Elizabeth Patrick   DOB: 1970-03-30   51 y.o. Female  MRN: [id number] Visit Date: 07/23/2021   No chief complaint on file.  Subjective    HPI  Follow up visit.  -reduced amlodipine to 5 mg daily due to leg swelling.  -b12 injection may be needed    Medications: Outpatient Medications Prior to Visit  Medication Sig   amLODipine (NORVASC) 10 MG tablet Take 1/2 tablet po QD   atorvastatin (LIPITOR) 10 MG tablet Take 1 tablet (10 mg total) by mouth daily.   Cholecalciferol (D3 2000 PO) Take 2 tablets by mouth.   clobetasol cream (TEMOVATE) 3.29 % Apply 1 application. topically 2 (two) times daily.   Cranberry 1000 MG CAPS    esomeprazole (NEXIUM) 40 MG capsule Take 40 mg by mouth every other day.   estradiol (ESTRACE) 0.1 MG/GM vaginal cream Estrogen Cream Instruction Discard applicator Apply pea sized amount to tip of finger to urethra before bed. Wash hands well after application. Use Monday, Wednesday and Friday   estradiol (VIVELLE-DOT) 0.025 MG/24HR Place 1 patch onto the skin 2 (two) times a week.   fenofibrate (TRICOR) 145 MG tablet TAKE 1 TABLET(145 MG) BY MOUTH DAILY   furosemide (LASIX) 20 MG tablet Take 20 mg by mouth as needed.   gabapentin (NEURONTIN) 800 MG tablet Take 800 mg by mouth 3 (three) times daily.   leflunomide (ARAVA) 20 MG tablet Take 20 mg by mouth daily.   Lidocaine (BLUE-EMU PAIN RELIEF DRY EX) Apply topically.   losartan (COZAAR) 100 MG tablet TAKE 1 TABLET(100 MG) BY MOUTH DAILY   metoprolol succinate (TOPROL-XL) 25 MG 24 hr tablet TAKE 1 TABLET(25 MG) BY MOUTH DAILY   Semaglutide,0.25 or 0.'5MG'$ /DOS, (OZEMPIC, 0.25 OR 0.5 MG/DOSE,) 2 MG/1.5ML SOPN Inject into the skin once a week.   SUPER B COMPLEX/C PO Take by mouth.   tiZANidine (ZANAFLEX) 2 MG tablet Take by mouth every 6 (six) hours as needed for muscle spasms.   Tocilizumab (ACTEMRA IV) Inject into the vein.   traMADol (ULTRAM) 50 MG tablet Take 50-100 mg by mouth  4 (four) times daily as needed.   triamcinolone ointment (KENALOG) 0.1 % Apply 1 application topically 3 (three) times daily.   No facility-administered medications prior to visit.    Review of Systems  {Labs (Optional):23779}   Objective    There were no vitals taken for this visit. BP Readings from Last 3 Encounters:  07/11/21 132/82  06/27/21 134/76  05/22/21 134/85    Wt Readings from Last 3 Encounters:  07/11/21 (!) 335 lb (152 kg)  07/03/21 (!) 338 lb (153.3 kg)  06/27/21 (!) 338 lb (153.3 kg)    Physical Exam  ***  No results found for any visits on 07/23/21.  Assessment & Plan     Problem List Items Addressed This Visit   None    No follow-ups on file.         Ronnell Freshwater, NP  [facility] [phone] (phone) [phone] (fax)  [place]

## 2021-07-23 ENCOUNTER — Encounter: Payer: Self-pay | Admitting: Nurse Practitioner

## 2021-07-23 ENCOUNTER — Ambulatory Visit (INDEPENDENT_AMBULATORY_CARE_PROVIDER_SITE_OTHER): Payer: Medicare Other | Admitting: Nurse Practitioner

## 2021-07-23 VITALS — BP 141/83 | HR 62 | Temp 97.3°F | Ht 64.0 in | Wt 336.0 lb

## 2021-07-23 DIAGNOSIS — E119 Type 2 diabetes mellitus without complications: Secondary | ICD-10-CM | POA: Diagnosis not present

## 2021-07-23 DIAGNOSIS — Z794 Long term (current) use of insulin: Secondary | ICD-10-CM

## 2021-07-23 DIAGNOSIS — L409 Psoriasis, unspecified: Secondary | ICD-10-CM | POA: Diagnosis not present

## 2021-07-23 DIAGNOSIS — M170 Bilateral primary osteoarthritis of knee: Secondary | ICD-10-CM

## 2021-07-23 DIAGNOSIS — I10 Essential (primary) hypertension: Secondary | ICD-10-CM | POA: Diagnosis not present

## 2021-07-23 DIAGNOSIS — E538 Deficiency of other specified B group vitamins: Secondary | ICD-10-CM

## 2021-07-23 LAB — POCT GLYCOSYLATED HEMOGLOBIN (HGB A1C): Hemoglobin A1C: 5.3 % (ref 4.0–5.6)

## 2021-07-23 MED ORDER — SEMAGLUTIDE (1 MG/DOSE) 4 MG/3ML ~~LOC~~ SOPN: 1.0000 mg | PEN_INJECTOR | SUBCUTANEOUS | 2 refills | Status: DC

## 2021-07-24 ENCOUNTER — Encounter: Payer: Medicare Other | Attending: Nurse Practitioner | Admitting: Dietician

## 2021-07-24 ENCOUNTER — Encounter: Payer: Self-pay | Admitting: Dietician

## 2021-07-24 DIAGNOSIS — Z713 Dietary counseling and surveillance: Secondary | ICD-10-CM | POA: Diagnosis not present

## 2021-07-24 DIAGNOSIS — E119 Type 2 diabetes mellitus without complications: Secondary | ICD-10-CM | POA: Insufficient documentation

## 2021-07-24 DIAGNOSIS — Z794 Long term (current) use of insulin: Secondary | ICD-10-CM | POA: Diagnosis not present

## 2021-07-24 LAB — B12 AND FOLATE PANEL
Folate: 16 ng/mL (ref >3.0)
Vitamin B-12: 789 pg/mL (ref 232–1245)

## 2021-07-24 LAB — CBC
Hematocrit: 47.5 % — ABNORMAL HIGH (ref 34.0–46.6)
Hemoglobin: 16.2 g/dL — ABNORMAL HIGH (ref 11.1–15.9)
MCH: 32.2 pg (ref 26.6–33.0)
MCHC: 34.1 g/dL (ref 31.5–35.7)
MCV: 94 fL (ref 79–97)
Platelets: 225 10*3/uL (ref 150–450)
RBC: 5.03 x10E6/uL (ref 3.77–5.28)
RDW: 12.1 % (ref 11.7–15.4)
WBC: 5.1 10*3/uL (ref 3.4–10.8)

## 2021-07-24 LAB — FERRITIN: Ferritin: 176 ng/mL — ABNORMAL HIGH (ref 15–150)

## 2021-07-24 NOTE — Progress Notes (Signed)
Patient was seen on 07/24/2021 for the third of a series of three diabetes self-management courses at [facility].   State the amount of activity recommended for healthy living Describe activities suitable for individual needs Identify ways to regularly incorporate activity into daily life Identify barriers to activity and ways to over come these barriers Identify diabetes medications being personally used and their primary action for lowering glucose and possible side effects Describe role of stress on blood glucose and develop strategies to address psychosocial issues Identify diabetes complications and ways to prevent them Explain how to manage diabetes during illness Evaluate success in meeting personal goal Establish 2-3 goals that they will plan to diligently work on  Goals:  I will be active 30 minutes or more 5 times a week Overcome pain, stress -meditation/therapy  Your patient has identified these potential barriers to change:  Health issues - RA, OA, neuropathy  Your patient has identified their diabetes self-care support plan as  Family Support On-line Resources [facility] Support Group    Plan:  Attend Support Group as desired

## 2021-07-29 ENCOUNTER — Encounter: Payer: Self-pay | Admitting: Nurse Practitioner

## 2021-07-30 ENCOUNTER — Ambulatory Visit: Payer: Medicare Other

## 2021-08-08 ENCOUNTER — Telehealth: Payer: Self-pay

## 2021-08-08 ENCOUNTER — Ambulatory Visit: Payer: Medicare Other | Admitting: Obstetrics and Gynecology

## 2021-08-08 ENCOUNTER — Encounter: Payer: Self-pay | Admitting: Obstetrics and Gynecology

## 2021-08-08 VITALS — BP 124/84 | HR 68 | Resp 20

## 2021-08-08 DIAGNOSIS — Z90711 Acquired absence of uterus with remaining cervical stump: Secondary | ICD-10-CM

## 2021-08-08 DIAGNOSIS — N950 Postmenopausal bleeding: Secondary | ICD-10-CM

## 2021-08-08 DIAGNOSIS — Z79890 Hormone replacement therapy (postmenopausal): Secondary | ICD-10-CM

## 2021-08-08 DIAGNOSIS — Z5181 Encounter for therapeutic drug level monitoring: Secondary | ICD-10-CM

## 2021-08-08 MED ORDER — PROGESTERONE MICRONIZED 100 MG PO CAPS: 100.0000 mg | ORAL_CAPSULE | Freq: Every day | ORAL | 1 refills | Status: DC

## 2021-08-08 MED ORDER — ESTRADIOL 0.0375 MG/24HR TD PTTW: 1.0000 | MEDICATED_PATCH | TRANSDERMAL | 1 refills | Status: DC

## 2021-08-08 NOTE — Progress Notes (Signed)
GYNECOLOGY  VISIT   HPI: 51 y.o.   Married White or Caucasian Not Hispanic or Latino  female   [phone] with No LMP recorded. Patient has had a hysterectomy.   here for  1 mo f/u HRT. Spotting vs vaginal discharge on 08/07/21.  She has a h/o a supracervical hysterectomy.  Last month she was started on vivelle dot 0.025 mg for vasomotor symptoms. Prior to ERT she was having 2-3 hot flashes an hour, now down to 1 an hour. She is up 3-4 x a night, combination of night sweats (2 x a night), chronic pain and need to void. She voids large volumes of urine at night, up to void every few hours (normal Urology evaluation). She has severe sleep apnea and is getting a CPAP.   Yesterday she saw some ?brown d/c, some pink d/c overnight. She is sure it is coming from her vagina.  05/22/21 pap: negative, neg hpv.  She is also on Gabapentin 800 mg 2-3 x a day.   GYNECOLOGIC HISTORY: No LMP recorded. Patient has had a hysterectomy. Contraception:Hysterectomy Menopausal hormone therapy: HRT        OB History     Gravida  8   Para  2   Term  2   Preterm      AB  6   Living  2      SAB  6   IAB      Ectopic      Multiple      Live Births  2              Patient Active Problem List   Diagnosis Date Noted   Vitamin B12 deficiency 07/23/2021   Sleep apnea, unspecified 07/04/2021   Psoriasis and similar disorder 05/27/2021   Prediabetes 05/02/2021   Depression, major, single episode, moderate ([place]) 04/29/2021   Loud snoring 04/17/2021   Menopause 04/16/2021   Body mass index 50.0-59.9, adult ([employer]) 04/05/2021   Morbid obesity ([place]) 04/05/2021   Primary osteoarthritis of right knee 04/04/2021   Primary osteoarthritis of left knee 04/04/2021   Type 2 diabetes mellitus without complication, with long-term current use of insulin ([place]) 11/28/2020   Gastroesophageal reflux disease 11/28/2020   Recurrent UTI 10/17/2020   Dysuria 10/17/2020   Neuropathy 10/11/2020   Hyperlipidemia  10/11/2020   Lymphedema 10/11/2020   Screen for colon cancer 10/10/2020   NASH (nonalcoholic steatohepatitis) 10/10/2020   Rheumatoid arthritis ([place]) 09/26/2020   Primary hypertension 09/26/2020   Osteoarthritis 09/26/2020    Past Medical History:  Diagnosis Date   Abnormal uterine bleeding    Anemia    Blood transfusion without reported diagnosis    Diabetes mellitus without complication ([place])    Dysmenorrhea    Endometriosis    Fibroid    Genital warts    RA (rheumatoid arthritis) ([place])     Past Surgical History:  Procedure Laterality Date   ABDOMINAL HYSTERECTOMY     PARATHYROIDECTOMY      Current Outpatient Medications  Medication Sig Dispense Refill   amLODipine (NORVASC) 10 MG tablet Take 1/2 tablet po QD 30 tablet 1   atorvastatin (LIPITOR) 10 MG tablet Take 1 tablet (10 mg total) by mouth daily. 90 tablet 3   Cholecalciferol (D3 2000 PO) Take 2 tablets by mouth.     clobetasol cream (TEMOVATE) 3.61 % Apply 1 application. topically 2 (two) times daily. 60 g 3   Cranberry 1000 MG CAPS      esomeprazole (NEXIUM) 40  MG capsule Take 40 mg by mouth every other day.     estradiol (ESTRACE) 0.1 MG/GM vaginal cream Estrogen Cream Instruction Discard applicator Apply pea sized amount to tip of finger to urethra before bed. Wash hands well after application. Use Monday, Wednesday and Friday 42.5 g 11   estradiol (VIVELLE-DOT) 0.025 MG/24HR Place 1 patch onto the skin 2 (two) times a week. 8 patch 1   fenofibrate (TRICOR) 145 MG tablet TAKE 1 TABLET(145 MG) BY MOUTH DAILY 90 tablet 0   furosemide (LASIX) 20 MG tablet Take 20 mg by mouth as needed.     HYDROcodone-acetaminophen (NORCO/VICODIN) 5-325 MG tablet Take 1 tablet by mouth every 8 (eight) hours as needed.     leflunomide (ARAVA) 20 MG tablet Take 20 mg by mouth daily.     Lidocaine (BLUE-EMU PAIN RELIEF DRY EX) Apply topically.     losartan (COZAAR) 100 MG tablet TAKE 1 TABLET(100 MG) BY MOUTH DAILY 90 tablet 1    metoprolol succinate (TOPROL-XL) 25 MG 24 hr tablet TAKE 1 TABLET(25 MG) BY MOUTH DAILY 90 tablet 0   pregabalin (LYRICA) 150 MG capsule Take 150 mg by mouth 3 (three) times daily.     Semaglutide, 1 MG/DOSE, 4 MG/3ML SOPN Inject 1 mg as directed once a week. 3 mL 2   SUPER B COMPLEX/C PO Take by mouth.     tiZANidine (ZANAFLEX) 2 MG tablet Take by mouth every 6 (six) hours as needed for muscle spasms.     Tocilizumab (ACTEMRA IV) Inject into the vein.     triamcinolone ointment (KENALOG) 0.1 % Apply 1 application topically 3 (three) times daily. 30 g 2   No current facility-administered medications for this visit.     ALLERGIES: Alcohol and Dilantin [phenytoin]  Family History  Problem Relation Age of Onset   Obesity Mother    Coronary artery disease Mother    Bipolar disorder Mother    Multiple sclerosis Mother    Hypertension Father    Heart attack Father    Vascular Disease Father    Breast cancer Maternal Grandmother 31    Social History   Socioeconomic History   Marital status: Married    Spouse name: Not on file   Number of children: Not on file   Years of education: Not on file   Highest education level: Not on file  Occupational History   Not on file  Tobacco Use   Smoking status: Former    Packs/day: 1.00    Years: 10.00    Total pack years: 10.00    Types: Cigarettes    Quit date: 01/14/2005    Years since quitting: 16.5   Smokeless tobacco: Never  Substance and Sexual Activity   Alcohol use: Not Currently   Drug use: Never   Sexual activity: Yes    Partners: Male    Birth control/protection: Surgical  Other Topics Concern   Not on file  Social History Narrative   Not on file   Social Determinants of Health   Financial Resource Strain: Low Risk  (12/14/2020)   Overall Financial Resource Strain (CARDIA)    Difficulty of Paying Living Expenses: Not hard at all  Food Insecurity: No Food Insecurity (05/10/2021)   Hunger Vital Sign    Worried About  Running Out of Food in the Last Year: Never true    Ran Out of Food in the Last Year: Never true  Transportation Needs: No Transportation Needs (05/10/2021)   [place] - Transportation  Lack of Transportation (Medical): No    Lack of Transportation (Non-Medical): No  Physical Activity: Sufficiently Active (12/14/2020)   Exercise Vital Sign    Days of Exercise per Week: 5 days    Minutes of Exercise per Session: 50 min  Recent Concern: Physical Activity - Insufficiently Active (11/08/2020)   Exercise Vital Sign    Days of Exercise per Week: 6 days    Minutes of Exercise per Session: 20 min  Stress: No Stress Concern Present (12/14/2020)   [place]    Feeling of Stress : Only a little  Social Connections: Moderately Isolated (12/14/2020)   Social Connection and Isolation Panel [NHANES]    Frequency of Communication with Friends and Family: More than three times a week    Frequency of Social Gatherings with Friends and Family: More than three times a week    Attends Religious Services: Never    Marine scientist or Organizations: No    Attends Archivist Meetings: Never    Marital Status: Married  Human resources officer Violence: Not At Risk (12/14/2020)   Humiliation, Afraid, Rape, and Kick questionnaire    Fear of Current or Ex-Partner: No    Emotionally Abused: No    Physically Abused: No    Sexually Abused: No    ROS  PHYSICAL EXAMINATION:    There were no vitals taken for this visit.    General appearance: alert, cooperative and appears stated age  Pelvic: External genitalia:  no lesions              Urethra:  normal appearing urethra with no masses, tenderness or lesions              Bartholins and Skenes: normal                 Vagina: normal appearing vagina with normal color and discharge, no lesions              Cervix: no lesions and appears possibly friable, treated with silver nitrate               Bimanual Exam:  Uterus:   no masses or tenderness              Adnexa: no mass, fullness, tenderness               Chaperone was present for exam.  1. Encounter for monitoring postmenopausal estrogen replacement therapy Minimal help on 0.025 mg estradiol patch, wants to increase. She is aware of the risks - estradiol (VIVELLE-DOT) 0.0375 MG/24HR; Place 1 patch onto the skin 2 (two) times a week.  Dispense: 8 patch; Refill: 1  2. Postmenopausal bleeding H/O [facility], for years after her hysterectomy she would spot cyclically, she must have some endometrium in the upper cervix - US PELVIS TRANSVAGINAL NON-OB (TV ONLY); Future - progesterone (PROMETRIUM) 100 MG capsule; Take 1 capsule (100 mg total) by mouth daily.  Dispense: 30 capsule; Refill: 1  3. History of hysterectomy, supracervical - US PELVIS TRANSVAGINAL NON-OB (TV ONLY); Future

## 2021-08-08 NOTE — Telephone Encounter (Signed)
5 boxes of Ozempic received for the patient. Called and let her know that this was ready to be picked up.   Patient has established care with another provider since patient assistance started with our office. Patient is enrolled through 12/13/2021. Advised patient of this enrollment date and that she may want to discuss with her new provider if she wishes to continue with the patient assistance for her medication. Patient verbalized understanding.

## 2021-08-09 ENCOUNTER — Ambulatory Visit (INDEPENDENT_AMBULATORY_CARE_PROVIDER_SITE_OTHER): Payer: Medicare Other

## 2021-08-09 DIAGNOSIS — N950 Postmenopausal bleeding: Secondary | ICD-10-CM | POA: Diagnosis not present

## 2021-08-09 DIAGNOSIS — Z90711 Acquired absence of uterus with remaining cervical stump: Secondary | ICD-10-CM | POA: Diagnosis not present

## 2021-08-10 ENCOUNTER — Telehealth: Payer: Self-pay | Admitting: Obstetrics and Gynecology

## 2021-08-10 DIAGNOSIS — N950 Postmenopausal bleeding: Secondary | ICD-10-CM

## 2021-08-10 NOTE — Telephone Encounter (Signed)
Patient informed with below note. Patient reports this am she had intercourse and noticed bleeding, reports it was more blood then she told you about before.

## 2021-08-10 NOTE — Telephone Encounter (Signed)
Please let the patient know that her pelvic ultrasound from yesterday is normal.

## 2021-08-13 ENCOUNTER — Telehealth: Payer: Self-pay | Admitting: Primary Care

## 2021-08-13 ENCOUNTER — Other Ambulatory Visit: Payer: Self-pay | Admitting: Gastroenterology

## 2021-08-13 ENCOUNTER — Encounter: Payer: Self-pay | Admitting: Gastroenterology

## 2021-08-13 ENCOUNTER — Ambulatory Visit: Payer: Medicare Other | Admitting: Gastroenterology

## 2021-08-13 DIAGNOSIS — K7581 Nonalcoholic steatohepatitis (NASH): Secondary | ICD-10-CM | POA: Diagnosis not present

## 2021-08-13 DIAGNOSIS — Z23 Encounter for immunization: Secondary | ICD-10-CM | POA: Diagnosis not present

## 2021-08-13 DIAGNOSIS — E1142 Type 2 diabetes mellitus with diabetic polyneuropathy: Secondary | ICD-10-CM | POA: Insufficient documentation

## 2021-08-13 NOTE — Telephone Encounter (Signed)
It would be helpful for me to see her when she is bleeding to help determine if the bleeding is coming from inside of her cervix or from the outside. Have her call with recurrent bleeding.  It will also be helpful to see if the prometrium she was just started on will help.

## 2021-08-13 NOTE — Telephone Encounter (Signed)
Left message for patient to call.

## 2021-08-13 NOTE — Telephone Encounter (Signed)
Has CPAP titration study been scheduled??

## 2021-08-13 NOTE — Assessment & Plan Note (Signed)
-   Patient has symptoms of loud snoring, restless sleep, witnessed apnea and excessive daytime sleepiness. Epworth score 14. BMI 57. HST 05/29/21 showed severe OSA, AHI 78/hour. We reviewed treatment options, due to severity of sleep apnea recommend patient be started on CPAP. She will need CPAP titration to determine coreect pressure setting.

## 2021-08-13 NOTE — Progress Notes (Signed)
[name] MD, MRCP(U.K) [address]  [place]  [place], [place] [address]  Main: [phone]  Fax: [phone]   Primary Care Physician: Ronnell Freshwater, NP  Primary Gastroenterologist:  Dr. [name]   Chief Complaint  Patient presents with   NASH    HPI: Elizabeth Patrick is a 51 y.o. 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  In 2021 when the patient lived in [place] was found to have elevated liver function tests while on methotrexate for rheumatoid arthritis and underwent a liver biopsy which showed features of [name] but no evidence of cirrhosis.  No fibrosis noted either.  Subsequently lost weight intentionally.  No alcohol use.  History of hyperlipidemia, obesity, hypertension. 12/12/2020 [name] FibroSure test showed a fibrosis score of 0.4 which is an F1 F2 fibrosis and a Nash score of 0.75 indicating NASH and steatosis of score of 0.43 indicating minimal steatosis: 12/21/2020: Right upper quadrant ultrasound shows hepatic steatosis   12/12/2020 indicated not immune to hepatitis A and B and will need vaccination.  Ceruloplasmin levels were low recommended rechecking     Interval history   04/11/2021-08/13/2021  Lost 6 pounds since last visit  04/11/2021: Ceruloplasmin 12.1 05/10/2021 urinary copper excretion within normal limits 07/23/2021 hemoglobin 16.2, ferritin 176, B12 folate normal  On Ozempic.  Lost about 60 pounds in total over the past 2 visits.  Doing well.  Denies any alcohol use.  Trying to exercise.  No over-the-counter medications   Current Outpatient Medications  Medication Sig Dispense Refill   amLODipine (NORVASC) 10 MG tablet Take 1/2 tablet po QD 30 tablet 1   atorvastatin (LIPITOR) 10 MG tablet Take 1 tablet (10 mg total) by mouth daily. 90 tablet 3   Cholecalciferol (D3 2000 PO) Take 2 tablets by mouth.     clobetasol cream (TEMOVATE) 4.58 % Apply 1 application. topically 2 (two) times  daily. 60 g 3   Cranberry 1000 MG CAPS      esomeprazole (NEXIUM) 40 MG capsule Take 40 mg by mouth every other day.     estradiol (ESTRACE) 0.1 MG/GM vaginal cream Estrogen Cream Instruction Discard applicator Apply pea sized amount to tip of finger to urethra before bed. Wash hands well after application. Use Monday, Wednesday and Friday 42.5 g 11   estradiol (VIVELLE-DOT) 0.0375 MG/24HR Place 1 patch onto the skin 2 (two) times a week. 8 patch 1   fenofibrate (TRICOR) 145 MG tablet TAKE 1 TABLET(145 MG) BY MOUTH DAILY 90 tablet 0   furosemide (LASIX) 20 MG tablet Take 20 mg by mouth as needed.     HYDROcodone-acetaminophen (NORCO/VICODIN) 5-325 MG tablet Take 1 tablet by mouth every 8 (eight) hours as needed.     leflunomide (ARAVA) 20 MG tablet Take 20 mg by mouth daily.     Lidocaine (BLUE-EMU PAIN RELIEF DRY EX) Apply topically.     losartan (COZAAR) 100 MG tablet TAKE 1 TABLET(100 MG) BY MOUTH DAILY 90 tablet 1   metoprolol succinate (TOPROL-XL) 25 MG 24 hr tablet TAKE 1 TABLET(25 MG) BY MOUTH DAILY 90 tablet 0   pregabalin (LYRICA) 150 MG capsule Take 150 mg by mouth 3 (three) times daily.     progesterone (PROMETRIUM) 100 MG capsule Take 1 capsule (100 mg total) by mouth daily. 30 capsule 1   Semaglutide, 1 MG/DOSE, 4 MG/3ML SOPN Inject 1 mg as directed once a week. 3 mL 2  SUPER B COMPLEX/C PO Take by mouth.     tiZANidine (ZANAFLEX) 2 MG tablet Take by mouth every 6 (six) hours as needed for muscle spasms.     Tocilizumab (ACTEMRA IV) Inject into the vein.     triamcinolone ointment (KENALOG) 0.1 % Apply 1 application topically 3 (three) times daily. 30 g 2   No current facility-administered medications for this visit.    Allergies as of 08/13/2021 - Review Complete 08/13/2021  Allergen Reaction Noted   Alcohol Anaphylaxis 09/26/2020   Dilantin [phenytoin] Anaphylaxis 09/26/2020    ROS:  General: Negative for anorexia, weight loss, fever, chills, fatigue, weakness. ENT:  Negative for hoarseness, difficulty swallowing , nasal congestion. CV: Negative for chest pain, angina, palpitations, dyspnea on exertion, peripheral edema.  Respiratory: Negative for dyspnea at rest, dyspnea on exertion, cough, sputum, wheezing.  GI: See history of present illness. GU:  Negative for dysuria, hematuria, urinary incontinence, urinary frequency, nocturnal urination.  Endo: Negative for unusual weight change.    Physical Examination:   BP (!) 149/92   Pulse 66   Temp 99.1 F (37.3 C) (Oral)   Ht '5\' 4"'$  (1.626 m)   Wt (!) 333 lb 9.6 oz (151.3 kg)   BMI 57.26 kg/m   General: Well-nourished, well-developed in no acute distress.  Eyes: No icterus. Conjunctivae pink. Mouth: Oropharyngeal mucosa moist and pink , no lesions erythema or exudate. Neuro: Alert and oriented x 3.  Grossly intact. Skin: Warm and dry, no jaundice.   Psych: Alert and cooperative, normal mood and affect.   Imaging Studies: US PELVIS TRANSVAGINAL NON-OB (TV ONLY)  Result Date: 08/10/2021 Pelvic ultrasound Indications: PMP bleeding, h/o supracervical hysterectomy Findings: Uterus absent Cervix: normal, no thickening or internal vascularity Left ovary 2.95 x 1.71 x 1.91 cm Right ovary 2.96 x 1.46 x 2.29 cm No free fluid Impression: Normal cervix Absent uterus Normal ovaries bilaterally    Assessment and Plan:   Elizabeth Patrick is a 51 y.o. y/o female here to follow-up for NASH.  Right upper quadrant ultrasound shows hepatic steatosis.  [name] FibroSure test indicates [place] and F1 F2 fibrosis ,  she has features suggestive of metabolic syndrome.  By her liver biopsy in 2021 shows NASH with no cirrhotic findings or fibrosis.  Incidental finding of low ceruloplasmin levels.  24-hour urinary copper levels were normal. She has had a stool tests she states was negative for colon cancer screening.     Plan  Counseled on weight loss, tight glycemic control, management of cardiovascular risk factors as key to  management of NAFLD. Limit alcohol intake Requires zoster vaccine as she is over the age of 30, she does not recollect having a episode of chickenpox we will check her varicella IgG levels. Low ceruloplasmin, no excessive urinary excretion of copper.  Since patient already had a liver biopsy in 2021, no copper levels were checked at that point hence will perform ATP 7B mutation analysis as per algorithm.  We will also recommend to check Beatriz Chancellor rings by her ophthalmologist       Dr [name]  MD,MRCP [facility]) Follow up in 6 months

## 2021-08-14 DIAGNOSIS — K7581 Nonalcoholic steatohepatitis (NASH): Secondary | ICD-10-CM | POA: Diagnosis not present

## 2021-08-14 DIAGNOSIS — M0609 Rheumatoid arthritis without rheumatoid factor, multiple sites: Secondary | ICD-10-CM | POA: Diagnosis not present

## 2021-08-15 LAB — VARICELLA ZOSTER ANTIBODY, IGG: Varicella zoster IgG: 892 index (ref >165)

## 2021-08-15 NOTE — Progress Notes (Signed)
Elizabeth Patrick needs Shigles vaccine - shingrix please inform patient to obtain from PCP or [facility]. Antibody is positive .   Dr [name] MD,MRCP [facility]) Gastroenterology/Hepatology Pager: [phone]

## 2021-08-15 NOTE — Progress Notes (Signed)
Hey. She is scheduled for B12 injection upcoming. Can you see if she is willing to add initial shingles vaccine when she comes in? Recommended per GI.  Thanks so much.   -HB

## 2021-08-16 NOTE — Addendum Note (Signed)
Addended by: Wayna Chalet on: 08/16/2021 09:03 AM   Modules accepted: Orders

## 2021-08-20 NOTE — Telephone Encounter (Signed)
Medication picked up by the patient.

## 2021-08-22 ENCOUNTER — Other Ambulatory Visit: Payer: Self-pay | Admitting: Nurse Practitioner

## 2021-08-22 DIAGNOSIS — E669 Obesity, unspecified: Secondary | ICD-10-CM

## 2021-08-22 DIAGNOSIS — I10 Essential (primary) hypertension: Secondary | ICD-10-CM

## 2021-08-22 DIAGNOSIS — G629 Polyneuropathy, unspecified: Secondary | ICD-10-CM

## 2021-08-22 MED ORDER — PROGESTERONE MICRONIZED 100 MG PO CAPS: 100.0000 mg | ORAL_CAPSULE | Freq: Every day | ORAL | 3 refills | Status: DC

## 2021-08-22 NOTE — Telephone Encounter (Signed)
She should call with recurrent bleeding. I would recommend that she continue on the prometrium, 100 mg. Please call in a 90 day supply with 3 refills

## 2021-08-22 NOTE — Telephone Encounter (Signed)
Dr.Jertson please see patient my chart message attached to this encounter.

## 2021-08-27 ENCOUNTER — Telehealth: Payer: Self-pay | Admitting: Nurse Practitioner

## 2021-08-27 NOTE — Telephone Encounter (Signed)
Patient is having severe pain low back pain and right leg. She is unsure if its due to the rheumatoid arthritis or if it could be a clot as a side effect to her medication. There are no appointments with you at all this week. What does she need to do?

## 2021-08-27 NOTE — Telephone Encounter (Signed)
Patient aware.

## 2021-08-28 DIAGNOSIS — X32XXXS Exposure to sunlight, sequela: Secondary | ICD-10-CM | POA: Diagnosis not present

## 2021-08-28 DIAGNOSIS — L814 Other melanin hyperpigmentation: Secondary | ICD-10-CM | POA: Diagnosis not present

## 2021-08-28 DIAGNOSIS — D225 Melanocytic nevi of trunk: Secondary | ICD-10-CM | POA: Diagnosis not present

## 2021-08-28 DIAGNOSIS — I781 Nevus, non-neoplastic: Secondary | ICD-10-CM | POA: Diagnosis not present

## 2021-08-29 ENCOUNTER — Ambulatory Visit: Payer: Medicare Other | Attending: Neurology

## 2021-08-29 DIAGNOSIS — M1991 Primary osteoarthritis, unspecified site: Secondary | ICD-10-CM | POA: Diagnosis not present

## 2021-08-29 DIAGNOSIS — M0609 Rheumatoid arthritis without rheumatoid factor, multiple sites: Secondary | ICD-10-CM | POA: Diagnosis not present

## 2021-08-29 DIAGNOSIS — G4733 Obstructive sleep apnea (adult) (pediatric): Secondary | ICD-10-CM | POA: Diagnosis not present

## 2021-08-29 DIAGNOSIS — L403 Pustulosis palmaris et plantaris: Secondary | ICD-10-CM | POA: Diagnosis not present

## 2021-08-29 DIAGNOSIS — M5136 Other intervertebral disc degeneration, lumbar region: Secondary | ICD-10-CM | POA: Diagnosis not present

## 2021-08-31 ENCOUNTER — Ambulatory Visit (INDEPENDENT_AMBULATORY_CARE_PROVIDER_SITE_OTHER): Payer: Medicare Other | Admitting: Nurse Practitioner

## 2021-08-31 VITALS — BP 122/68 | HR 67 | Ht 64.0 in | Wt 322.4 lb

## 2021-08-31 DIAGNOSIS — E538 Deficiency of other specified B group vitamins: Secondary | ICD-10-CM | POA: Diagnosis not present

## 2021-08-31 MED ORDER — CYANOCOBALAMIN 1000 MCG/ML IJ SOLN
1000.0000 ug | Freq: Once | INTRAMUSCULAR | Status: AC
  Administered 2021-08-31: 1000 ug via INTRAMUSCULAR

## 2021-08-31 NOTE — Progress Notes (Signed)
Patient is here for Vitamin B12 injection  Denies GI problems or dizziness Pt tolerated injection well without complication  Pt advised to make appt

## 2021-09-05 ENCOUNTER — Ambulatory Visit: Payer: Medicare Other | Admitting: Obstetrics and Gynecology

## 2021-09-06 ENCOUNTER — Telehealth (HOSPITAL_BASED_OUTPATIENT_CLINIC_OR_DEPARTMENT_OTHER): Payer: Medicare Other | Admitting: Pulmonary Disease

## 2021-09-06 DIAGNOSIS — G4733 Obstructive sleep apnea (adult) (pediatric): Secondary | ICD-10-CM | POA: Diagnosis not present

## 2021-09-06 DIAGNOSIS — L603 Nail dystrophy: Secondary | ICD-10-CM | POA: Diagnosis not present

## 2021-09-06 DIAGNOSIS — R21 Rash and other nonspecific skin eruption: Secondary | ICD-10-CM | POA: Diagnosis not present

## 2021-09-06 DIAGNOSIS — B353 Tinea pedis: Secondary | ICD-10-CM | POA: Diagnosis not present

## 2021-09-06 NOTE — Telephone Encounter (Signed)
Successful titration. Very high pressures required, hence BiPAP was used. BiPAP 23/18 was final pressure Suggest auto BiPAP , EPAP minimum 10 cm, pressure support +5, IPAP maximum 23 cm

## 2021-09-07 NOTE — Telephone Encounter (Signed)
Please let patient know PAP titration was successful. Please place order for patient to be started on auto BIPAP- min EPAP 10cm h20, max IPAP 23 cm h20, PS 5.  Once patient receives BiPAP advised that she wear every night for minimum 4 to 6 hours.  She will need to follow-up 31 to 90 days after starting CPAP therapy for compliance check.  Original sleep study 05/29/21 re: loud snoring

## 2021-09-10 LAB — NGS CUSTOM PANEL

## 2021-09-11 ENCOUNTER — Other Ambulatory Visit: Payer: Self-pay | Admitting: Nurse Practitioner

## 2021-09-11 ENCOUNTER — Telehealth: Payer: Self-pay | Admitting: Nurse Practitioner

## 2021-09-11 DIAGNOSIS — E669 Obesity, unspecified: Secondary | ICD-10-CM

## 2021-09-11 DIAGNOSIS — G629 Polyneuropathy, unspecified: Secondary | ICD-10-CM

## 2021-09-11 DIAGNOSIS — I10 Essential (primary) hypertension: Secondary | ICD-10-CM

## 2021-09-11 MED ORDER — ATORVASTATIN CALCIUM 10 MG PO TABS: 10.0000 mg | ORAL_TABLET | Freq: Every day | ORAL | 3 refills | Status: DC

## 2021-09-11 NOTE — Progress Notes (Signed)
Sent new 90 day prescription for atorvastatin to [facility] in [place]

## 2021-09-11 NOTE — Telephone Encounter (Signed)
Patient requesting refill of Atorvastatin 10 mg. Please advise.

## 2021-09-11 NOTE — Telephone Encounter (Signed)
Sent new 90 day prescription for atorvastatin to [facility] in [place]

## 2021-09-12 ENCOUNTER — Encounter: Payer: Self-pay | Admitting: Gastroenterology

## 2021-09-12 DIAGNOSIS — M0609 Rheumatoid arthritis without rheumatoid factor, multiple sites: Secondary | ICD-10-CM | POA: Diagnosis not present

## 2021-09-12 NOTE — Telephone Encounter (Signed)
I cannot see her titres that she is referring on Epic- can we find out?

## 2021-09-13 ENCOUNTER — Ambulatory Visit: Payer: Medicare Other

## 2021-09-13 NOTE — Telephone Encounter (Signed)
Called and spoke with pt letting her know the info from the titration study and she verbalized understanding. Order for bipap start has been placed. Nothing further needed.

## 2021-09-20 ENCOUNTER — Ambulatory Visit: Payer: Self-pay | Admitting: Obstetrics and Gynecology

## 2021-09-20 DIAGNOSIS — Z0289 Encounter for other administrative examinations: Secondary | ICD-10-CM

## 2021-09-20 NOTE — Progress Notes (Deleted)
GYNECOLOGY  VISIT   HPI: 51 y.o.   Married White or Caucasian Not Hispanic or Latino  female   [phone] with No LMP recorded. Patient has had a hysterectomy.   here for f/u of HRT. She has a h/o a supracervical hysterectomy and noted spotting after starting the 0.025 mg Vivelle dot in 6/23 (she previously had cyclic spotting after her hysterectomy). Her cervix appeared mildly friable in 7/23 and was treated with silver nitrate.  She also had an ultrasound that showed an absent uterus and a normal appearing cervix without thickening or internal vascularity. Normal ovaries bilaterally. She was started on prometrium and secondary to continued vasomotor symptoms her Vivelle dot 0.0375 mg.   GYNECOLOGIC HISTORY: No LMP recorded. Patient has had a hysterectomy. Contraception:PMP Menopausal hormone therapy: yes        OB History     Gravida  8   Para  2   Term  2   Preterm      AB  6   Living  2      SAB  6   IAB      Ectopic      Multiple      Live Births  2              Patient Active Problem List   Diagnosis Date Noted   Diabetic polyneuropathy associated with type 2 diabetes mellitus ([place]) 08/13/2021   Vitamin B12 deficiency 07/23/2021   Severe sleep apnea 07/04/2021   Psoriasis and similar disorder 05/27/2021   Prediabetes 05/02/2021   Depression, major, single episode, moderate ([place]) 04/29/2021   Loud snoring 04/17/2021   Menopause 04/16/2021   Body mass index 50.0-59.9, adult ([place]) 04/05/2021   Morbid obesity ([place]) 04/05/2021   Primary osteoarthritis of right knee 04/04/2021   Primary osteoarthritis of left knee 04/04/2021   Type 2 diabetes mellitus without complication, with long-term current use of insulin ([place]) 11/28/2020   Gastroesophageal reflux disease 11/28/2020   Recurrent UTI 10/17/2020   Dysuria 10/17/2020   Neuropathy 10/11/2020   Hyperlipidemia 10/11/2020   Lymphedema 10/11/2020   Screen for colon cancer 10/10/2020   NASH (nonalcoholic  steatohepatitis) 10/10/2020   Rheumatoid arthritis ([place]) 09/26/2020   Primary hypertension 09/26/2020   Osteoarthritis 09/26/2020    Past Medical History:  Diagnosis Date   Abnormal uterine bleeding    Anemia    Blood transfusion without reported diagnosis    Diabetes mellitus without complication ([place])    Dysmenorrhea    Endometriosis    Fibroid    Genital warts    RA (rheumatoid arthritis) ([place])     Past Surgical History:  Procedure Laterality Date   ABDOMINAL HYSTERECTOMY     PARATHYROIDECTOMY      Current Outpatient Medications  Medication Sig Dispense Refill   amLODipine (NORVASC) 10 MG tablet TAKE 1 TABLET BY MOUTH DAILY 90 tablet 1   atorvastatin (LIPITOR) 10 MG tablet Take 1 tablet (10 mg total) by mouth daily. 90 tablet 3   Cholecalciferol (D3 2000 PO) Take 2 tablets by mouth.     clobetasol cream (TEMOVATE) 4.00 % Apply 1 application. topically 2 (two) times daily. 60 g 3   Cranberry 1000 MG CAPS      esomeprazole (NEXIUM) 40 MG capsule Take 40 mg by mouth every other day.     estradiol (ESTRACE) 0.1 MG/GM vaginal cream Estrogen Cream Instruction Discard applicator Apply pea sized amount to tip of finger to urethra before bed. Wash hands well after  application. Use Monday, Wednesday and Friday 42.5 g 11   estradiol (VIVELLE-DOT) 0.0375 MG/24HR Place 1 patch onto the skin 2 (two) times a week. 8 patch 1   fenofibrate (TRICOR) 145 MG tablet TAKE 1 TABLET(145 MG) BY MOUTH DAILY 90 tablet 0   furosemide (LASIX) 20 MG tablet Take 20 mg by mouth as needed.     HYDROcodone-acetaminophen (NORCO/VICODIN) 5-325 MG tablet Take 1 tablet by mouth every 8 (eight) hours as needed.     leflunomide (ARAVA) 20 MG tablet Take 20 mg by mouth daily.     Lidocaine (BLUE-EMU PAIN RELIEF DRY EX) Apply topically.     losartan (COZAAR) 100 MG tablet TAKE 1 TABLET(100 MG) BY MOUTH DAILY 90 tablet 1   metoprolol succinate (TOPROL-XL) 25 MG 24 hr tablet TAKE 1 TABLET(25 MG) BY MOUTH DAILY 90  tablet 0   pregabalin (LYRICA) 150 MG capsule Take 150 mg by mouth 3 (three) times daily.     progesterone (PROMETRIUM) 100 MG capsule Take 1 capsule (100 mg total) by mouth daily. 90 capsule 3   Semaglutide, 1 MG/DOSE, 4 MG/3ML SOPN Inject 1 mg as directed once a week. 3 mL 2   SUPER B COMPLEX/C PO Take by mouth.     tiZANidine (ZANAFLEX) 2 MG tablet Take by mouth every 6 (six) hours as needed for muscle spasms.     Tocilizumab (ACTEMRA IV) Inject into the vein.     triamcinolone ointment (KENALOG) 0.1 % Apply 1 application topically 3 (three) times daily. 30 g 2   No current facility-administered medications for this visit.     ALLERGIES: Alcohol and Dilantin [phenytoin]  Family History  Problem Relation Age of Onset   Obesity Mother    Coronary artery disease Mother    Bipolar disorder Mother    Multiple sclerosis Mother    Hypertension Father    Heart attack Father    Vascular Disease Father    Breast cancer Maternal Grandmother 59    Social History   Socioeconomic History   Marital status: Married    Spouse name: Not on file   Number of children: Not on file   Years of education: Not on file   Highest education level: Not on file  Occupational History   Not on file  Tobacco Use   Smoking status: Former    Packs/day: 1.00    Years: 10.00    Total pack years: 10.00    Types: Cigarettes    Quit date: 01/14/2005    Years since quitting: 16.6   Smokeless tobacco: Never  Substance and Sexual Activity   Alcohol use: Not Currently   Drug use: Never   Sexual activity: Yes    Partners: Male    Birth control/protection: Surgical  Other Topics Concern   Not on file  Social History Narrative   Not on file   Social Determinants of Health   Financial Resource Strain: Low Risk  (12/14/2020)   Overall Financial Resource Strain (CARDIA)    Difficulty of Paying Living Expenses: Not hard at all  Food Insecurity: No Food Insecurity (05/10/2021)   Hunger Vital Sign     Worried About Running Out of Food in the Last Year: Never true    Ran Out of Food in the Last Year: Never true  Transportation Needs: No Transportation Needs (05/10/2021)   PRAPARE - Hydrologist (Medical): No    Lack of Transportation (Non-Medical): No  Physical Activity: Sufficiently Active (12/14/2020)  Exercise Vital Sign    Days of Exercise per Week: 5 days    Minutes of Exercise per Session: 50 min  Recent Concern: Physical Activity - Insufficiently Active (11/08/2020)   Exercise Vital Sign    Days of Exercise per Week: 6 days    Minutes of Exercise per Session: 20 min  Stress: No Stress Concern Present (12/14/2020)   [place]    Feeling of Stress : Only a little  Social Connections: Moderately Isolated (12/14/2020)   Social Connection and Isolation Panel [NHANES]    Frequency of Communication with Friends and Family: More than three times a week    Frequency of Social Gatherings with Friends and Family: More than three times a week    Attends Religious Services: Never    Marine scientist or Organizations: No    Attends Archivist Meetings: Never    Marital Status: Married  Human resources officer Violence: Not At Risk (12/14/2020)   Humiliation, Afraid, Rape, and Kick questionnaire    Fear of Current or Ex-Partner: No    Emotionally Abused: No    Physically Abused: No    Sexually Abused: No    ROS  PHYSICAL EXAMINATION:    There were no vitals taken for this visit.    General appearance: alert, cooperative and appears stated age Neck: no adenopathy, supple, symmetrical, trachea midline and thyroid {CHL AMB PHY EX THYROID NORM DEFAULT:[phone]::"normal to inspection and palpation"} Breasts: {Exam; breast:13139::"normal appearance, no masses or tenderness"} Abdomen: soft, non-tender; non distended, no masses,  no organomegaly  Pelvic: External genitalia:  no lesions               Urethra:  normal appearing urethra with no masses, tenderness or lesions              Bartholins and Skenes: normal                 Vagina: normal appearing vagina with normal color and discharge, no lesions              Cervix: {CHL AMB PHY EX CERVIX NORM DEFAULT:[phone]::"no lesions"}              Bimanual Exam:  Uterus:  {CHL AMB PHY EX UTERUS NORM [phone]::"normal size, contour, position, consistency, mobility, non-tender"}              Adnexa: {CHL AMB PHY EX ADNEXA NO MASS [phone]::"no mass, fullness, tenderness"}              Rectovaginal: {yes no:[id number]}.  Confirms.              Anus:  normal sphincter tone, no lesions  Chaperone was present for exam.  ASSESSMENT     PLAN    An After Visit Summary was printed and given to the patient.  *** minutes face to face time of which over 50% was spent in counseling.

## 2021-09-25 ENCOUNTER — Other Ambulatory Visit: Payer: Self-pay

## 2021-09-25 ENCOUNTER — Ambulatory Visit: Payer: Medicare Other | Admitting: Podiatry

## 2021-09-25 DIAGNOSIS — E669 Obesity, unspecified: Secondary | ICD-10-CM

## 2021-09-25 DIAGNOSIS — G629 Polyneuropathy, unspecified: Secondary | ICD-10-CM

## 2021-09-25 DIAGNOSIS — B351 Tinea unguium: Secondary | ICD-10-CM

## 2021-09-25 DIAGNOSIS — I890 Lymphedema, not elsewhere classified: Secondary | ICD-10-CM

## 2021-09-25 DIAGNOSIS — M79674 Pain in right toe(s): Secondary | ICD-10-CM | POA: Diagnosis not present

## 2021-09-25 DIAGNOSIS — I10 Essential (primary) hypertension: Secondary | ICD-10-CM

## 2021-09-25 DIAGNOSIS — M79675 Pain in left toe(s): Secondary | ICD-10-CM

## 2021-09-25 MED ORDER — FENOFIBRATE 145 MG PO TABS: ORAL_TABLET | ORAL | 0 refills | Status: DC

## 2021-09-25 NOTE — Patient Instructions (Signed)
Add alpha-lipoic acid and a B-complex vitamin daily

## 2021-09-25 NOTE — Progress Notes (Signed)
Subjective:   [name], female   DOB: 51 y.o.   MRN: [id number]   HPI Chief Complaint  Patient presents with   Ingrown Toenail    Bilateral hallux ingrown, neuropathy, arthritis, Yeast infection, Diabetic foot care, A1c-5.9 GB- Doesn't take it very day, numbness on the bottom of the feet and burning on the top of the feet, sharp shoot pain from toes,    51 year old female presents the above concerns.  She said the biggest issue is she has difficulty with numbness in her feet.  She states she actually stepped on attack and she did not feel it until she noticed it bleeding.  This is not a new issue.  She also states that she cannot feel her feet and she feels others ice water on her feet and the burning.  She tired gabapentin and lyrica. Sees pain management.   She has difficulty trimming her nails and may cause discomfort thickened elongated.  Chronic swelling to her legs.    Review of Systems  All other systems reviewed and are negative.  Past Medical History:  Diagnosis Date   Abnormal uterine bleeding    Anemia    Blood transfusion without reported diagnosis    Diabetes mellitus without complication ([facility])    Dysmenorrhea    Endometriosis    Fibroid    Genital warts    RA (rheumatoid arthritis) ([facility])     Past Surgical History:  Procedure Laterality Date   ABDOMINAL HYSTERECTOMY     PARATHYROIDECTOMY       Current Outpatient Medications:    amLODipine (NORVASC) 10 MG tablet, TAKE 1 TABLET BY MOUTH DAILY, Disp: 90 tablet, Rfl: 1   atorvastatin (LIPITOR) 10 MG tablet, Take 1 tablet (10 mg total) by mouth daily., Disp: 90 tablet, Rfl: 3   Cholecalciferol (D3 2000 PO), Take 2 tablets by mouth., Disp: , Rfl:    clobetasol cream (TEMOVATE) 5.49 %, Apply 1 application. topically 2 (two) times daily., Disp: 60 g, Rfl: 3   Cranberry 1000 MG CAPS, , Disp: , Rfl:    esomeprazole (NEXIUM) 40 MG capsule, Take 40 mg by mouth every other day., Disp: , Rfl:     estradiol (ESTRACE) 0.1 MG/GM vaginal cream, Estrogen Cream Instruction Discard applicator Apply pea sized amount to tip of finger to urethra before bed. Wash hands well after application. Use Monday, Wednesday and Friday, Disp: 42.5 g, Rfl: 11   estradiol (VIVELLE-DOT) 0.0375 MG/24HR, Place 1 patch onto the skin 2 (two) times a week., Disp: 8 patch, Rfl: 1   fenofibrate (TRICOR) 145 MG tablet, TAKE 1 TABLET(145 MG) BY MOUTH DAILY, Disp: 30 tablet, Rfl: 0   furosemide (LASIX) 20 MG tablet, Take 20 mg by mouth as needed., Disp: , Rfl:    HYDROcodone-acetaminophen (NORCO/VICODIN) 5-325 MG tablet, Take 1 tablet by mouth every 8 (eight) hours as needed., Disp: , Rfl:    leflunomide (ARAVA) 20 MG tablet, Take 20 mg by mouth daily., Disp: , Rfl:    Lidocaine (BLUE-EMU PAIN RELIEF DRY EX), Apply topically., Disp: , Rfl:    losartan (COZAAR) 100 MG tablet, TAKE 1 TABLET(100 MG) BY MOUTH DAILY, Disp: 90 tablet, Rfl: 1   metoprolol succinate (TOPROL-XL) 25 MG 24 hr tablet, TAKE 1 TABLET(25 MG) BY MOUTH DAILY, Disp: 90 tablet, Rfl: 0   pregabalin (LYRICA) 150 MG capsule, Take 150 mg by mouth 3 (three) times daily., Disp: , Rfl:    progesterone (PROMETRIUM) 100 MG capsule, Take 1 capsule (  100 mg total) by mouth daily., Disp: 90 capsule, Rfl: 3   Semaglutide, 1 MG/DOSE, 4 MG/3ML SOPN, Inject 1 mg as directed once a week., Disp: 3 mL, Rfl: 2   SUPER B COMPLEX/C PO, Take by mouth., Disp: , Rfl:    tiZANidine (ZANAFLEX) 2 MG tablet, Take by mouth every 6 (six) hours as needed for muscle spasms., Disp: , Rfl:    Tocilizumab (ACTEMRA IV), Inject into the vein., Disp: , Rfl:    triamcinolone ointment (KENALOG) 0.1 %, Apply 1 application topically 3 (three) times daily., Disp: 30 g, Rfl: 2  Allergies  Allergen Reactions   Alcohol Anaphylaxis   Dilantin [Phenytoin] Anaphylaxis          Objective:  Physical Exam  General: AAO x3, NAD  Dermatological: Nails are very minimally hypertrophic and there are  dystrophic with yellow, brown discoloration.  There is tenderness nails 1-5 bilaterally.  No edema, erythema or signs of infection.  Dry skin present.  No open lesions.  Vascular: Dorsalis Pedis artery and Posterior Tibial artery pedal pulses are 2/4 bilateral with immedate capillary fill time. Chronic edema to her legs consistent with lymphedema.  Neruologic: Sensation decreased with Semmes Weinstein monofilament  Musculoskeletal: No area pinpoint tenderness.       Assessment:   51 year old female with neuropathy, symptomatic onychomycosis, chronic edema bilateral lower extremities     Plan:  -Treatment options discussed including all alternatives, risks, and complications. -Etiology of symptoms were discussed -Sharply debrided the nails x2 without any complications or bleeding. -I measured her legs and we are going to order juxta light compression wraps.  -Continue current medication by pain management for the neuropathy.  She has not had B complex vitamin as well as alpha lipoic acid. -Daily foot inspection    Trula Slade DPM

## 2021-09-27 ENCOUNTER — Ambulatory Visit: Payer: Medicare Other | Admitting: Podiatry

## 2021-09-27 NOTE — Progress Notes (Signed)
GYNECOLOGY  VISIT   HPI: 51 y.o.   Married White or Caucasian Not Hispanic or Latino  female   [phone] with No LMP recorded. Patient has had a hysterectomy.   here for f/u on HRT. Pt reports that increased dose of estrogen patch from .025 to .0375 is doing ok. Pt reports that she has noticed change in moods and reports night sweats are almost gone. No night sweats, having ~1 hot flash a day.   Her vagina feels a little more sensitive. A little burning after intercourse. She doesn't have dryness. Also leaking some urine with sex.   GYNECOLOGIC HISTORY: No LMP recorded. Patient has had a hysterectomy. Contraception: Hyst Menopausal hormone therapy: Estrogen Patch/Cream, Progesterone        OB History     Gravida  8   Para  2   Term  2   Preterm      AB  6   Living  2      SAB  6   IAB      Ectopic      Multiple      Live Births  2              Patient Active Problem List   Diagnosis Date Noted   Diabetic polyneuropathy associated with type 2 diabetes mellitus ([place]) 08/13/2021   Vitamin B12 deficiency 07/23/2021   Severe sleep apnea 07/04/2021   Psoriasis and similar disorder 05/27/2021   Prediabetes 05/02/2021   Depression, major, single episode, moderate ([place]) 04/29/2021   Loud snoring 04/17/2021   Menopause 04/16/2021   Body mass index 50.0-59.9, adult ([place]) 04/05/2021   Morbid obesity ([place]) 04/05/2021   Primary osteoarthritis of right knee 04/04/2021   Primary osteoarthritis of left knee 04/04/2021   Type 2 diabetes mellitus without complication, with long-term current use of insulin ([place]) 11/28/2020   Gastroesophageal reflux disease 11/28/2020   Recurrent UTI 10/17/2020   Dysuria 10/17/2020   Neuropathy 10/11/2020   Hyperlipidemia 10/11/2020   Lymphedema 10/11/2020   Screen for colon cancer 10/10/2020   NASH (nonalcoholic steatohepatitis) 10/10/2020   Rheumatoid arthritis ([place]) 09/26/2020   Primary hypertension 09/26/2020   Osteoarthritis  09/26/2020    Past Medical History:  Diagnosis Date   Abnormal uterine bleeding    Anemia    Blood transfusion without reported diagnosis    Diabetes mellitus without complication ([place])    Dysmenorrhea    Endometriosis    Fibroid    Genital warts    RA (rheumatoid arthritis) ([place])     Past Surgical History:  Procedure Laterality Date   ABDOMINAL HYSTERECTOMY     PARATHYROIDECTOMY      Current Outpatient Medications  Medication Sig Dispense Refill   amLODipine (NORVASC) 10 MG tablet TAKE 1 TABLET BY MOUTH DAILY 90 tablet 1   atorvastatin (LIPITOR) 10 MG tablet Take 1 tablet (10 mg total) by mouth daily. 90 tablet 3   Cholecalciferol (D3 2000 PO) Take 2 tablets by mouth.     clobetasol cream (TEMOVATE) 9.67 % Apply 1 application. topically 2 (two) times daily. 60 g 3   Cranberry 1000 MG CAPS      esomeprazole (NEXIUM) 40 MG capsule Take 40 mg by mouth every other day.     estradiol (ESTRACE) 0.1 MG/GM vaginal cream Estrogen Cream Instruction Discard applicator Apply pea sized amount to tip of finger to urethra before bed. Wash hands well after application. Use Monday, Wednesday and Friday 42.5 g 11   estradiol (VIVELLE-DOT)  0.0375 MG/24HR Place 1 patch onto the skin 2 (two) times a week. 8 patch 1   fenofibrate (TRICOR) 145 MG tablet TAKE 1 TABLET(145 MG) BY MOUTH DAILY 30 tablet 0   furosemide (LASIX) 20 MG tablet Take 20 mg by mouth as needed.     HYDROcodone-acetaminophen (NORCO/VICODIN) 5-325 MG tablet Take 1 tablet by mouth every 8 (eight) hours as needed.     leflunomide (ARAVA) 20 MG tablet Take 20 mg by mouth daily.     Lidocaine (BLUE-EMU PAIN RELIEF DRY EX) Apply topically.     losartan (COZAAR) 100 MG tablet TAKE 1 TABLET(100 MG) BY MOUTH DAILY 90 tablet 1   metoprolol succinate (TOPROL-XL) 25 MG 24 hr tablet TAKE 1 TABLET(25 MG) BY MOUTH DAILY 90 tablet 0   pregabalin (LYRICA) 150 MG capsule Take 150 mg by mouth 3 (three) times daily.     progesterone (PROMETRIUM)  100 MG capsule Take 1 capsule (100 mg total) by mouth daily. 90 capsule 3   Semaglutide, 1 MG/DOSE, 4 MG/3ML SOPN Inject 1 mg as directed once a week. 3 mL 2   SUPER B COMPLEX/C PO Take by mouth.     tiZANidine (ZANAFLEX) 2 MG tablet Take by mouth every 6 (six) hours as needed for muscle spasms.     Tocilizumab (ACTEMRA IV) Inject into the vein.     triamcinolone ointment (KENALOG) 0.1 % Apply 1 application topically 3 (three) times daily. 30 g 2   No current facility-administered medications for this visit.     ALLERGIES: Alcohol and Dilantin [phenytoin]  Family History  Problem Relation Age of Onset   Obesity Mother    Coronary artery disease Mother    Bipolar disorder Mother    Multiple sclerosis Mother    Hypertension Father    Heart attack Father    Vascular Disease Father    Breast cancer Maternal Grandmother 87    Social History   Socioeconomic History   Marital status: Married    Spouse name: Not on file   Number of children: Not on file   Years of education: Not on file   Highest education level: Not on file  Occupational History   Not on file  Tobacco Use   Smoking status: Former    Packs/day: 1.00    Years: 10.00    Total pack years: 10.00    Types: Cigarettes    Quit date: 01/14/2005    Years since quitting: 16.7   Smokeless tobacco: Never  Substance and Sexual Activity   Alcohol use: Not Currently   Drug use: Never   Sexual activity: Yes    Partners: Male    Birth control/protection: Surgical  Other Topics Concern   Not on file  Social History Narrative   Not on file   Social Determinants of Health   Financial Resource Strain: Low Risk  (12/14/2020)   Overall Financial Resource Strain (CARDIA)    Difficulty of Paying Living Expenses: Not hard at all  Food Insecurity: No Food Insecurity (05/10/2021)   Hunger Vital Sign    Worried About Running Out of Food in the Last Year: Never true    Ran Out of Food in the Last Year: Never true  Transportation  Needs: No Transportation Needs (05/10/2021)   PRAPARE - Hydrologist (Medical): No    Lack of Transportation (Non-Medical): No  Physical Activity: Sufficiently Active (12/14/2020)   Exercise Vital Sign    Days of Exercise per Week:  5 days    Minutes of Exercise per Session: 50 min  Recent Concern: Physical Activity - Insufficiently Active (11/08/2020)   Exercise Vital Sign    Days of Exercise per Week: 6 days    Minutes of Exercise per Session: 20 min  Stress: No Stress Concern Present (12/14/2020)   [place]    Feeling of Stress : Only a little  Social Connections: Moderately Isolated (12/14/2020)   Social Connection and Isolation Panel [NHANES]    Frequency of Communication with Friends and Family: More than three times a week    Frequency of Social Gatherings with Friends and Family: More than three times a week    Attends Religious Services: Never    Marine scientist or Organizations: No    Attends Archivist Meetings: Never    Marital Status: Married  Human resources officer Violence: Not At Risk (12/14/2020)   Humiliation, Afraid, Rape, and Kick questionnaire    Fear of Current or Ex-Partner: No    Emotionally Abused: No    Physically Abused: No    Sexually Abused: No    Review of Systems  All other systems reviewed and are negative.   PHYSICAL EXAMINATION:    There were no vitals taken for this visit.    General appearance: alert, cooperative and appears stated age  Pelvic: External genitalia:  no lesions              Urethra:  normal appearing urethra with no masses, tenderness or lesions              Bartholins and Skenes: normal                 Vagina: normal appearing vagina with a slight increase in yellow/white vaginal d/c              Cervix: no lesions              Chaperone was present for exam.  1. Encounter for monitoring postmenopausal estrogen  replacement therapy Doing well on current dose of ERT - estradiol (VIVELLE-DOT) 0.0375 MG/24HR; Place 1 patch onto the skin 2 (two) times a week.  Dispense: 24 patch; Refill: 2 -h/o [facility] and vaginal bleeding. Will continue on daily prometrium  2. Urinary incontinence, unspecified type - Urinalysis, Complete - Urine Culture  3. Vaginal irritation - WET PREP FOR TRICH, YEAST, CLUE: negative - SureSwab Advanced Vaginitis, TMA

## 2021-10-02 DIAGNOSIS — M47816 Spondylosis without myelopathy or radiculopathy, lumbar region: Secondary | ICD-10-CM | POA: Diagnosis not present

## 2021-10-04 DIAGNOSIS — M47816 Spondylosis without myelopathy or radiculopathy, lumbar region: Secondary | ICD-10-CM | POA: Diagnosis not present

## 2021-10-04 DIAGNOSIS — Z79891 Long term (current) use of opiate analgesic: Secondary | ICD-10-CM | POA: Diagnosis not present

## 2021-10-04 DIAGNOSIS — M170 Bilateral primary osteoarthritis of knee: Secondary | ICD-10-CM | POA: Diagnosis not present

## 2021-10-05 ENCOUNTER — Encounter: Payer: Self-pay | Admitting: Obstetrics and Gynecology

## 2021-10-05 ENCOUNTER — Ambulatory Visit (INDEPENDENT_AMBULATORY_CARE_PROVIDER_SITE_OTHER): Payer: Medicare Other | Admitting: Obstetrics and Gynecology

## 2021-10-05 VITALS — BP 108/68 | HR 71

## 2021-10-05 DIAGNOSIS — R32 Unspecified urinary incontinence: Secondary | ICD-10-CM | POA: Diagnosis not present

## 2021-10-05 DIAGNOSIS — N898 Other specified noninflammatory disorders of vagina: Secondary | ICD-10-CM | POA: Diagnosis not present

## 2021-10-05 DIAGNOSIS — Z5181 Encounter for therapeutic drug level monitoring: Secondary | ICD-10-CM

## 2021-10-05 DIAGNOSIS — Z79890 Hormone replacement therapy (postmenopausal): Secondary | ICD-10-CM | POA: Diagnosis not present

## 2021-10-05 LAB — URINALYSIS, COMPLETE
Bilirubin Urine: NEGATIVE
Casts: NONE SEEN /LPF
Crystals: NONE SEEN /HPF
Glucose, UA: NEGATIVE
Hgb urine dipstick: NEGATIVE
Hyaline Cast: NONE SEEN /LPF
Ketones, ur: NEGATIVE
Nitrite: NEGATIVE
Protein, ur: NEGATIVE
RBC / HPF: NONE SEEN /HPF (ref 0–2)
Specific Gravity, Urine: 1.02 (ref 1.001–1.035)
Yeast: NONE SEEN /HPF
pH: 5.5 (ref 5.0–8.0)

## 2021-10-05 LAB — WET PREP FOR TRICH, YEAST, CLUE

## 2021-10-05 MED ORDER — ESTRADIOL 0.0375 MG/24HR TD PTTW: 1.0000 | MEDICATED_PATCH | TRANSDERMAL | 2 refills | Status: DC

## 2021-10-06 ENCOUNTER — Other Ambulatory Visit: Payer: Self-pay | Admitting: Nurse Practitioner

## 2021-10-06 DIAGNOSIS — E669 Obesity, unspecified: Secondary | ICD-10-CM

## 2021-10-06 DIAGNOSIS — I10 Essential (primary) hypertension: Secondary | ICD-10-CM

## 2021-10-06 DIAGNOSIS — G629 Polyneuropathy, unspecified: Secondary | ICD-10-CM

## 2021-10-06 LAB — SURESWAB® ADVANCED VAGINITIS,TMA
CANDIDA SPECIES: NOT DETECTED
Candida glabrata: NOT DETECTED
SURESWAB(R) ADV BACTERIAL VAGINOSIS(BV),TMA: NEGATIVE
TRICHOMONAS VAGINALIS (TV),TMA: NOT DETECTED

## 2021-10-08 ENCOUNTER — Telehealth: Payer: Self-pay

## 2021-10-08 LAB — URINE CULTURE
MICRO NUMBER:: 13955814
SPECIMEN QUALITY:: ADEQUATE

## 2021-10-08 NOTE — Telephone Encounter (Signed)
Patient is calling office about her medication ozempic, patient needs a from from patient assistance, with correct PCP, patient has 1 mg prescibed of ozempic, please advise!

## 2021-10-09 ENCOUNTER — Other Ambulatory Visit: Payer: Self-pay

## 2021-10-09 MED ORDER — SULFAMETHOXAZOLE-TRIMETHOPRIM 800-160 MG PO TABS: 1.0000 | ORAL_TABLET | Freq: Two times a day (BID) | ORAL | 0 refills | Status: DC

## 2021-10-11 ENCOUNTER — Ambulatory Visit (INDEPENDENT_AMBULATORY_CARE_PROVIDER_SITE_OTHER): Payer: Medicare Other

## 2021-10-11 VITALS — BP 136/61 | HR 69 | Ht 64.0 in | Wt 323.0 lb

## 2021-10-11 DIAGNOSIS — Z23 Encounter for immunization: Secondary | ICD-10-CM

## 2021-10-11 NOTE — Progress Notes (Signed)
Patient is here for her Flu vaccine pt tolerated injection

## 2021-10-12 ENCOUNTER — Encounter: Payer: Self-pay | Admitting: Nurse Practitioner

## 2021-10-16 ENCOUNTER — Telehealth: Payer: Self-pay | Admitting: *Deleted

## 2021-10-16 ENCOUNTER — Other Ambulatory Visit: Payer: Self-pay | Admitting: Obstetrics and Gynecology

## 2021-10-16 ENCOUNTER — Other Ambulatory Visit: Payer: Medicare Other

## 2021-10-16 DIAGNOSIS — R32 Unspecified urinary incontinence: Secondary | ICD-10-CM

## 2021-10-16 MED ORDER — NITROFURANTOIN MONOHYD MACRO 100 MG PO CAPS: 100.0000 mg | ORAL_CAPSULE | Freq: Two times a day (BID) | ORAL | 0 refills | Status: DC

## 2021-10-16 NOTE — Telephone Encounter (Signed)
Is this paperwork you have been hanging onto for me? I am unable to locate it and I thought maybe this was in the folder you have for me?

## 2021-10-16 NOTE — Telephone Encounter (Signed)
Patient informed with below, lab appointment scheduled for today at 2pm. Orderplaced.

## 2021-10-16 NOTE — Telephone Encounter (Signed)
Patient was treated for UTI on 10/05/21 with Bactrim bid x 3 days. She finished mediation reports for the last 2 days she noticed urine leakage again and lower back discomfort. She asked if a refill of medication can be sent in or do you prefer a repeat urine? Please advise

## 2021-10-16 NOTE — Telephone Encounter (Signed)
I think she should come in for a repeat urine, it can be a lab visit.

## 2021-10-18 ENCOUNTER — Encounter: Payer: Self-pay | Admitting: Nurse Practitioner

## 2021-10-18 ENCOUNTER — Other Ambulatory Visit: Payer: Self-pay | Admitting: Nurse Practitioner

## 2021-10-18 DIAGNOSIS — M064 Inflammatory polyarthropathy: Secondary | ICD-10-CM

## 2021-10-18 MED ORDER — PREDNISONE 10 MG (21) PO TBPK: ORAL_TABLET | ORAL | 0 refills | Status: DC

## 2021-10-18 NOTE — Telephone Encounter (Signed)
I sent patient a mychart message telling her I need her to resubmit a copy of this paperwork so I can redo it and resend it.

## 2021-10-18 NOTE — Telephone Encounter (Signed)
Ok thanks 

## 2021-10-18 NOTE — Progress Notes (Signed)
Sent prednisone taper. Take as directed for 6 days when needed for flare of inflammatory polyarthropathy.

## 2021-10-23 ENCOUNTER — Encounter: Payer: Self-pay | Admitting: Nurse Practitioner

## 2021-10-23 ENCOUNTER — Ambulatory Visit (INDEPENDENT_AMBULATORY_CARE_PROVIDER_SITE_OTHER): Payer: Medicare Other | Admitting: Nurse Practitioner

## 2021-10-23 VITALS — BP 133/79 | HR 63 | Ht 64.0 in | Wt 324.1 lb

## 2021-10-23 DIAGNOSIS — I10 Essential (primary) hypertension: Secondary | ICD-10-CM | POA: Diagnosis not present

## 2021-10-23 DIAGNOSIS — E538 Deficiency of other specified B group vitamins: Secondary | ICD-10-CM | POA: Diagnosis not present

## 2021-10-23 DIAGNOSIS — Z794 Long term (current) use of insulin: Secondary | ICD-10-CM | POA: Diagnosis not present

## 2021-10-23 DIAGNOSIS — E119 Type 2 diabetes mellitus without complications: Secondary | ICD-10-CM | POA: Diagnosis not present

## 2021-10-23 DIAGNOSIS — G4730 Sleep apnea, unspecified: Secondary | ICD-10-CM

## 2021-10-23 LAB — POCT GLYCOSYLATED HEMOGLOBIN (HGB A1C): Hemoglobin A1C: 5.6 % (ref 4.0–5.6)

## 2021-10-23 MED ORDER — CYANOCOBALAMIN 1000 MCG/ML IJ SOLN
1000.0000 ug | Freq: Once | INTRAMUSCULAR | Status: AC
  Administered 2021-10-23: 1000 ug via INTRAMUSCULAR

## 2021-10-23 MED ORDER — CYANOCOBALAMIN 1000 MCG/ML IJ SOLN: 1000.0000 ug | INTRAMUSCULAR | 1 refills | Status: DC

## 2021-10-23 MED ORDER — "INSULIN SYRINGE 29G X 1/2"" 0.3 ML MISC": 1 refills | Status: DC

## 2021-10-23 NOTE — Progress Notes (Signed)
Established patient visit   Patient: Elizabeth Patrick   DOB: 21-Oct-1970   51 y.o. Female  MRN: [id number] Visit Date: 10/23/2021   Chief Complaint  Patient presents with   Follow-up   Diabetes   Subjective    HPI  Follow up -taking Ozempic 1 mg weekly  -HgbA1c 5.6 today.  -blood pressure well managed . -will discontinue amlodipine altogether to see how it reacts. Amlodipine has been causing ankle swelling.  -patient would like referral to counseling services.  -due for B12 injection. Has not helped with fatigue or metabolism but has helped with her neuropathy.  -sleep studies have shown severe sleep apnea. Requires BiPap. Is waiting on the machine to come so she can use it.    Medications: Outpatient Medications Prior to Visit  Medication Sig   amLODipine (NORVASC) 10 MG tablet TAKE 1 TABLET BY MOUTH DAILY   atorvastatin (LIPITOR) 10 MG tablet Take 1 tablet (10 mg total) by mouth daily.   Cholecalciferol (D3 2000 PO) Take 2 tablets by mouth.   clobetasol cream (TEMOVATE) 6.75 % Apply 1 application. topically 2 (two) times daily.   Cranberry 1000 MG CAPS    esomeprazole (NEXIUM) 40 MG capsule Take 40 mg by mouth every other day.   estradiol (ESTRACE) 0.1 MG/GM vaginal cream Estrogen Cream Instruction Discard applicator Apply pea sized amount to tip of finger to urethra before bed. Wash hands well after application. Use Monday, Wednesday and Friday   estradiol (VIVELLE-DOT) 0.0375 MG/24HR Place 1 patch onto the skin 2 (two) times a week.   fenofibrate (TRICOR) 145 MG tablet TAKE 1 TABLET(145 MG) BY MOUTH DAILY   furosemide (LASIX) 20 MG tablet Take 20 mg by mouth as needed.   HYDROcodone-acetaminophen (NORCO/VICODIN) 5-325 MG tablet Take 1 tablet by mouth every 8 (eight) hours as needed.   ketoconazole (NIZORAL) 2 % cream Apply topically 2 (two) times daily.   leflunomide (ARAVA) 20 MG tablet Take 20 mg by mouth daily.   Lidocaine (BLUE-EMU PAIN RELIEF DRY EX) Apply topically.    losartan (COZAAR) 100 MG tablet TAKE 1 TABLET(100 MG) BY MOUTH DAILY   metoprolol succinate (TOPROL-XL) 25 MG 24 hr tablet TAKE 1 TABLET(25 MG) BY MOUTH DAILY   predniSONE (STERAPRED UNI-PAK 21 TAB) 10 MG (21) TBPK tablet 6 day taper - take by mouth as directed for 6 days   pregabalin (LYRICA) 150 MG capsule 1 capsule Orally Three times a day for 30 days   progesterone (PROMETRIUM) 100 MG capsule Take 1 capsule (100 mg total) by mouth daily.   Semaglutide, 1 MG/DOSE, 4 MG/3ML SOPN Inject 1 mg as directed once a week.   SUPER B COMPLEX/C PO Take by mouth.   tiZANidine (ZANAFLEX) 2 MG tablet Take by mouth every 6 (six) hours as needed for muscle spasms.   Tocilizumab (ACTEMRA IV) Inject into the vein.   [DISCONTINUED] nitrofurantoin, macrocrystal-monohydrate, (MACROBID) 100 MG capsule Take 1 capsule (100 mg total) by mouth 2 (two) times daily.   [DISCONTINUED] sulfamethoxazole-trimethoprim (BACTRIM DS) 800-160 MG tablet Take 1 tablet by mouth 2 (two) times daily. X 3days.   [DISCONTINUED] triamcinolone ointment (KENALOG) 0.1 % Apply 1 application topically 3 (three) times daily.   No facility-administered medications prior to visit.    Review of Systems  Constitutional:  Positive for fatigue. Negative for activity change, appetite change, chills and fever.  HENT:  Negative for congestion, postnasal drip, rhinorrhea, sinus pressure, sinus pain, sneezing and sore throat.   Eyes: Negative.  Respiratory:  Negative for cough, chest tightness, shortness of breath and wheezing.   Cardiovascular:  Positive for leg swelling. Negative for chest pain and palpitations.  Gastrointestinal:  Negative for abdominal pain, constipation, diarrhea, nausea and vomiting.  Endocrine: Negative for cold intolerance, heat intolerance, polydipsia and polyuria.       Blood sugars doing well    Genitourinary:  Negative for dyspareunia, dysuria, flank pain, frequency and urgency.  Musculoskeletal:  Negative for  arthralgias, back pain and myalgias.  Skin:  Negative for rash.  Allergic/Immunologic: Negative for environmental allergies.  Neurological:  Negative for dizziness, weakness and headaches.  Hematological:  Negative for adenopathy.  Psychiatric/Behavioral:  Positive for dysphoric mood. The patient is nervous/anxious.        Objective     Today's Vitals   10/23/21 1003  BP: 133/79  Pulse: 63  SpO2: 96%  Weight: (Abnormal) 324 lb 1.9 oz (147 kg)  Height: 5' 4"  (1.626 m)   Body mass index is 55.64 kg/m.  BP Readings from Last 3 Encounters:  10/23/21 133/79  10/11/21 136/61  10/05/21 108/68    Wt Readings from Last 3 Encounters:  10/23/21 (Abnormal) 324 lb 1.9 oz (147 kg)  10/11/21 (Abnormal) 323 lb (146.5 kg)  08/31/21 (Abnormal) 322 lb 6.4 oz (146.2 kg)    Physical Exam Vitals and nursing note reviewed.  Constitutional:      Appearance: Normal appearance. She is well-developed. She is obese.  HENT:     Head: Normocephalic and atraumatic.     Nose: Nose normal.     Mouth/Throat:     Mouth: Mucous membranes are moist.     Pharynx: Oropharynx is clear.  Eyes:     Extraocular Movements: Extraocular movements intact.     Conjunctiva/sclera: Conjunctivae normal.     Pupils: Pupils are equal, round, and reactive to light.  Cardiovascular:     Rate and Rhythm: Normal rate and regular rhythm.     Pulses: Normal pulses.     Heart sounds: Normal heart sounds.  Pulmonary:     Effort: Pulmonary effort is normal.     Breath sounds: Normal breath sounds.  Abdominal:     Palpations: Abdomen is soft.  Musculoskeletal:        General: Normal range of motion.     Cervical back: Normal range of motion and neck supple.  Lymphadenopathy:     Cervical: No cervical adenopathy.  Skin:    General: Skin is warm and dry.     Capillary Refill: Capillary refill takes less than 2 seconds.  Neurological:     General: No focal deficit present.     Mental Status: She is alert and  oriented to person, place, and time.  Psychiatric:        Mood and Affect: Mood normal.        Behavior: Behavior normal.        Thought Content: Thought content normal.        Judgment: Judgment normal.      Results for orders placed or performed in visit on 10/23/21  POCT glycosylated hemoglobin (Hb A1C)  Result Value Ref Range   Hemoglobin A1C 5.6 4.0 - 5.6 %   HbA1c POC (<> result, manual entry)     HbA1c, POC (prediabetic range)     HbA1c, POC (controlled diabetic range)      Assessment & Plan    1. Type 2 diabetes mellitus without complication, with long-term current use of insulin (HCC) HgbA1c 5.6  today. Continue ozempic as previously prescribed. Recheck HgbA1c in three months  - POCT glycosylated hemoglobin (Hb A1C)  2. Vitamin B12 deficiency Vitamin b12 injection administered during today's visit prescription given for patient to self administer b12 injections monthly  - cyanocobalamin (VITAMIN B12) 1000 MCG/ML injection; Inject 1 mL (1,000 mcg total) into the muscle every 30 (thirty) days.  Dispense: 10 mL; Refill: 1 - Insulin Syringe-Needle U-100 (INSULIN SYRINGE .3CC/29GX1/2") 29G X 1/2" 0.3 ML MISC; To use once monthly for vitamin b12 injections  Dispense: 30 each; Refill: 1 - cyanocobalamin (VITAMIN B12) injection 1,000 mcg  3. Morbid obesity ([place]) Discussed lowering calorie intake to 1500 calories per day and incorporating exercise into daily routine to help lose weight.  4. Primary hypertension Stable. Continue bp medication as prescribed   5. Severe sleep apnea Patient awaiting treatment with BiPap machine. Followed by sleep medicine     Problem List Items Addressed This Visit       Cardiovascular and Mediastinum   Primary hypertension     Respiratory   Severe sleep apnea     Endocrine   Type 2 diabetes mellitus without complication, with long-term current use of insulin (HCC) - Primary   Relevant Orders   POCT glycosylated hemoglobin (Hb A1C)  (Completed)     Other   Morbid obesity (HCC)   Vitamin B12 deficiency   Relevant Medications   cyanocobalamin (VITAMIN B12) 1000 MCG/ML injection   Insulin Syringe-Needle U-100 (INSULIN SYRINGE .3CC/29GX1/2") 29G X 1/2" 0.3 ML MISC     Return in about 3 months (around 01/23/2022) for diabetes with HgbA1c check. - shingles vaccine .         Ronnell Freshwater, NP  [facility] at [facility] [phone] (phone) [phone] (fax)  [place]

## 2021-10-24 ENCOUNTER — Encounter: Payer: Self-pay | Admitting: Podiatry

## 2021-10-24 NOTE — Telephone Encounter (Signed)
I ordered juxta lite compression socks through [employer]. Can someone please contact them to see if they have the order? Thanks!

## 2021-10-26 DIAGNOSIS — R5383 Other fatigue: Secondary | ICD-10-CM | POA: Diagnosis not present

## 2021-10-26 DIAGNOSIS — M0609 Rheumatoid arthritis without rheumatoid factor, multiple sites: Secondary | ICD-10-CM | POA: Diagnosis not present

## 2021-10-26 DIAGNOSIS — Z111 Encounter for screening for respiratory tuberculosis: Secondary | ICD-10-CM | POA: Diagnosis not present

## 2021-10-26 DIAGNOSIS — Z79899 Other long term (current) drug therapy: Secondary | ICD-10-CM | POA: Diagnosis not present

## 2021-11-01 ENCOUNTER — Ambulatory Visit: Payer: Medicare Other | Admitting: Physician Assistant

## 2021-11-06 ENCOUNTER — Telehealth: Payer: Self-pay

## 2021-11-06 NOTE — Telephone Encounter (Signed)
3 boxes of Ozempic received for the patient. Tried calling to notify her, no answer and no VM. Will try to call again.

## 2021-11-08 LAB — URINALYSIS, COMPLETE W/RFL CULTURE
Glucose, UA: NEGATIVE
Hgb urine dipstick: NEGATIVE
Hyaline Cast: NONE SEEN /LPF
Nitrites, Initial: NEGATIVE
RBC / HPF: NONE SEEN /HPF (ref 0–2)
Specific Gravity, Urine: 1.025 (ref 1.001–1.035)
pH: 5.5 (ref 5.0–8.0)

## 2021-11-08 LAB — URINE CULTURE
MICRO NUMBER:: 14000584
SPECIMEN QUALITY:: ADEQUATE

## 2021-11-08 LAB — CULTURE INDICATED

## 2021-11-08 NOTE — Telephone Encounter (Signed)
Called and notified patient that medication was ready to be picked up. Patient states she will be by tomorrow to pick it up.

## 2021-11-09 NOTE — Telephone Encounter (Signed)
Medication picked up by the patient.

## 2021-11-11 ENCOUNTER — Other Ambulatory Visit: Payer: Self-pay | Admitting: Nurse Practitioner

## 2021-11-11 DIAGNOSIS — G629 Polyneuropathy, unspecified: Secondary | ICD-10-CM

## 2021-11-11 DIAGNOSIS — I10 Essential (primary) hypertension: Secondary | ICD-10-CM

## 2021-11-11 DIAGNOSIS — E669 Obesity, unspecified: Secondary | ICD-10-CM

## 2021-11-12 ENCOUNTER — Encounter (INDEPENDENT_AMBULATORY_CARE_PROVIDER_SITE_OTHER): Payer: Self-pay

## 2021-11-23 DIAGNOSIS — M0609 Rheumatoid arthritis without rheumatoid factor, multiple sites: Secondary | ICD-10-CM | POA: Diagnosis not present

## 2021-11-26 ENCOUNTER — Telehealth: Payer: Self-pay | Admitting: Gastroenterology

## 2021-11-26 NOTE — Telephone Encounter (Signed)
All pts medical records were sent to [facility]  [place] [address] [place] [id number]

## 2021-11-29 ENCOUNTER — Encounter: Payer: Self-pay | Admitting: Nurse Practitioner

## 2021-11-29 ENCOUNTER — Ambulatory Visit (INDEPENDENT_AMBULATORY_CARE_PROVIDER_SITE_OTHER): Payer: Medicare Other | Admitting: Nurse Practitioner

## 2021-11-29 VITALS — BP 123/76 | HR 69 | Ht 64.0 in | Wt 327.0 lb

## 2021-11-29 DIAGNOSIS — G629 Polyneuropathy, unspecified: Secondary | ICD-10-CM

## 2021-11-29 DIAGNOSIS — E669 Obesity, unspecified: Secondary | ICD-10-CM

## 2021-11-29 DIAGNOSIS — B001 Herpesviral vesicular dermatitis: Secondary | ICD-10-CM

## 2021-11-29 DIAGNOSIS — M5137 Other intervertebral disc degeneration, lumbosacral region: Secondary | ICD-10-CM

## 2021-11-29 DIAGNOSIS — M25551 Pain in right hip: Secondary | ICD-10-CM | POA: Diagnosis not present

## 2021-11-29 DIAGNOSIS — I10 Essential (primary) hypertension: Secondary | ICD-10-CM | POA: Diagnosis not present

## 2021-11-29 DIAGNOSIS — M51379 Other intervertebral disc degeneration, lumbosacral region without mention of lumbar back pain or lower extremity pain: Secondary | ICD-10-CM

## 2021-11-29 MED ORDER — ACYCLOVIR 400 MG PO TABS: 400.0000 mg | ORAL_TABLET | Freq: Two times a day (BID) | ORAL | 3 refills | Status: DC

## 2021-11-29 MED ORDER — FENOFIBRATE 145 MG PO TABS: ORAL_TABLET | ORAL | 1 refills | Status: DC

## 2021-11-29 NOTE — Progress Notes (Signed)
Established patient visit   Patient: Elizabeth Patrick   DOB: December 20, 1970   51 y.o. Female  MRN: [id number] Visit Date: 11/29/2021  Chief Complaint  Patient presents with   Leg Pain   Subjective    Recently had spinal nerve ablation. Unsure of which levels.  -she does have degenerative disc disease.  -this right leg pain started after she had this ablation.  -she does see pain management provider.  --they did the lumbar ablation -given new prescription for vicodin, up to three times daily.  -takes lyrica and tizanidine as well.   Leg Pain  The incident occurred more than 1 week ago. The incident occurred at the gym. There was no injury mechanism. The pain is present in the right hip, right thigh, right knee, right leg and right ankle. The quality of the pain is described as shooting. The pain is moderate. The pain has been Intermittent since onset. Associated symptoms include an inability to bear weight, a loss of motion, muscle weakness and tingling. She reports no foreign bodies present. The symptoms are aggravated by movement and weight bearing. She has tried heat and non-weight bearing for the symptoms. The treatment provided mild relief.      Medications: Outpatient Medications Prior to Visit  Medication Sig   amLODipine (NORVASC) 10 MG tablet TAKE 1 TABLET BY MOUTH DAILY   atorvastatin (LIPITOR) 10 MG tablet Take 1 tablet (10 mg total) by mouth daily.   Cholecalciferol (D3 2000 PO) Take 2 tablets by mouth.   clobetasol cream (TEMOVATE) 0.05 % Apply 1 application. topically 2 (two) times daily.   Cranberry 1000 MG CAPS    cyanocobalamin (VITAMIN B12) 1000 MCG/ML injection Inject 1 mL (1,000 mcg total) into the muscle every 30 (thirty) days.   esomeprazole (NEXIUM) 40 MG capsule Take 40 mg by mouth every other day.   estradiol (ESTRACE) 0.1 MG/GM vaginal cream Estrogen Cream Instruction Discard applicator Apply pea sized amount to tip of finger to urethra before bed. Wash hands  well after application. Use Monday, Wednesday and Friday   estradiol (VIVELLE-DOT) 0.0375 MG/24HR Place 1 patch onto the skin 2 (two) times a week.   furosemide (LASIX) 20 MG tablet Take 20 mg by mouth as needed.   HYDROcodone-acetaminophen (NORCO/VICODIN) 5-325 MG tablet Take 1 tablet by mouth every 8 (eight) hours as needed.   Insulin Syringe-Needle U-100 (INSULIN SYRINGE .3CC/29GX1/2") 29G X 1/2" 0.3 ML MISC To use once monthly for vitamin b12 injections   ketoconazole (NIZORAL) 2 % cream Apply topically 2 (two) times daily.   Lidocaine (BLUE-EMU PAIN RELIEF DRY EX) Apply topically.   losartan (COZAAR) 100 MG tablet TAKE 1 TABLET(100 MG) BY MOUTH DAILY   metoprolol succinate (TOPROL-XL) 25 MG 24 hr tablet TAKE 1 TABLET(25 MG) BY MOUTH DAILY   predniSONE (STERAPRED UNI-PAK 21 TAB) 10 MG (21) TBPK tablet 6 day taper - take by mouth as directed for 6 days   pregabalin (LYRICA) 150 MG capsule 1 capsule Orally Three times a day for 30 days   progesterone (PROMETRIUM) 100 MG capsule Take 1 capsule (100 mg total) by mouth daily.   Semaglutide, 1 MG/DOSE, 4 MG/3ML SOPN Inject 1 mg as directed once a week.   SUPER B COMPLEX/C PO Take by mouth.   tiZANidine (ZANAFLEX) 2 MG tablet Take by mouth every 6 (six) hours as needed for muscle spasms.   Tocilizumab (ACTEMRA IV) Inject into the vein.   [DISCONTINUED] fenofibrate (TRICOR) 145 MG tablet TAKE 1 TABLET(145 MG)  BY MOUTH DAILY   [DISCONTINUED] leflunomide (ARAVA) 20 MG tablet Take 20 mg by mouth daily.   No facility-administered medications prior to visit.    Review of Systems  Neurological:  Positive for tingling.     Objective     Today's Vitals   11/29/21 1044 11/29/21 1057  BP: (Abnormal) 155/54 123/76  Pulse: 69   SpO2: 97%   Weight: (Abnormal) 327 lb (148.3 kg)   Height: 5' 4"  (1.626 m)    Body mass index is 56.13 kg/m.   Physical Exam Vitals and nursing note reviewed.  Constitutional:      Appearance: Normal appearance. She  is well-developed. She is obese.  HENT:     Head: Normocephalic and atraumatic.  Eyes:     Pupils: Pupils are equal, round, and reactive to light.  Cardiovascular:     Rate and Rhythm: Normal rate and regular rhythm.     Pulses: Normal pulses.     Heart sounds: Normal heart sounds.  Pulmonary:     Effort: Pulmonary effort is normal.     Breath sounds: Normal breath sounds.  Abdominal:     Palpations: Abdomen is soft.  Musculoskeletal:        General: Normal range of motion.     Cervical back: Normal range of motion and neck supple.     Comments: Moderate back pain with pain in right hip and buttock. No bony abnormalities  or deformities noted at this time. Patient needs to use cane to help with ambulation and balance.   Lymphadenopathy:     Cervical: No cervical adenopathy.  Skin:    General: Skin is warm and dry.     Capillary Refill: Capillary refill takes less than 2 seconds.  Neurological:     General: No focal deficit present.     Mental Status: She is alert and oriented to person, place, and time.  Psychiatric:        Mood and Affect: Mood is anxious.        Speech: Speech normal.        Behavior: Behavior normal. Behavior is cooperative.        Thought Content: Thought content normal.        Cognition and Memory: Cognition and memory normal.        Judgment: Judgment normal.       Assessment & Plan    1. Right hip pain Likely sciatic nerve pain. Will get x-rays of lumbar spine and right hip for further evaluation. She does have pain management provider.  Results to be forwarded when available.  - DG Lumbar Spine 2-3 Views; Future - DG Hip Unilat W OR W/O Pelvis 2-3 Views Right; Future  2. DDD (degenerative disc disease), lumbosacral Likely sciatic nerve pain. Will get x-rays of lumbar spine and right hip for further evaluation. She does have pain management provider.  Results to be forwarded when available.  - DG Lumbar Spine 2-3 Views; Future - DG Hip Unilat W OR  W/O Pelvis 2-3 Views Right; Future  3. Recurrent cold sores Start acyclovir 400 ,mg twice daily for 5 days as needed. Start as soon as first sensation of cold sore develops.  - acyclovir (ZOVIRAX) 400 MG tablet; Take 1 tablet (400 mg total) by mouth 2 (two) times daily.  Dispense: 60 tablet; Refill: 3  4. Primary hypertension Continue all bp medication as prescribed. Start fenofibrate 145 mg every evening.  - fenofibrate (TRICOR) 145 MG tablet; Take 1 tablet po  QD with dinner  Dispense: 90 tablet; Refill: 1  5. Obesity (BMI 35.0-39.9 without comorbidity) Discussed lowering calorie intake to 1500 calories per day and incorporating exercise into daily routine to help lose weight.   Return in about 3 months (around 03/01/2022) for diabetes with HgbA1c check.        Ronnell Freshwater, NP  [facility] Primary Care at [facility] [phone] (phone) [phone] (fax)  [place]

## 2021-12-15 DIAGNOSIS — G8929 Other chronic pain: Secondary | ICD-10-CM | POA: Insufficient documentation

## 2021-12-15 DIAGNOSIS — M5137 Other intervertebral disc degeneration, lumbosacral region: Secondary | ICD-10-CM | POA: Insufficient documentation

## 2021-12-15 DIAGNOSIS — E669 Obesity, unspecified: Secondary | ICD-10-CM | POA: Insufficient documentation

## 2021-12-15 DIAGNOSIS — B001 Herpesviral vesicular dermatitis: Secondary | ICD-10-CM | POA: Insufficient documentation

## 2021-12-15 DIAGNOSIS — M51379 Other intervertebral disc degeneration, lumbosacral region without mention of lumbar back pain or lower extremity pain: Secondary | ICD-10-CM | POA: Insufficient documentation

## 2021-12-15 DIAGNOSIS — M25551 Pain in right hip: Secondary | ICD-10-CM | POA: Insufficient documentation

## 2021-12-17 ENCOUNTER — Encounter: Payer: Self-pay | Admitting: Gastroenterology

## 2021-12-17 ENCOUNTER — Other Ambulatory Visit: Payer: Self-pay

## 2021-12-17 ENCOUNTER — Ambulatory Visit: Payer: Medicare Other | Admitting: Gastroenterology

## 2021-12-17 VITALS — BP 150/90 | HR 66 | Temp 98.3°F

## 2021-12-17 DIAGNOSIS — I10 Essential (primary) hypertension: Secondary | ICD-10-CM | POA: Diagnosis not present

## 2021-12-17 DIAGNOSIS — R140 Abdominal distension (gaseous): Secondary | ICD-10-CM | POA: Diagnosis not present

## 2021-12-17 DIAGNOSIS — E669 Obesity, unspecified: Secondary | ICD-10-CM

## 2021-12-17 DIAGNOSIS — G629 Polyneuropathy, unspecified: Secondary | ICD-10-CM

## 2021-12-17 DIAGNOSIS — K219 Gastro-esophageal reflux disease without esophagitis: Secondary | ICD-10-CM

## 2021-12-17 MED ORDER — ESOMEPRAZOLE MAGNESIUM 40 MG PO CPDR: 40.0000 mg | DELAYED_RELEASE_CAPSULE | Freq: Two times a day (BID) | ORAL | 0 refills | Status: DC

## 2021-12-17 NOTE — Progress Notes (Signed)
Elizabeth Bellows MD, MRCP(U.K) [address]  [place]  [place], [place] [address]  Main: [phone]  Fax: [phone]   Primary Care Physician: Ronnell Freshwater, NP  Primary Gastroenterologist:  Dr. Jonathon Patrick   Chief Complaint  Patient presents with   Gastroesophageal Reflux    HPI: Elizabeth Patrick is a 51 y.o. 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  In 2021 when the patient lived in [place] was found to have elevated liver function tests while on methotrexate for rheumatoid arthritis and underwent a liver biopsy which showed features of [name] but no evidence of cirrhosis.  No fibrosis noted either.  Subsequently lost weight intentionally.  No alcohol use.  History of hyperlipidemia, obesity, hypertension.   12/12/2020 [name] FibroSure test showed a fibrosis score of 0.4 which is an F1 F2 fibrosis and a Nash score of 0.75 indicating NASH and steatosis of score of 0.43 indicating minimal steatosis: 12/21/2020: Right upper quadrant ultrasound shows hepatic steatosis   12/12/2020 indicated not immune to hepatitis A and B and will need vaccination.  Ceruloplasmin levels were low recommended rechecking  04/11/2021: Ceruloplasmin 12.1 05/10/2021 urinary copper excretion within normal limits 07/23/2021 hemoglobin 16.2, ferritin 176, B12 folate normal      Interval history   08/13/2021-12/17/2021   She says that she has lost 80 pounds since she started Ozempic and the reason she is here today to see me is because she has been having a sense of bloating and belching foul material feels that food sits in her stomach has regurgitation often taking a low-dose Nexium every other day with her coffee in the morning inadequate response.   Current Outpatient Medications  Medication Sig Dispense Refill   Acetaminophen-Codeine (TYLENOL WITH CODEINE #3 PO) Tylenol with Codeine #3     acyclovir (ZOVIRAX) 400 MG tablet Take 1 tablet (400 mg  total) by mouth 2 (two) times daily. 60 tablet 3   amLODipine (NORVASC) 10 MG tablet TAKE 1 TABLET BY MOUTH DAILY 90 tablet 1   atorvastatin (LIPITOR) 10 MG tablet Take 1 tablet (10 mg total) by mouth daily. 90 tablet 3   B Complex-C (SUPER B-C PO) Super B-C     Cholecalciferol (D3 2000 PO) Take 2 tablets by mouth.     Cholecalciferol 1.25 MG (50000 UT) capsule Take 50,000 Units by mouth daily.     clobetasol cream (TEMOVATE) 8.27 % Apply 1 application. topically 2 (two) times daily. 60 g 3   Cranberry 1000 MG CAPS      cyanocobalamin (VITAMIN B12) 1000 MCG/ML injection Inject 1 mL (1,000 mcg total) into the muscle every 30 (thirty) days. 10 mL 1   empagliflozin (JARDIANCE) 10 MG TABS tablet Take 10 mg by mouth daily.     esomeprazole (NEXIUM) 40 MG capsule Take 40 mg by mouth every other day.     estradiol (ESTRACE) 0.1 MG/GM vaginal cream Estrogen Cream Instruction Discard applicator Apply pea sized amount to tip of finger to urethra before bed. Wash hands well after application. Use Monday, Wednesday and Friday 42.5 g 11   estradiol (VIVELLE-DOT) 0.0375 MG/24HR Place 1 patch onto the skin 2 (two) times a week. 24 patch 2   fenofibrate (TRICOR) 145 MG tablet Take 1 tablet po QD with dinner 90 tablet 1   furosemide (LASIX) 20 MG tablet Take 20 mg by mouth as needed.     gabapentin (NEURONTIN) 800 MG tablet Take 800  mg by mouth in the morning, at noon, in the evening, and at bedtime.     HYDROcodone-acetaminophen (NORCO/VICODIN) 5-325 MG tablet Take 1 tablet by mouth every 8 (eight) hours as needed.     ibuprofen (ADVIL) 600 MG tablet Take 600 mg by mouth 3 (three) times daily as needed.     Insulin Syringe-Needle U-100 (INSULIN SYRINGE .3CC/29GX1/2") 29G X 1/2" 0.3 ML MISC To use once monthly for vitamin b12 injections 30 each 1   ketoconazole (NIZORAL) 2 % cream Apply topically 2 (two) times daily.     Lidocaine (BLUE-EMU PAIN RELIEF DRY EX) Apply topically.     losartan (COZAAR) 100 MG tablet  TAKE 1 TABLET(100 MG) BY MOUTH DAILY 90 tablet 1   metoprolol tartrate (LOPRESSOR) 25 MG tablet Take 25 mg by mouth.     predniSONE (STERAPRED UNI-PAK 21 TAB) 10 MG (21) TBPK tablet 6 day taper - take by mouth as directed for 6 days 21 tablet 0   pregabalin (LYRICA) 150 MG capsule 1 capsule Orally Three times a day for 30 days     progesterone (PROMETRIUM) 100 MG capsule Take 1 capsule (100 mg total) by mouth daily. 90 capsule 3   Semaglutide, 1 MG/DOSE, 4 MG/3ML SOPN Inject 1 mg as directed once a week. 3 mL 2   tiZANidine (ZANAFLEX) 2 MG tablet Take by mouth every 6 (six) hours as needed for muscle spasms.     Tocilizumab (ACTEMRA IV) Inject into the vein.     No current facility-administered medications for this visit.    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 ENT: Negative for hoarseness, difficulty swallowing , nasal congestion. CV: Negative for chest pain, angina, palpitations, dyspnea on exertion, peripheral edema.  Respiratory: Negative for dyspnea at rest, dyspnea on exertion, cough, sputum, wheezing.  GI: See history of present illness. GU:  Negative for dysuria, hematuria, urinary incontinence, urinary frequency, nocturnal urination.  Endo: Negative for unusual weight change.    Physical Examination:   BP (!) 150/90   Pulse 66   Temp 98.3 F (36.8 C) (Oral)   General: Well-nourished, well-developed in no acute distress.  Eyes: No icterus. Conjunctivae pink. Mouth: Oropharyngeal mucosa moist and pink , no lesions erythema or exudate. Neuro: Alert and oriented x 3.  Grossly intact. Skin: Warm and dry, no jaundice.   Psych: Alert and cooperative, normal mood and affect.   Imaging Studies: No results found.  Assessment and Plan:   Elizabeth Patrick is a 51  y.o. y/o female  here to follow-up for NASH.  Right upper quadrant ultrasound shows hepatic steatosis.  [name] FibroSure test indicates [place] and F1 F2 fibrosis ,  she has features suggestive of metabolic syndrome.  By her liver biopsy in 2021 shows NASH with no cirrhotic findings or fibrosis.  Incidental finding of low ceruloplasmin levels.  24-hour urinary copper levels were normal.  She states she had ophthalmology evaluation and she was negative for any KF rings.  Presently here to see me for upper GI symptoms very likely due to gastroparesis from [place]  Gastroparesis diet information provided Increase Nexium to 40 mg twice daily as a trial for 8 weeks. At next visit if no better we will consider upper endoscopy to rule out gastric outlet obstruction or  gastric bezoar    Dr Elizabeth Bellows  MD,MRCP [facility]) Follow up in 2 weeks video visit

## 2021-12-21 ENCOUNTER — Other Ambulatory Visit: Payer: Self-pay

## 2021-12-21 DIAGNOSIS — M0609 Rheumatoid arthritis without rheumatoid factor, multiple sites: Secondary | ICD-10-CM | POA: Diagnosis not present

## 2021-12-21 DIAGNOSIS — Z79899 Other long term (current) drug therapy: Secondary | ICD-10-CM | POA: Diagnosis not present

## 2021-12-21 MED ORDER — OMEPRAZOLE 40 MG PO CPDR: 40.0000 mg | DELAYED_RELEASE_CAPSULE | Freq: Two times a day (BID) | ORAL | 0 refills | Status: DC

## 2021-12-25 ENCOUNTER — Telehealth: Payer: Self-pay | Admitting: *Deleted

## 2021-12-25 NOTE — Telephone Encounter (Signed)
Pt in office today to explain what needs to be done with paper work to change the provider for her [place] and also dropped off Handicap Placard Renewal to be completed.    Pt said we need to complete pages 4 and 5  and send a new Rx with those forms and fax back to [phone]. She said she is currently on the 5m dose and when her dose changes we will need to resend the new Rx for each dose change. She said when you fax this back [facility]  Patient would like to have the complete packet scanned into her chart and would like a copy made for her. Elizabeth Patrick, CMA

## 2021-12-26 DIAGNOSIS — M170 Bilateral primary osteoarthritis of knee: Secondary | ICD-10-CM | POA: Diagnosis not present

## 2021-12-26 DIAGNOSIS — M47816 Spondylosis without myelopathy or radiculopathy, lumbar region: Secondary | ICD-10-CM | POA: Diagnosis not present

## 2021-12-26 DIAGNOSIS — Z79891 Long term (current) use of opiate analgesic: Secondary | ICD-10-CM | POA: Diagnosis not present

## 2021-12-27 NOTE — Telephone Encounter (Signed)
Forms given to provider for review on 12/25/21

## 2021-12-28 ENCOUNTER — Encounter: Payer: Self-pay | Admitting: Nurse Practitioner

## 2021-12-28 NOTE — Telephone Encounter (Signed)
This has been completed and faxed. Patient to pick up handicap application next week.

## 2021-12-28 NOTE — Telephone Encounter (Signed)
I called pt to let her know that the pw was faxed and confirmation received and that she can come pick up copy for her record and that Handicap placard application is also ready for pick up.  Pt states she will come by next week to pick up.  FYI

## 2021-12-28 NOTE — Telephone Encounter (Signed)
Nexium, protonix and prilosec are all very similar - am ok with any othese - would want you to take any of these 40 mg BID, Elizabeth Patrick can we check if she has the right prescription , dose and number of pills. If none of these work will need to try her on Dexilant via insurance

## 2021-12-28 NOTE — Telephone Encounter (Signed)
Great. Thank you.

## 2021-12-31 ENCOUNTER — Other Ambulatory Visit: Payer: Self-pay

## 2021-12-31 NOTE — Telephone Encounter (Signed)
Order nexium 40 mg a day , if she doesn't respond in a week we can tell her to take one in am and one in pm

## 2022-01-02 ENCOUNTER — Other Ambulatory Visit: Payer: Self-pay

## 2022-01-08 DIAGNOSIS — Z79891 Long term (current) use of opiate analgesic: Secondary | ICD-10-CM | POA: Diagnosis not present

## 2022-01-08 DIAGNOSIS — M170 Bilateral primary osteoarthritis of knee: Secondary | ICD-10-CM | POA: Diagnosis not present

## 2022-01-08 DIAGNOSIS — M47816 Spondylosis without myelopathy or radiculopathy, lumbar region: Secondary | ICD-10-CM | POA: Diagnosis not present

## 2022-01-15 ENCOUNTER — Telehealth: Payer: Self-pay | Admitting: Gastroenterology

## 2022-01-15 DIAGNOSIS — Z91199 Patient's noncompliance with other medical treatment and regimen due to unspecified reason: Secondary | ICD-10-CM

## 2022-01-15 NOTE — Progress Notes (Signed)
No show

## 2022-01-15 NOTE — Telephone Encounter (Signed)
Hey Elizabeth Patrick.  We did this paperwork already. I did it as soon as I got it. Up above, you said that confirmation was received that they got the paperwork via fax. Can we call them to see what the hold up is?

## 2022-01-15 NOTE — Telephone Encounter (Signed)
I called [facility] said it will be a 10 min wait. Called pt she is advised of pw has been faxed on 12/28/2021. Patient stated that she called [employer] and was on hold for 45 min and no one responded. She also stated that she went back to '5mg'$  this is her 3rd week and she would like to know what do you want her to do.

## 2022-01-18 DIAGNOSIS — M0609 Rheumatoid arthritis without rheumatoid factor, multiple sites: Secondary | ICD-10-CM | POA: Diagnosis not present

## 2022-01-21 ENCOUNTER — Encounter: Payer: Self-pay | Admitting: Gastroenterology

## 2022-01-22 NOTE — Telephone Encounter (Signed)
Can add famotidine 20 mg at night if needed

## 2022-01-28 ENCOUNTER — Telehealth: Payer: Self-pay | Admitting: *Deleted

## 2022-01-28 ENCOUNTER — Other Ambulatory Visit: Payer: Self-pay | Admitting: Nurse Practitioner

## 2022-01-28 DIAGNOSIS — M064 Inflammatory polyarthropathy: Secondary | ICD-10-CM

## 2022-01-28 MED ORDER — PREDNISONE 10 MG (21) PO TBPK: ORAL_TABLET | ORAL | 0 refills | Status: DC

## 2022-01-28 NOTE — Telephone Encounter (Signed)
Please let her know we did the new prednisone taper for her.

## 2022-01-28 NOTE — Telephone Encounter (Signed)
Patient calling stating she is having a flare and wanted to see if you could send in some prednisone for her rt shoulder pain.  She said her rheumatology office is closed.  She said she has tried muscle relaxer, and vicodin and nothing helps.  She is certain it is a flare. Please advise.

## 2022-01-29 NOTE — Telephone Encounter (Signed)
Confirmed that patient was notified by pharmacy that this had been sent in. Stephanine Reas Zimmerman Rumple, CMA

## 2022-02-01 ENCOUNTER — Encounter: Payer: Self-pay | Admitting: Nurse Practitioner

## 2022-02-06 ENCOUNTER — Encounter: Payer: Self-pay | Admitting: Urology

## 2022-02-06 ENCOUNTER — Ambulatory Visit: Payer: Medicare Other | Admitting: Urology

## 2022-02-06 VITALS — BP 128/75 | HR 65 | Ht 64.0 in

## 2022-02-06 DIAGNOSIS — R351 Nocturia: Secondary | ICD-10-CM

## 2022-02-06 DIAGNOSIS — R350 Frequency of micturition: Secondary | ICD-10-CM

## 2022-02-06 DIAGNOSIS — Z87440 Personal history of urinary (tract) infections: Secondary | ICD-10-CM | POA: Diagnosis not present

## 2022-02-06 DIAGNOSIS — N3941 Urge incontinence: Secondary | ICD-10-CM | POA: Diagnosis not present

## 2022-02-06 DIAGNOSIS — N3281 Overactive bladder: Secondary | ICD-10-CM

## 2022-02-06 DIAGNOSIS — N390 Urinary tract infection, site not specified: Secondary | ICD-10-CM

## 2022-02-06 MED ORDER — ESTRADIOL 0.1 MG/GM VA CREA: TOPICAL_CREAM | VAGINAL | 11 refills | Status: DC

## 2022-02-06 NOTE — Progress Notes (Signed)
   02/06/2022 3:19 PM   Elizabeth Patrick 01/31/1970 [id number]  Reason for visit: Follow up recurrent UTIs, urinary symptoms,OAB  HPI: Comorbid 52 year old female with morbid obesity and rheumatoid arthritis on immunosuppression and diabetes who presented in October 2022 after developing 2 culture positive UTIs in the prior 2 months.  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  With her history of complex abdominal hysterectomy and fistula, I also ordered a CT to evaluate for any obstruction or other etiology of recurrent infections.  CT in December 2022   We opted for a 90-day trial of low-dose antibiotic prophylaxis and topical estrogen cream.  She had a positive urine culture in September 2023, but it sounds like this was more consistent with asymptomatic bacteriuria, not true UTI and she denied any urinary symptoms at that time.  She has some occasional episodes of urinary frequency, urgency, and urge incontinence as well as bothersome nocturia that are intermittent.  She does not feel these are associated with diet or stress.  She is interested in a trial of an OAB medication, will avoid anticholinergics with her chronic dry mouth/dry eyes.  -Trial of Gemtesa, 6 weeks samples given, okay to fill prescription if working well -Topical estrogen cream refilled -RTC 1 year symptom check, sooner if problems    [place], [place] [address], [place], [place] [address] [phone]

## 2022-02-07 ENCOUNTER — Encounter: Payer: Self-pay | Admitting: Gastroenterology

## 2022-02-07 NOTE — Telephone Encounter (Signed)
No issues go ahead and use it

## 2022-02-11 DIAGNOSIS — M0609 Rheumatoid arthritis without rheumatoid factor, multiple sites: Secondary | ICD-10-CM | POA: Diagnosis not present

## 2022-02-11 DIAGNOSIS — L403 Pustulosis palmaris et plantaris: Secondary | ICD-10-CM | POA: Diagnosis not present

## 2022-02-11 DIAGNOSIS — M5136 Other intervertebral disc degeneration, lumbar region: Secondary | ICD-10-CM | POA: Diagnosis not present

## 2022-02-11 DIAGNOSIS — G894 Chronic pain syndrome: Secondary | ICD-10-CM | POA: Diagnosis not present

## 2022-02-12 DIAGNOSIS — M542 Cervicalgia: Secondary | ICD-10-CM | POA: Diagnosis not present

## 2022-02-13 DIAGNOSIS — M25531 Pain in right wrist: Secondary | ICD-10-CM | POA: Diagnosis not present

## 2022-02-13 DIAGNOSIS — M542 Cervicalgia: Secondary | ICD-10-CM | POA: Diagnosis not present

## 2022-02-18 DIAGNOSIS — M0609 Rheumatoid arthritis without rheumatoid factor, multiple sites: Secondary | ICD-10-CM | POA: Diagnosis not present

## 2022-02-18 DIAGNOSIS — Z79899 Other long term (current) drug therapy: Secondary | ICD-10-CM | POA: Diagnosis not present

## 2022-02-20 ENCOUNTER — Telehealth: Payer: Self-pay | Admitting: *Deleted

## 2022-02-20 DIAGNOSIS — M47816 Spondylosis without myelopathy or radiculopathy, lumbar region: Secondary | ICD-10-CM | POA: Diagnosis not present

## 2022-02-20 NOTE — Telephone Encounter (Signed)
Contacted pt to let her know that her medication from the patient assistance program arrived and she will come by an pick them up. Kelita Wallis Zimmerman Rumple, CMA

## 2022-02-22 DIAGNOSIS — M25531 Pain in right wrist: Secondary | ICD-10-CM | POA: Diagnosis not present

## 2022-02-22 DIAGNOSIS — M67431 Ganglion, right wrist: Secondary | ICD-10-CM | POA: Diagnosis not present

## 2022-02-27 ENCOUNTER — Other Ambulatory Visit: Payer: Self-pay | Admitting: Nurse Practitioner

## 2022-02-27 DIAGNOSIS — E669 Obesity, unspecified: Secondary | ICD-10-CM

## 2022-02-27 DIAGNOSIS — I10 Essential (primary) hypertension: Secondary | ICD-10-CM

## 2022-03-01 ENCOUNTER — Ambulatory Visit (INDEPENDENT_AMBULATORY_CARE_PROVIDER_SITE_OTHER): Payer: Medicare Other | Admitting: Nurse Practitioner

## 2022-03-01 ENCOUNTER — Encounter: Payer: Self-pay | Admitting: Nurse Practitioner

## 2022-03-01 VITALS — BP 138/79 | HR 62 | Resp 18 | Ht 64.0 in | Wt 328.0 lb

## 2022-03-01 DIAGNOSIS — E1142 Type 2 diabetes mellitus with diabetic polyneuropathy: Secondary | ICD-10-CM

## 2022-03-01 DIAGNOSIS — E1159 Type 2 diabetes mellitus with other circulatory complications: Secondary | ICD-10-CM | POA: Diagnosis not present

## 2022-03-01 DIAGNOSIS — E119 Type 2 diabetes mellitus without complications: Secondary | ICD-10-CM

## 2022-03-01 DIAGNOSIS — M069 Rheumatoid arthritis, unspecified: Secondary | ICD-10-CM

## 2022-03-01 DIAGNOSIS — I152 Hypertension secondary to endocrine disorders: Secondary | ICD-10-CM

## 2022-03-01 LAB — POCT GLYCOSYLATED HEMOGLOBIN (HGB A1C): HbA1c POC (<> result, manual entry): 5.8 % (ref 4.0–5.6)

## 2022-03-01 NOTE — Progress Notes (Signed)
Established patient visit   Patient: Elizabeth Patrick   DOB: 1970-09-05   52 y.o. Female  MRN: [id number] Visit Date: 03/01/2022   Chief Complaint  Patient presents with   Follow-up   Diabetes   Subjective    HPI  Follow up  -diabetes  --HgbA1c today is - 5.8 --currently taking ozempic 1 mg weekly  -HTN --well managed on current medication -sees rheumatology and pain management  -She denies chest pain, chest pressure, or shortness of breath. She denies headaches or visual disturbances.  She denies abdominal pain, nausea, vomiting, or changes in bowel or bladder habits.      Medications: Outpatient Medications Prior to Visit  Medication Sig   acyclovir (ZOVIRAX) 400 MG tablet Take 1 tablet (400 mg total) by mouth 2 (two) times daily.   amLODipine (NORVASC) 10 MG tablet TAKE 1 TABLET BY MOUTH DAILY   atorvastatin (LIPITOR) 10 MG tablet Take 1 tablet (10 mg total) by mouth daily.   B Complex-C (SUPER B-C PO) Super B-C   clobetasol cream (TEMOVATE) 2.44 % Apply 1 application. topically 2 (two) times daily.   Cranberry 1000 MG CAPS    cyanocobalamin (VITAMIN B12) 1000 MCG/ML injection Inject 1 mL (1,000 mcg total) into the muscle every 30 (thirty) days.   estradiol (ESTRACE) 0.1 MG/GM vaginal cream Estrogen Cream Instruction Discard applicator Apply pea sized amount to tip of finger to urethra before bed. Wash hands well after application. Use Monday, Wednesday and Friday   estradiol (VIVELLE-DOT) 0.0375 MG/24HR Place 1 patch onto the skin 2 (two) times a week.   fenofibrate (TRICOR) 145 MG tablet TAKE 1 TABLET BY MOUTH EVERY DAY WITH DINNER   furosemide (LASIX) 20 MG tablet Take 20 mg by mouth as needed.   HYDROcodone-acetaminophen (NORCO/VICODIN) 5-325 MG tablet Take 1 tablet by mouth every 8 (eight) hours as needed.   ibuprofen (ADVIL) 600 MG tablet Take 600 mg by mouth 3 (three) times daily as needed.   ketoconazole (NIZORAL) 2 % cream Apply topically 2 (two) times daily.    Lidocaine (BLUE-EMU PAIN RELIEF DRY EX) Apply topically.   losartan (COZAAR) 100 MG tablet TAKE 1 TABLET(100 MG) BY MOUTH DAILY   metoprolol tartrate (LOPRESSOR) 25 MG tablet Take 25 mg by mouth.   pregabalin (LYRICA) 225 MG capsule Take 225 mg by mouth 3 (three) times daily.   progesterone (PROMETRIUM) 100 MG capsule Take 1 capsule (100 mg total) by mouth daily.   Semaglutide, 1 MG/DOSE, 4 MG/3ML SOPN Inject 1 mg as directed once a week.   tiZANidine (ZANAFLEX) 2 MG tablet Take by mouth every 6 (six) hours as needed for muscle spasms.   Tocilizumab (ACTEMRA IV) Inject into the vein.   [DISCONTINUED] esomeprazole (NEXIUM) 40 MG capsule Take 40 mg by mouth daily.   No facility-administered medications prior to visit.    Review of Systems See HPI     Last CBC Lab Results  Component Value Date   WBC 5.1 07/23/2021   HGB 16.2 (H) 07/23/2021   HCT 47.5 (H) 07/23/2021   MCV 94 07/23/2021   MCH 32.2 07/23/2021   RDW 12.1 07/23/2021   PLT 225 01/16/7251   Last metabolic panel Lab Results  Component Value Date   GLUCOSE 101 (H) 04/16/2021   NA 142 04/16/2021   K 4.0 04/16/2021   CL 101 04/16/2021   CO2 23 04/16/2021   BUN 15 04/16/2021   CREATININE 0.81 04/16/2021   EGFR 87 04/16/2021   CALCIUM 9.9 04/16/2021  PROT 6.9 04/16/2021   ALBUMIN 5.0 (H) 04/16/2021   LABGLOB 1.9 04/16/2021   AGRATIO 2.6 (H) 04/16/2021   BILITOT 0.6 04/16/2021   ALKPHOS 47 04/16/2021   AST 33 04/16/2021   ALT 55 (H) 04/16/2021   Last lipids Lab Results  Component Value Date   CHOL 151 03/05/2021   HDL 61 03/05/2021   LDLCALC 73 03/05/2021   TRIG 89 03/05/2021   CHOLHDL 2.5 03/05/2021   Last hemoglobin A1c Lab Results  Component Value Date   HGBA1C 5.8 03/01/2022   Last thyroid functions Lab Results  Component Value Date   TSH 0.895 04/16/2021   Last vitamin B12 and Folate Lab Results  Component Value Date   VITAMINB12 789 07/23/2021   FOLATE 16.0 07/23/2021       Objective      Today's Vitals   03/01/22 1106  BP: 138/79  Pulse: 62  Resp: 18  SpO2: 96%  Weight: (Abnormal) 328 lb (148.8 kg)  Height: 5\' 4"  (1.626 m)   Body mass index is 56.3 kg/m.  BP Readings from Last 3 Encounters:  03/01/22 138/79  02/06/22 128/75  12/17/21 (Abnormal) 150/90    Wt Readings from Last 3 Encounters:  03/01/22 (Abnormal) 328 lb (148.8 kg)  11/29/21 (Abnormal) 327 lb (148.3 kg)  10/23/21 (Abnormal) 324 lb 1.9 oz (147 kg)    Physical Exam Vitals and nursing note reviewed.  Constitutional:      Appearance: Normal appearance. She is well-developed. She is obese.  HENT:     Head: Normocephalic and atraumatic.     Nose: Nose normal.     Mouth/Throat:     Mouth: Mucous membranes are moist.     Pharynx: Oropharynx is clear.  Eyes:     Extraocular Movements: Extraocular movements intact.     Conjunctiva/sclera: Conjunctivae normal.     Pupils: Pupils are equal, round, and reactive to light.  Cardiovascular:     Rate and Rhythm: Normal rate and regular rhythm.     Pulses: Normal pulses.     Heart sounds: Normal heart sounds.  Pulmonary:     Effort: Pulmonary effort is normal.     Breath sounds: Normal breath sounds.  Abdominal:     Palpations: Abdomen is soft.  Musculoskeletal:        General: Normal range of motion.     Cervical back: Normal range of motion and neck supple.     Right lower leg: Edema present.     Left lower leg: Edema present.  Lymphadenopathy:     Cervical: No cervical adenopathy.  Skin:    General: Skin is warm and dry.     Capillary Refill: Capillary refill takes less than 2 seconds.  Neurological:     General: No focal deficit present.     Mental Status: She is alert and oriented to person, place, and time.  Psychiatric:        Mood and Affect: Mood normal.        Behavior: Behavior normal.        Thought Content: Thought content normal.        Judgment: Judgment normal.     Results for orders placed or performed in visit on  03/01/22  POCT HgB A1C  Result Value Ref Range   Hemoglobin A1C     HbA1c POC (<> result, manual entry) 5.8 4.0 - 5.6 %   HbA1c, POC (prediabetic range)     HbA1c, POC (controlled diabetic range)  Assessment & Plan    1. Type 2 diabetes mellitus without complication, without long-term current use of insulin (HCC) HgbA1c 5.8 today. Continue ozempic as previously prescribed. Recheck Hgba1c in 3 months.  - POCT HgB A1C  2. Hypertension associated with type 2 diabetes mellitus (HCC) Bp stable. Continue bp medication as prescribed   3. Diabetic polyneuropathy associated with type 2 diabetes mellitus (HCC) Stable. Lyrica 225 mg three times daily prescribed per pain management.   4. Rheumatoid arthritis, involving unspecified site, unspecified whether rheumatoid factor present [facility]) Continue regular visits with rheumatology.   5. Morbid obesity ([place]) Continue with low calorie, high-protein diet. Incorporate exercise into daily activities.      Problem List Items Addressed This Visit       Cardiovascular and Mediastinum   Primary hypertension     Endocrine   Type 2 diabetes mellitus without complication, with long-term current use of insulin ([place]) - Primary   Diabetic polyneuropathy associated with type 2 diabetes mellitus ([place])     Musculoskeletal and Integument   Rheumatoid arthritis ([place])     Other   Morbid obesity ([place])     Return in about 3 months (around 05/30/2022) for diabetes with HgbA1c check, needs CPE in 6 months with [facility] a week prior .         Ronnell Freshwater, NP  [facility] [phone] (phone) [phone] (fax)  [place]

## 2022-03-12 DIAGNOSIS — M25531 Pain in right wrist: Secondary | ICD-10-CM | POA: Diagnosis not present

## 2022-03-14 ENCOUNTER — Ambulatory Visit: Payer: Medicare Other | Admitting: Gastroenterology

## 2022-03-15 DIAGNOSIS — M180 Bilateral primary osteoarthritis of first carpometacarpal joints: Secondary | ICD-10-CM | POA: Diagnosis not present

## 2022-03-15 DIAGNOSIS — M1811 Unilateral primary osteoarthritis of first carpometacarpal joint, right hand: Secondary | ICD-10-CM | POA: Diagnosis not present

## 2022-03-15 DIAGNOSIS — M1812 Unilateral primary osteoarthritis of first carpometacarpal joint, left hand: Secondary | ICD-10-CM | POA: Diagnosis not present

## 2022-03-15 DIAGNOSIS — M79645 Pain in left finger(s): Secondary | ICD-10-CM | POA: Diagnosis not present

## 2022-03-15 DIAGNOSIS — M67431 Ganglion, right wrist: Secondary | ICD-10-CM | POA: Diagnosis not present

## 2022-03-18 DIAGNOSIS — M0609 Rheumatoid arthritis without rheumatoid factor, multiple sites: Secondary | ICD-10-CM | POA: Diagnosis not present

## 2022-03-24 ENCOUNTER — Encounter: Payer: Self-pay | Admitting: Gastroenterology

## 2022-03-25 ENCOUNTER — Other Ambulatory Visit: Payer: Self-pay

## 2022-03-25 DIAGNOSIS — N3281 Overactive bladder: Secondary | ICD-10-CM

## 2022-03-25 MED ORDER — ESOMEPRAZOLE MAGNESIUM 40 MG PO CPDR: 40.0000 mg | DELAYED_RELEASE_CAPSULE | Freq: Every day | ORAL | 0 refills | Status: DC

## 2022-03-25 MED ORDER — GEMTESA 75 MG PO TABS: 75.0000 mg | ORAL_TABLET | Freq: Every day | ORAL | 11 refills | Status: DC

## 2022-03-25 NOTE — Telephone Encounter (Signed)
See my chart message

## 2022-04-03 DIAGNOSIS — M47816 Spondylosis without myelopathy or radiculopathy, lumbar region: Secondary | ICD-10-CM | POA: Diagnosis not present

## 2022-04-09 DIAGNOSIS — F411 Generalized anxiety disorder: Secondary | ICD-10-CM | POA: Diagnosis not present

## 2022-04-16 DIAGNOSIS — M0609 Rheumatoid arthritis without rheumatoid factor, multiple sites: Secondary | ICD-10-CM | POA: Diagnosis not present

## 2022-04-29 DIAGNOSIS — M170 Bilateral primary osteoarthritis of knee: Secondary | ICD-10-CM | POA: Diagnosis not present

## 2022-05-06 ENCOUNTER — Telehealth: Payer: Self-pay

## 2022-05-06 DIAGNOSIS — M25511 Pain in right shoulder: Secondary | ICD-10-CM | POA: Diagnosis not present

## 2022-05-06 DIAGNOSIS — M542 Cervicalgia: Secondary | ICD-10-CM | POA: Diagnosis not present

## 2022-05-06 NOTE — Telephone Encounter (Signed)
Contacted Elizabeth Patrick to schedule their annual wellness visit. Appointment made for 05/06/22.  Agnes Lawrence, CMA (AAMA)  [facility] [phone]

## 2022-05-07 ENCOUNTER — Ambulatory Visit (INDEPENDENT_AMBULATORY_CARE_PROVIDER_SITE_OTHER): Payer: Medicare Other

## 2022-05-07 VITALS — Ht 64.0 in | Wt 328.0 lb

## 2022-05-07 DIAGNOSIS — Z1211 Encounter for screening for malignant neoplasm of colon: Secondary | ICD-10-CM

## 2022-05-07 DIAGNOSIS — Z0000 Encounter for general adult medical examination without abnormal findings: Secondary | ICD-10-CM

## 2022-05-07 NOTE — Patient Instructions (Addendum)
Ms. Elizabeth Patrick , Thank you for taking time to come for your Medicare Wellness Visit. I appreciate your ongoing commitment to your health goals. Please review the following plan we discussed and let me know if I can assist you in the future.   These are the goals we discussed:  Goals       Lose weight (pt-stated)      Obtain Supportive resources      Timeframe:  Long-Range Goal Priority:  High Start Date:              10/10/20               Expected End Date:      04/12/21                Follow Up Date 04/09/21   Patient Goals/Self-Care Activities: Over the next 120 days Attend scheduled appointments with providers Utilize healthy coping skills discussed Contact PCP office with any questions or concerns      Patient Stated      Loose weight        This is a list of the screening recommended for you and due dates:  Health Maintenance  Topic Date Due   DTaP/Tdap/Td vaccine (1 - Tdap) Never done   Yearly kidney health urinalysis for diabetes  05/23/2022   COVID-19 Vaccine (4 - 2023-24 season) 05/23/2022*   Yearly kidney function blood test for diabetes  08/06/2022*   Zoster (Shingles) Vaccine (1 of 2) 08/06/2022*   Colon Cancer Screening  05/07/2023*   Complete foot exam   05/23/2022   Eye exam for diabetics  06/21/2022   Flu Shot  08/15/2022   Hemoglobin A1C  08/30/2022   Mammogram  02/13/2023   Medicare Annual Wellness Visit  05/07/2023   Pap Smear  05/22/2024   Hepatitis C Screening: USPSTF Recommendation to screen - Ages 18-79 yo.  Completed   HIV Screening  Completed   HPV Vaccine  Aged Out  *Topic was postponed. The date shown is not the original due date.   Opioid Pain Medicine Management Opioids are powerful medicines that are used to treat moderate to severe pain. When used for short periods of time, they can help you to: Sleep better. Do better in physical or occupational therapy. Feel better in the first few days after an injury. Recover from surgery. Opioids  should be taken with the supervision of a trained health care provider. They should be taken for the shortest period of time possible. This is because opioids can be addictive, and the longer you take opioids, the greater your risk of addiction. This addiction can also be called opioid use disorder. What are the risks? Using opioid pain medicines for longer than 3 days increases your risk of side effects. Side effects include: Constipation. Nausea and vomiting. Breathing difficulties (respiratory depression). Drowsiness. Confusion. Opioid use disorder. Itching. Taking opioid pain medicine for a long period of time can affect your ability to do daily tasks. It also puts you at risk for: Motor vehicle crashes. Depression. Suicide. Heart attack. Overdose, which can be life-threatening. What is a pain treatment plan? A pain treatment plan is an agreement between you and your health care provider. Pain is unique to each person, and treatments vary depending on your condition. To manage your pain, you and your health care provider need to work together. To help you do this: Discuss the goals of your treatment, including how much pain you might expect to have and how  you will manage the pain. Review the risks and benefits of taking opioid medicines. Remember that a good treatment plan uses more than one approach and minimizes the chance of side effects. Be honest about the amount of medicines you take and about any drug or alcohol use. Get pain medicine prescriptions from only one health care provider. Pain can be managed with many types of alternative treatments. Ask your health care provider to refer you to one or more specialists who can help you manage pain through: Physical or occupational therapy. Counseling (cognitive behavioral therapy). Good nutrition. Biofeedback. Massage. Meditation. Non-opioid medicine. Following a gentle exercise program. How to use opioid pain medicine Taking  medicine Take your pain medicine exactly as told by your health care provider. Take it only when you need it. If your pain gets less severe, you may take less than your prescribed dose if your health care provider approves. If you are not having pain, do nottake pain medicine unless your health care provider tells you to take it. If your pain is severe, do nottry to treat it yourself by taking more pills than instructed on your prescription. Contact your health care provider for help. Write down the times when you take your pain medicine. It is easy to become confused while on pain medicine. Writing the time can help you avoid overdose. Take other over-the-counter or prescription medicines only as told by your health care provider. Keeping yourself and others safe  While you are taking opioid pain medicine: Do not drive, use machinery, or power tools. Do not sign legal documents. Do not drink alcohol. Do not take sleeping pills. Do not supervise children by yourself. Do not do activities that require climbing or being in high places. Do not go to a lake, river, ocean, spa, or swimming pool. Do not share your pain medicine with anyone. Keep pain medicine in a locked cabinet or in a secure area where pets and children cannot reach it. Stopping your use of opioids If you have been taking opioid medicine for more than a few weeks, you may need to slowly decrease (taper) how much you take until you stop completely. Tapering your use of opioids can decrease your risk of symptoms of withdrawal, such as: Pain and cramping in the abdomen. Nausea. Sweating. Sleepiness. Restlessness. Uncontrollable shaking (tremors). Cravings for the medicine. Do not attempt to taper your use of opioids on your own. Talk with your health care provider about how to do this. Your health care provider may prescribe a step-down schedule based on how much medicine you are taking and how long you have been taking  it. Getting rid of leftover pills Do not save any leftover pills. Get rid of leftover pills safely by: Taking the medicine to a prescription take-back program. This is usually offered by the county or law enforcement. Bringing them to a pharmacy that has a drug disposal container. Flushing them down the toilet. Check the label or package insert of your medicine to see whether this is safe to do. Throwing them out in the trash. Check the label or package insert of your medicine to see whether this is safe to do. If it is safe to throw it out, remove the medicine from the original container, put it into a sealable bag or container, and mix it with used coffee grounds, food scraps, dirt, or cat litter before putting it in the trash. Follow these instructions at home: Activity Do exercises as told by your health care provider.  Avoid activities that make your pain worse. Return to your normal activities as told by your health care provider. Ask your health care provider what activities are safe for you. General instructions You may need to take these actions to prevent or treat constipation: Drink enough fluid to keep your urine pale yellow. Take over-the-counter or prescription medicines. Eat foods that are high in fiber, such as beans, whole grains, and fresh fruits and vegetables. Limit foods that are high in fat and processed sugars, such as fried or sweet foods. Keep all follow-up visits. This is important. Where to find support If you have been taking opioids for a long time, you may benefit from receiving support for quitting from a local support group or counselor. Ask your health care provider for a referral to these resources in your area. Where to find more information Centers for Disease Control and Prevention (CDC): FootballExhibition.com.br [place] Food and Drug Administration (FDA): PumpkinSearch.com.ee Get help right away if: You may have taken too much of an opioid (overdosed). Common symptoms of an  overdose: Your breathing is slower or more shallow than normal. You have a very slow heartbeat (pulse). You have slurred speech. You have nausea and vomiting. Your pupils become very small. You have other potential symptoms: You are very confused. You faint or feel like you will faint. You have cold, clammy skin. You have blue lips or fingernails. You have thoughts of harming yourself or harming others. These symptoms may represent a serious problem that is an emergency. Do not wait to see if the symptoms will go away. Get medical help right away. Call your local emergency services (911 in the [place] Do not drive yourself to the hospital.  If you ever feel like you may hurt yourself or others, or have thoughts about taking your own life, get help right away. Go to your nearest emergency department or: Call your local emergency services (911 in the [place] Call the [facility] (408-415-2925 in the [place] Call a suicide crisis helpline, such as the National Suicide Prevention Lifeline at (505) 017-5150 or 988 in the [place] This is open 24 hours a day in the [place] Text the Crisis Text Line at 336-190-1167 (in the [place] Summary Opioid medicines can help you manage moderate to severe pain for a short period of time. A pain treatment plan is an agreement between you and your health care provider. Discuss the goals of your treatment, including how much pain you might expect to have and how you will manage the pain. If you think that you or someone else may have taken too much of an opioid, get medical help right away. This information is not intended to replace advice given to you by your health care provider. Make sure you discuss any questions you have with your health care provider. Document Revised: 07/26/2020 Document Reviewed: 04/12/2020 Elsevier Patient Education  2023 [employer]  Advanced directives: Advance directive discussed with you today. Even though you declined this  today, please call our office should you change your mind, and we can give you the proper paperwork for you to fill out.   Conditions/risks identified: None  Next appointment: Follow up in one year for your annual wellness visit.   Preventive Care 40-64 Years, Female Preventive care refers to lifestyle choices and visits with your health care provider that can promote health and wellness. What does preventive care include? A yearly physical exam. This is also called an annual well check. Dental exams once or  twice a year. Routine eye exams. Ask your health care provider how often you should have your eyes checked. Personal lifestyle choices, including: Daily care of your teeth and gums. Regular physical activity. Eating a healthy diet. Avoiding tobacco and drug use. Limiting alcohol use. Practicing safe sex. Taking low-dose aspirin daily starting at age 82. Taking vitamin and mineral supplements as recommended by your health care provider. What happens during an annual well check? The services and screenings done by your health care provider during your annual well check will depend on your age, overall health, lifestyle risk factors, and family history of disease. Counseling  Your health care provider may ask you questions about your: Alcohol use. Tobacco use. Drug use. Emotional well-being. Home and relationship well-being. Sexual activity. Eating habits. Work and work Astronomer. Method of birth control. Menstrual cycle. Pregnancy history. Screening  You may have the following tests or measurements: Height, weight, and BMI. Blood pressure. Lipid and cholesterol levels. These may be checked every 5 years, or more frequently if you are over 86 years old. Skin check. Lung cancer screening. You may have this screening every year starting at age 63 if you have a 30-pack-year history of smoking and currently smoke or have quit within the past 15 years. Fecal occult blood test  (FOBT) of the stool. You may have this test every year starting at age 2. Flexible sigmoidoscopy or colonoscopy. You may have a sigmoidoscopy every 5 years or a colonoscopy every 10 years starting at age 88. Hepatitis C blood test. Hepatitis B blood test. Sexually transmitted disease (STD) testing. Diabetes screening. This is done by checking your blood sugar (glucose) after you have not eaten for a while (fasting). You may have this done every 1-3 years. Mammogram. This may be done every 1-2 years. Talk to your health care provider about when you should start having regular mammograms. This may depend on whether you have a family history of breast cancer. BRCA-related cancer screening. This may be done if you have a family history of breast, ovarian, tubal, or peritoneal cancers. Pelvic exam and Pap test. This may be done every 3 years starting at age 75. Starting at age 17, this may be done every 5 years if you have a Pap test in combination with an HPV test. Bone density scan. This is done to screen for osteoporosis. You may have this scan if you are at high risk for osteoporosis. Discuss your test results, treatment options, and if necessary, the need for more tests with your health care provider. Vaccines  Your health care provider may recommend certain vaccines, such as: Influenza vaccine. This is recommended every year. Tetanus, diphtheria, and acellular pertussis (Tdap, Td) vaccine. You may need a Td booster every 10 years. Zoster vaccine. You may need this after age 83. Pneumococcal 13-valent conjugate (PCV13) vaccine. You may need this if you have certain conditions and were not previously vaccinated. Pneumococcal polysaccharide (PPSV23) vaccine. You may need one or two doses if you smoke cigarettes or if you have certain conditions. Talk to your health care provider about which screenings and vaccines you need and how often you need them. This information is not intended to replace  advice given to you by your health care provider. Make sure you discuss any questions you have with your health care provider. Document Released: 01/27/2015 Document Revised: 09/20/2015 Document Reviewed: 11/01/2014 Elsevier Interactive Patient Education  2017 [employer].    Fall Prevention in the Home Falls can cause injuries. They  can happen to people of all ages. There are many things you can do to make your home safe and to help prevent falls. What can I do on the outside of my home? Regularly fix the edges of walkways and driveways and fix any cracks. Remove anything that might make you trip as you walk through a door, such as a raised step or threshold. Trim any bushes or trees on the path to your home. Use bright outdoor lighting. Clear any walking paths of anything that might make someone trip, such as rocks or tools. Regularly check to see if handrails are loose or broken. Make sure that both sides of any steps have handrails. Any raised decks and porches should have guardrails on the edges. Have any leaves, snow, or ice cleared regularly. Use sand or salt on walking paths during winter. Clean up any spills in your garage right away. This includes oil or grease spills. What can I do in the bathroom? Use night lights. Install grab bars by the toilet and in the tub and shower. Do not use towel bars as grab bars. Use non-skid mats or decals in the tub or shower. If you need to sit down in the shower, use a plastic, non-slip stool. Keep the floor dry. Clean up any water that spills on the floor as soon as it happens. Remove soap buildup in the tub or shower regularly. Attach bath mats securely with double-sided non-slip rug tape. Do not have throw rugs and other things on the floor that can make you trip. What can I do in the bedroom? Use night lights. Make sure that you have a light by your bed that is easy to reach. Do not use any sheets or blankets that are too big for your  bed. They should not hang down onto the floor. Have a firm chair that has side arms. You can use this for support while you get dressed. Do not have throw rugs and other things on the floor that can make you trip. What can I do in the kitchen? Clean up any spills right away. Avoid walking on wet floors. Keep items that you use a lot in easy-to-reach places. If you need to reach something above you, use a strong step stool that has a grab bar. Keep electrical cords out of the way. Do not use floor polish or wax that makes floors slippery. If you must use wax, use non-skid floor wax. Do not have throw rugs and other things on the floor that can make you trip. What can I do with my stairs? Do not leave any items on the stairs. Make sure that there are handrails on both sides of the stairs and use them. Fix handrails that are broken or loose. Make sure that handrails are as long as the stairways. Check any carpeting to make sure that it is firmly attached to the stairs. Fix any carpet that is loose or worn. Avoid having throw rugs at the top or bottom of the stairs. If you do have throw rugs, attach them to the floor with carpet tape. Make sure that you have a light switch at the top of the stairs and the bottom of the stairs. If you do not have them, ask someone to add them for you. What else can I do to help prevent falls? Wear shoes that: Do not have high heels. Have rubber bottoms. Are comfortable and fit you well. Are closed at the toe. Do not wear sandals.  If you use a stepladder: Make sure that it is fully opened. Do not climb a closed stepladder. Make sure that both sides of the stepladder are locked into place. Ask someone to hold it for you, if possible. Clearly mark and make sure that you can see: Any grab bars or handrails. First and last steps. Where the edge of each step is. Use tools that help you move around (mobility aids) if they are needed. These  include: Canes. Walkers. Scooters. Crutches. Turn on the lights when you go into a dark area. Replace any light bulbs as soon as they burn out. Set up your furniture so you have a clear path. Avoid moving your furniture around. If any of your floors are uneven, fix them. If there are any pets around you, be aware of where they are. Review your medicines with your doctor. Some medicines can make you feel dizzy. This can increase your chance of falling. Ask your doctor what other things that you can do to help prevent falls. This information is not intended to replace advice given to you by your health care provider. Make sure you discuss any questions you have with your health care provider. Document Released: 10/27/2008 Document Revised: 06/08/2015 Document Reviewed: 02/04/2014 Elsevier Interactive Patient Education  2017 [employer].

## 2022-05-07 NOTE — Progress Notes (Signed)
Subjective:   Elizabeth Patrick is a 52 y.o. female who presents for Medicare Annual (Subsequent) preventive examination.  Review of Systems    Virtual Visit via Telephone Note  I connected with  Elizabeth Patrick on 05/07/22 at 11:00 AM EDT by telephone and verified that I am speaking with the correct person using two identifiers.  Location: Patient: Home Provider: Office Persons participating in the virtual visit: patient/Nurse Health Advisor   I discussed the limitations, risks, security and privacy concerns of performing an evaluation and management service by telephone and the availability of in person appointments. The patient expressed understanding and agreed to proceed.  Interactive audio and video telecommunications were attempted between this nurse and patient, however failed, due to patient having technical difficulties OR patient did not have access to video capability.  We continued and completed visit with audio only.  Some vital signs may be absent or patient reported.   Tillie Rung, LPN  Cardiac Risk Factors include: advanced age (>40men, >20 women);diabetes mellitus;hypertension     Objective:    Today's Vitals   05/07/22 1103  Weight: (!) 328 lb (148.8 kg)  Height: 5\' 4"  (1.626 m)   Body mass index is 56.3 kg/m.     05/07/2022   11:11 AM 07/03/2021    4:08 PM 05/10/2021    2:17 PM 12/14/2020   11:46 AM  Advanced Directives  Does Patient Have a Medical Advance Directive? No No No No  Would patient like information on creating a medical advance directive? No - Patient declined No - Patient declined Yes (Inpatient - patient defers creating a medical advance directive at this time - Information given) No - Patient declined    Current Medications (verified) Outpatient Encounter Medications as of 05/07/2022  Medication Sig   acyclovir (ZOVIRAX) 400 MG tablet Take 1 tablet (400 mg total) by mouth 2 (two) times daily.   amLODipine (NORVASC) 10 MG tablet  TAKE 1 TABLET BY MOUTH DAILY   atorvastatin (LIPITOR) 10 MG tablet Take 1 tablet (10 mg total) by mouth daily.   B Complex-C (SUPER B-C PO) Super B-C   clobetasol cream (TEMOVATE) 0.05 % Apply 1 application. topically 2 (two) times daily.   Cranberry 1000 MG CAPS    cyanocobalamin (VITAMIN B12) 1000 MCG/ML injection Inject 1 mL (1,000 mcg total) into the muscle every 30 (thirty) days.   esomeprazole (NEXIUM) 40 MG capsule Take 1 capsule (40 mg total) by mouth daily.   estradiol (ESTRACE) 0.1 MG/GM vaginal cream Estrogen Cream Instruction Discard applicator Apply pea sized amount to tip of finger to urethra before bed. Wash hands well after application. Use Monday, Wednesday and Friday   estradiol (VIVELLE-DOT) 0.0375 MG/24HR Place 1 patch onto the skin 2 (two) times a week.   fenofibrate (TRICOR) 145 MG tablet TAKE 1 TABLET BY MOUTH EVERY DAY WITH DINNER   furosemide (LASIX) 20 MG tablet Take 20 mg by mouth as needed.   HYDROcodone-acetaminophen (NORCO/VICODIN) 5-325 MG tablet Take 1 tablet by mouth every 8 (eight) hours as needed.   ibuprofen (ADVIL) 600 MG tablet Take 600 mg by mouth 3 (three) times daily as needed.   ketoconazole (NIZORAL) 2 % cream Apply topically 2 (two) times daily.   Lidocaine (BLUE-EMU PAIN RELIEF DRY EX) Apply topically.   losartan (COZAAR) 100 MG tablet TAKE 1 TABLET(100 MG) BY MOUTH DAILY   metoprolol tartrate (LOPRESSOR) 25 MG tablet Take 25 mg by mouth.   pregabalin (LYRICA) 225 MG capsule Take  225 mg by mouth 3 (three) times daily.   progesterone (PROMETRIUM) 100 MG capsule Take 1 capsule (100 mg total) by mouth daily.   Semaglutide, 1 MG/DOSE, 4 MG/3ML SOPN Inject 1 mg as directed once a week.   tiZANidine (ZANAFLEX) 2 MG tablet Take by mouth every 6 (six) hours as needed for muscle spasms.   Tocilizumab (ACTEMRA IV) Inject into the vein.   Vibegron (GEMTESA) 75 MG TABS Take 1 tablet (75 mg total) by mouth daily.   No facility-administered encounter medications  on file as of 05/07/2022.    Allergies (verified) Alcohol and Dilantin [phenytoin]   History: Past Medical History:  Diagnosis Date   Abnormal uterine bleeding    Anemia    Blood transfusion without reported diagnosis    Diabetes mellitus without complication    Dysmenorrhea    Endometriosis    Fibroid    Genital warts    RA (rheumatoid arthritis)    Past Surgical History:  Procedure Laterality Date   ABDOMINAL HYSTERECTOMY     PARATHYROIDECTOMY     Family History  Problem Relation Age of Onset   Obesity Mother    Coronary artery disease Mother    Bipolar disorder Mother    Multiple sclerosis Mother    Hypertension Father    Heart attack Father    Vascular Disease Father    Breast cancer Maternal Grandmother 74   Social History   Socioeconomic History   Marital status: Married    Spouse name: Not on file   Number of children: Not on file   Years of education: Not on file   Highest education level: Not on file  Occupational History   Not on file  Tobacco Use   Smoking status: Former    Packs/day: 1.00    Years: 10.00    Additional pack years: 0.00    Total pack years: 10.00    Types: Cigarettes    Quit date: 01/14/2005    Years since quitting: 17.3    Passive exposure: Past   Smokeless tobacco: Never  Substance and Sexual Activity   Alcohol use: Not Currently   Drug use: Never   Sexual activity: Yes    Partners: Male    Birth control/protection: Surgical  Other Topics Concern   Not on file  Social History Narrative   Not on file   Social Determinants of Health   Financial Resource Strain: Low Risk  (05/07/2022)   Overall Financial Resource Strain (CARDIA)    Difficulty of Paying Living Expenses: Not hard at all  Food Insecurity: No Food Insecurity (05/07/2022)   Hunger Vital Sign    Worried About Running Out of Food in the Last Year: Never true    Ran Out of Food in the Last Year: Never true  Transportation Needs: No Transportation Needs  (05/07/2022)   PRAPARE - Administrator, Civil Service (Medical): No    Lack of Transportation (Non-Medical): No  Physical Activity: Inactive (05/07/2022)   Exercise Vital Sign    Days of Exercise per Week: 0 days    Minutes of Exercise per Session: 0 min  Stress: No Stress Concern Present (05/07/2022)   [employer] of Occupational Health - Occupational Stress Questionnaire    Feeling of Stress : Not at all  Social Connections: Moderately Isolated (05/07/2022)   Social Connection and Isolation Panel [NHANES]    Frequency of Communication with Friends and Family: More than three times a week    Frequency of  Social Gatherings with Friends and Family: More than three times a week    Attends Religious Services: Never    Database administrator or Organizations: No    Attends Engineer, structural: Never    Marital Status: Married    Tobacco Counseling Counseling given: Not Answered   Clinical Intake:  Pre-visit preparation completed: No  Nutrition Risk Assessment:  Has the patient had any N/V/D within the last 2 months?  No  Does the patient have any non-healing wounds?  No  Has the patient had any unintentional weight loss or weight gain?  No   Diabetes:  Is the patient diabetic?  Yes  If diabetic, was a CBG obtained today?  No  Did the patient bring in their glucometer from home?  No  How often do you monitor your CBG's? PRN.   Financial Strains and Diabetes Management:  Are you having any financial strains with the device, your supplies or your medication? No .  Does the patient want to be seen by Chronic Care Management for management of their diabetes?  No  Would the patient like to be referred to a Nutritionist or for Diabetic Management?  No   Diabetic Exams:  Diabetic Eye Exam: Completed . Overdue for diabetic eye exam. Pt has been advised about the importance in completing this exam. A referral has been placed today. Message sent to referral  coordinator for scheduling purposes. Advised pt to expect a call from office referred to regarding appt.  Diabetic Foot Exam: Completed . Pt has been advised about the importance in completing this exam. Pt is scheduled for diabetic foot exam on Followed by PCP.  Pain : No/denies pain     BMI - recorded: 56.3 Nutritional Status: BMI > 30  Obese Nutritional Risks: None Diabetes: No  How often do you need to have someone help you when you read instructions, pamphlets, or other written materials from your doctor or pharmacy?: 1 - Never  Diabetic?  Yes  Interpreter Needed?: No  Information entered by :: Theresa Mulligan LPN   Activities of Daily Living    05/07/2022   11:10 AM 03/01/2022   11:34 AM  In your present state of health, do you have any difficulty performing the following activities:  Hearing? 0 0  Vision? 0 0  Difficulty concentrating or making decisions? 0 1  Walking or climbing stairs? 0 1  Dressing or bathing? 0 1  Doing errands, shopping? 0 1  Preparing Food and eating ? N   Using the Toilet? N   In the past six months, have you accidently leaked urine? Y   Comment Wears pads followed by Urologist   Do you have problems with loss of bowel control? N   Managing your Medications? N   Managing your Finances? N   Housekeeping or managing your Housekeeping? N     Patient Care Team: Carlean Jews, NP as PCP - General (Family Medicine)  Indicate any recent Medical Services you may have received from other than [facility] providers in the past year (date may be approximate).     Assessment:   This is a routine wellness examination for [facility].  Hearing/Vision screen Hearing Screening - Comments:: Denies hearing difficulties   Vision Screening - Comments:: Wears rx glasses - up to date with routine eye exams with  Deferred  Dietary issues and exercise activities discussed: Exercise limited by: orthopedic condition(s)   Goals Addressed  This  Visit's Progress     Lose weight (pt-stated)         Depression Screen    05/07/2022   11:09 AM 03/01/2022   11:33 AM 11/29/2021   10:48 AM 10/23/2021   10:07 AM 07/23/2021   10:06 AM 07/03/2021    4:08 PM 06/27/2021    3:20 PM  PHQ 2/9 Scores  PHQ - 2 Score 0 0 0 0 0 0 0  PHQ- 9 Score  6 2 3 2  3     Fall Risk    05/07/2022   11:11 AM 03/01/2022   11:34 AM 07/23/2021   10:05 AM 07/03/2021    4:08 PM 05/10/2021   11:31 AM  Fall Risk   Falls in the past year? 1 1 1  0 0  Number falls in past yr: 0 1 1  0  Injury with Fall? 0 0 0  0  Risk for fall due to : No Fall Risks    Impaired balance/gait;Impaired mobility;Medication side effect;Orthopedic patient  Follow up Falls prevention discussed    Falls evaluation completed    FALL RISK PREVENTION PERTAINING TO THE HOME:  Any stairs in or around the home? No  If so, are there any without handrails? No  Home free of loose throw rugs in walkways, pet beds, electrical cords, etc? Yes  Adequate lighting in your home to reduce risk of falls? Yes   ASSISTIVE DEVICES UTILIZED TO PREVENT FALLS:  Life alert? No  Use of a cane, walker or w/c? Yes  Grab bars in the bathroom? Yes  Shower chair or bench in shower? Yes  Elevated toilet seat or a handicapped toilet? No   TIMED UP AND GO:  Was the test performed? No . Audio visit   Cognitive Function:        05/07/2022   11:12 AM  6CIT Screen  What Year? 0 points  What month? 0 points  What time? 0 points  Count back from 20 0 points  Months in reverse 0 points  Repeat phrase 0 points  Total Score 0 points    Immunizations Immunization History  Administered Date(s) Administered   Hep A / Hep B 08/13/2021   Influenza,inj,Quad PF,6+ Mos 11/23/2020, 10/11/2021   Moderna Sars-Covid-2 Vaccination 05/31/2019, 06/28/2019, 01/14/2020    TDAP status: Due, Education has been provided regarding the importance of this vaccine. Advised may receive this vaccine at local pharmacy or  Health Dept. Aware to provide a copy of the vaccination record if obtained from local pharmacy or Health Dept. Verbalized acceptance and understanding.  Flu Vaccine status: Up to date    Covid-19 vaccine status: Completed vaccines  Qualifies for Shingles Vaccine? No   Zostavax completed No   Shingrix Completed?: No.    Education has been provided regarding the importance of this vaccine. Patient has been advised to call insurance company to determine out of pocket expense if they have not yet received this vaccine. Advised may also receive vaccine at local pharmacy or Health Dept. Verbalized acceptance and understanding.  Screening Tests Health Maintenance  Topic Date Due   DTaP/Tdap/Td (1 - Tdap) Never done   Diabetic kidney evaluation - Urine ACR  05/23/2022   COVID-19 Vaccine (4 - 2023-24 season) 05/23/2022 (Originally 09/14/2021)   Diabetic kidney evaluation - eGFR measurement  08/06/2022 (Originally 04/17/2022)   Zoster Vaccines- Shingrix (1 of 2) 08/06/2022 (Originally 10/01/2018)   COLONOSCOPY (Pts 45-6yrs Insurance coverage will need to be confirmed)  05/07/2023 (Originally 09/30/2013)  FOOT EXAM  05/23/2022   OPHTHALMOLOGY EXAM  06/21/2022   INFLUENZA VACCINE  08/15/2022   HEMOGLOBIN A1C  08/30/2022   MAMMOGRAM  02/13/2023   Medicare Annual Wellness (AWV)  05/07/2023   PAP SMEAR-Modifier  05/22/2024   Hepatitis C Screening  Completed   HIV Screening  Completed   HPV VACCINES  Aged Out    Health Maintenance  Health Maintenance Due  Topic Date Due   DTaP/Tdap/Td (1 - Tdap) Never done   Diabetic kidney evaluation - Urine ACR  05/23/2022    Colorectal cancer screening: Referral to GI placed 05/07/22. Pt aware the office will call re: appt.  Mammogram status: Completed 02/12/21. Repeat every year    Lung Cancer Screening: (Low Dose CT Chest recommended if Age 70-80 years, 30 pack-year currently smoking OR have quit w/in 15years.) does not qualify.     Additional  Screening:  Hepatitis C Screening: does qualify; Completed 12/12/20  Vision Screening: Recommended annual ophthalmology exams for early detection of glaucoma and other disorders of the eye. Is the patient up to date with their annual eye exam?  Yes  Who is the provider or what is the name of the office in which the patient attends annual eye exams? Deferred If pt is not established with a provider, would they like to be referred to a provider to establish care? No .   Dental Screening: Recommended annual dental exams for proper oral hygiene  Community Resource Referral / Chronic Care Management:  CRR required this visit?  No   CCM required this visit?  No      Plan:     I have personally reviewed and noted the following in the patient's chart:   Medical and social history Use of alcohol, tobacco or illicit drugs  Current medications and supplements including opioid prescriptions. Patient is currently taking opioid prescriptions. Information provided to patient regarding non-opioid alternatives. Patient advised to discuss non-opioid treatment plan with their provider. Functional ability and status Nutritional status Physical activity Advanced directives List of other physicians Hospitalizations, surgeries, and ER visits in previous 12 months Vitals Screenings to include cognitive, depression, and falls Referrals and appointments  In addition, I have reviewed and discussed with patient certain preventive protocols, quality metrics, and best practice recommendations. A written personalized care plan for preventive services as well as general preventive health recommendations were provided to patient.     Tillie Rung, LPN   [id number]/61/0960   Nurse Notes:   Patient due Diabetic kidney evaluation-eGFR measurement

## 2022-05-13 ENCOUNTER — Other Ambulatory Visit: Payer: Self-pay | Admitting: *Deleted

## 2022-05-13 ENCOUNTER — Encounter: Payer: Self-pay | Admitting: Gastroenterology

## 2022-05-13 ENCOUNTER — Telehealth: Payer: Self-pay | Admitting: Pharmacist

## 2022-05-13 ENCOUNTER — Telehealth: Payer: Self-pay | Admitting: *Deleted

## 2022-05-13 DIAGNOSIS — R140 Abdominal distension (gaseous): Secondary | ICD-10-CM

## 2022-05-13 DIAGNOSIS — K219 Gastro-esophageal reflux disease without esophagitis: Secondary | ICD-10-CM

## 2022-05-13 DIAGNOSIS — Z1211 Encounter for screening for malignant neoplasm of colon: Secondary | ICD-10-CM

## 2022-05-13 MED ORDER — NA SULFATE-K SULFATE-MG SULF 17.5-3.13-1.6 GM/177ML PO SOLN: 1.0000 | Freq: Once | ORAL | 0 refills | Status: AC

## 2022-05-13 NOTE — Telephone Encounter (Signed)
Gastroenterology Pre-Procedure Review  Request Date: 06/26/2022 Requesting Physician: Dr. Tobi Bastos  PATIENT REVIEW QUESTIONS: The patient responded to the following health history questions as indicated:    1. Are you having any GI issues? no 2. Do you have a personal history of Polyps? no 3. Do you have a family history of Colon Cancer or Polyps? yes (mother's father had polyps) 4. Diabetes Mellitus? yes (taking semaglutide) 5. Joint replacements in the past 12 months?no 6. Major health problems in the past 3 months?no 7. Any artificial heart valves, MVP, or defibrillator?no    MEDICATIONS & ALLERGIES:    Patient reports the following regarding taking any anticoagulation/antiplatelet therapy:   Plavix, Coumadin, Eliquis, Xarelto, Lovenox, Pradaxa, Brilinta, or Effient? no Aspirin? no  Patient confirms/reports the following medications:  Current Outpatient Medications  Medication Sig Dispense Refill   Na Sulfate-K Sulfate-Mg Sulf 17.5-3.13-1.6 GM/177ML SOLN Take 1 kit by mouth once for 1 dose. 354 mL 0   acyclovir (ZOVIRAX) 400 MG tablet Take 1 tablet (400 mg total) by mouth 2 (two) times daily. 60 tablet 3   amLODipine (NORVASC) 10 MG tablet TAKE 1 TABLET BY MOUTH DAILY 90 tablet 1   atorvastatin (LIPITOR) 10 MG tablet Take 1 tablet (10 mg total) by mouth daily. 90 tablet 3   B Complex-C (SUPER B-C PO) Super B-C     clobetasol cream (TEMOVATE) 0.05 % Apply 1 application. topically 2 (two) times daily. 60 g 3   Cranberry 1000 MG CAPS      cyanocobalamin (VITAMIN B12) 1000 MCG/ML injection Inject 1 mL (1,000 mcg total) into the muscle every 30 (thirty) days. 10 mL 1   esomeprazole (NEXIUM) 40 MG capsule Take 1 capsule (40 mg total) by mouth daily. 90 capsule 0   estradiol (ESTRACE) 0.1 MG/GM vaginal cream Estrogen Cream Instruction Discard applicator Apply pea sized amount to tip of finger to urethra before bed. Wash hands well after application. Use Monday, Wednesday and Friday 42.5 g 11    estradiol (VIVELLE-DOT) 0.0375 MG/24HR Place 1 patch onto the skin 2 (two) times a week. 24 patch 2   fenofibrate (TRICOR) 145 MG tablet TAKE 1 TABLET BY MOUTH EVERY DAY WITH DINNER 90 tablet 1   furosemide (LASIX) 20 MG tablet Take 20 mg by mouth as needed.     HYDROcodone-acetaminophen (NORCO/VICODIN) 5-325 MG tablet Take 1 tablet by mouth every 8 (eight) hours as needed.     ibuprofen (ADVIL) 600 MG tablet Take 600 mg by mouth 3 (three) times daily as needed.     ketoconazole (NIZORAL) 2 % cream Apply topically 2 (two) times daily.     Lidocaine (BLUE-EMU PAIN RELIEF DRY EX) Apply topically.     losartan (COZAAR) 100 MG tablet TAKE 1 TABLET(100 MG) BY MOUTH DAILY 90 tablet 1   metoprolol tartrate (LOPRESSOR) 25 MG tablet Take 25 mg by mouth.     pregabalin (LYRICA) 225 MG capsule Take 225 mg by mouth 3 (three) times daily.     progesterone (PROMETRIUM) 100 MG capsule Take 1 capsule (100 mg total) by mouth daily. 90 capsule 3   Semaglutide, 1 MG/DOSE, 4 MG/3ML SOPN Inject 1 mg as directed once a week. 3 mL 2   tiZANidine (ZANAFLEX) 2 MG tablet Take by mouth every 6 (six) hours as needed for muscle spasms.     Tocilizumab (ACTEMRA IV) Inject into the vein.     Vibegron (GEMTESA) 75 MG TABS Take 1 tablet (75 mg total) by mouth daily. 30 tablet  11   No current facility-administered medications for this visit.    Patient confirms/reports the following allergies:  Allergies  Allergen Reactions   Alcohol Anaphylaxis   Dilantin [Phenytoin] Anaphylaxis    No orders of the defined types were placed in this encounter.   AUTHORIZATION INFORMATION Primary Insurance: 1D#: Group #:  Secondary Insurance: 1D#: Group #:  SCHEDULE INFORMATION: Date: 06/26/2022 Time: Location:  [facility]

## 2022-05-13 NOTE — Progress Notes (Signed)
Patient appearing on report for quality metrics.  Outreached patient to discuss medication management. Left voicemail for patient to return my call at their convenience.   Jenilyn Magana, PharmD, BCPS Clinical Pharmacist [facility]  

## 2022-05-14 DIAGNOSIS — M0609 Rheumatoid arthritis without rheumatoid factor, multiple sites: Secondary | ICD-10-CM | POA: Diagnosis not present

## 2022-05-14 DIAGNOSIS — Z79899 Other long term (current) drug therapy: Secondary | ICD-10-CM | POA: Diagnosis not present

## 2022-05-14 NOTE — Telephone Encounter (Signed)
yes

## 2022-05-15 DIAGNOSIS — M170 Bilateral primary osteoarthritis of knee: Secondary | ICD-10-CM | POA: Diagnosis not present

## 2022-05-16 ENCOUNTER — Other Ambulatory Visit: Payer: Self-pay | Admitting: Anesthesiology

## 2022-05-16 DIAGNOSIS — M5412 Radiculopathy, cervical region: Secondary | ICD-10-CM

## 2022-05-17 ENCOUNTER — Encounter: Payer: Self-pay | Admitting: Nurse Practitioner

## 2022-05-17 ENCOUNTER — Other Ambulatory Visit: Payer: Self-pay

## 2022-05-17 DIAGNOSIS — I10 Essential (primary) hypertension: Secondary | ICD-10-CM

## 2022-05-17 DIAGNOSIS — E669 Obesity, unspecified: Secondary | ICD-10-CM

## 2022-05-17 DIAGNOSIS — G629 Polyneuropathy, unspecified: Secondary | ICD-10-CM

## 2022-05-17 MED ORDER — LOSARTAN POTASSIUM 100 MG PO TABS
ORAL_TABLET | ORAL | 1 refills | Status: DC
Start: 2022-05-17 — End: 2022-11-12

## 2022-05-20 DIAGNOSIS — M5136 Other intervertebral disc degeneration, lumbar region: Secondary | ICD-10-CM | POA: Diagnosis not present

## 2022-05-20 DIAGNOSIS — M0609 Rheumatoid arthritis without rheumatoid factor, multiple sites: Secondary | ICD-10-CM | POA: Diagnosis not present

## 2022-05-20 DIAGNOSIS — M1991 Primary osteoarthritis, unspecified site: Secondary | ICD-10-CM | POA: Diagnosis not present

## 2022-05-20 DIAGNOSIS — L403 Pustulosis palmaris et plantaris: Secondary | ICD-10-CM | POA: Diagnosis not present

## 2022-05-21 ENCOUNTER — Other Ambulatory Visit: Payer: Self-pay

## 2022-05-23 NOTE — Addendum Note (Signed)
Addended by: Adela Ports on: 05/23/2022 02:58 PM   Modules accepted: Orders

## 2022-05-27 ENCOUNTER — Telehealth: Payer: Self-pay

## 2022-05-27 NOTE — Telephone Encounter (Signed)
Called patient and explain to patient that there is no extra prepping for EGD

## 2022-05-27 NOTE — Telephone Encounter (Signed)
Pt LM has question about endoscopy in ref to prep please return call

## 2022-05-28 DIAGNOSIS — F411 Generalized anxiety disorder: Secondary | ICD-10-CM | POA: Diagnosis not present

## 2022-05-31 ENCOUNTER — Ambulatory Visit: Payer: Medicare Other | Admitting: Nurse Practitioner

## 2022-06-06 ENCOUNTER — Ambulatory Visit (INDEPENDENT_AMBULATORY_CARE_PROVIDER_SITE_OTHER): Payer: Medicare Other | Admitting: Family Medicine

## 2022-06-06 ENCOUNTER — Encounter: Payer: Self-pay | Admitting: Family Medicine

## 2022-06-06 VITALS — BP 102/58 | HR 63 | Temp 98.6°F | Ht 64.0 in | Wt 328.0 lb

## 2022-06-06 DIAGNOSIS — E119 Type 2 diabetes mellitus without complications: Secondary | ICD-10-CM

## 2022-06-06 DIAGNOSIS — Z794 Long term (current) use of insulin: Secondary | ICD-10-CM

## 2022-06-06 DIAGNOSIS — F411 Generalized anxiety disorder: Secondary | ICD-10-CM | POA: Diagnosis not present

## 2022-06-06 LAB — POCT GLYCOSYLATED HEMOGLOBIN (HGB A1C): HbA1c, POC (controlled diabetic range): 5.1 % (ref 0.0–7.0)

## 2022-06-06 MED ORDER — SEMAGLUTIDE (2 MG/DOSE) 8 MG/3ML ~~LOC~~ SOPN: 2.0000 mg | PEN_INJECTOR | SUBCUTANEOUS | 4 refills | Status: DC

## 2022-06-06 NOTE — Patient Instructions (Signed)
He was nice to meet you today,  I would like you to continue using the 1 mg dose of Ozempic daily until you have your endoscopy.  After that time you can increase to the 2 mg dose.  I have already sent in a prescription for the change.  Continue to use your existing pens until that time.  I like to see you back in 3 months.  We can recheck your A1c at that time.  If you need to be seen sooner than that just let us know.  Have a great day,  Frederic Jericho, MD

## 2022-06-06 NOTE — Assessment & Plan Note (Signed)
Improved A1c. Will increase to 2 mg Ozempic dose to start after Endoscopy, pending results. Follow-up with me in 3 months.

## 2022-06-06 NOTE — Progress Notes (Signed)
   Established Patient Office Visit  Subjective   Patient ID: ALHELI ALVA, female    DOB: 1970-06-09  Age: 52 y.o. MRN: [id number]  Chief Complaint  Patient presents with   Diabetes    HPI  Diabetes - doesn't eat  after 730pm or before 1130am.  Does calorie restriction as well.  New favorite food is Education officer, environmental.   Patient has been increasing her exercise recently prior to that she had had difficulty exercising due to the issues with rheumatoid arthritis, the knee injections and back injections she gets.  Has an MRI of her spine coming up soon.  Sees an orthopedist as well as a pain management doctor.  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  Patient continues to take her 1 mg of Ozempic and feels like it is not as effective as it was before if she measures this by how hungry she feels.  She feels like she is more hungry recently.  She would like to know she can go up to the 2 mg dose.  She has an endoscopy scheduled for June 12 and they would like her to hold her Ozempic for 1 week prior to that.     ROS    Objective:     BP (!) 102/58   Pulse 63   Temp 98.6 F (37 C) (Oral)   Ht 5\' 4"  (1.626 m)   Wt (!) 328 lb (148.8 kg)   SpO2 97%   BMI 56.30 kg/m    Physical Exam General: Alert and oriented Pulmonary: No respiratory distress MSK: Ambulates with cane.   Results for orders placed or performed in visit on 06/06/22  POCT glycosylated hemoglobin (Hb A1C)  Result Value Ref Range   Hemoglobin A1C     HbA1c POC (<> result, manual entry)     HbA1c, POC (prediabetic range)     HbA1c, POC (controlled diabetic range) 5.1 0.0 - 7.0 %      The 10-year ASCVD risk score (Arnett DK, et al., 2019) is: 1.7%    Assessment & Plan:   Problem List Items Addressed This Visit       Endocrine   Type 2 diabetes mellitus without complication, with long-term current use of insulin (HCC)    Improved A1c. Will increase to 2  mg Ozempic dose to start after Endoscopy, pending results. Follow-up with me in 3 months.      Relevant Medications   Semaglutide, 2 MG/DOSE, 8 MG/3ML SOPN   Other Visit Diagnoses     Type 2 diabetes mellitus without complication, without long-term current use of insulin (HCC)    -  Primary   Relevant Medications   Semaglutide, 2 MG/DOSE, 8 MG/3ML SOPN   Other Relevant Orders   POCT glycosylated hemoglobin (Hb A1C) (Completed)       Return in about 3 months (around 09/06/2022).    Sandre Kitty, MD

## 2022-06-07 ENCOUNTER — Encounter: Payer: Self-pay | Admitting: Family Medicine

## 2022-06-07 DIAGNOSIS — M064 Inflammatory polyarthropathy: Secondary | ICD-10-CM

## 2022-06-08 MED ORDER — PREDNISONE 10 MG (21) PO TBPK
ORAL_TABLET | ORAL | 0 refills | Status: DC
Start: 2022-06-08 — End: 2022-07-19

## 2022-06-08 NOTE — Telephone Encounter (Signed)
Pt called online nurse hotline requesting prednisone for RA flare. She said her PCP had sent this in before for her. Reviewed chart and sent what PcP had sent before for RA flare.  Called patient and made aware of prescription.

## 2022-06-09 ENCOUNTER — Ambulatory Visit
Admission: RE | Admit: 2022-06-09 | Discharge: 2022-06-09 | Disposition: A | Discharge status: Home / Self Care | Payer: Medicare Other | Source: Ambulatory Visit | Attending: Anesthesiology | Admitting: Anesthesiology

## 2022-06-09 DIAGNOSIS — M5412 Radiculopathy, cervical region: Secondary | ICD-10-CM

## 2022-06-09 DIAGNOSIS — M5030 Other cervical disc degeneration, unspecified cervical region: Secondary | ICD-10-CM | POA: Diagnosis not present

## 2022-06-11 ENCOUNTER — Other Ambulatory Visit: Payer: Self-pay

## 2022-06-11 ENCOUNTER — Other Ambulatory Visit: Payer: Self-pay | Admitting: Gastroenterology

## 2022-06-11 DIAGNOSIS — I10 Essential (primary) hypertension: Secondary | ICD-10-CM

## 2022-06-11 MED ORDER — AMLODIPINE BESYLATE 10 MG PO TABS
ORAL_TABLET | ORAL | 1 refills | Status: DC
Start: 2022-06-11 — End: 2023-02-26

## 2022-06-12 ENCOUNTER — Telehealth: Payer: Self-pay | Admitting: *Deleted

## 2022-06-12 DIAGNOSIS — M0609 Rheumatoid arthritis without rheumatoid factor, multiple sites: Secondary | ICD-10-CM | POA: Diagnosis not present

## 2022-06-12 NOTE — Telephone Encounter (Signed)
LVM for pt to call office back, received fax that she had called the after hours line and wanted to check to see if she needed to schedule an appointment or if she was able to get what she needed.

## 2022-06-13 DIAGNOSIS — F411 Generalized anxiety disorder: Secondary | ICD-10-CM | POA: Diagnosis not present

## 2022-06-20 DIAGNOSIS — F411 Generalized anxiety disorder: Secondary | ICD-10-CM | POA: Diagnosis not present

## 2022-06-26 ENCOUNTER — Encounter
Admission: RE | Disposition: A | Discharge status: Home / Self Care | Payer: Self-pay | Source: Home / Self Care | Attending: Gastroenterology

## 2022-06-26 ENCOUNTER — Ambulatory Visit
Admission: RE | Admit: 2022-06-26 | Discharge: 2022-06-26 | Disposition: A | Discharge status: Home / Self Care | Payer: Medicare Other | Attending: Gastroenterology | Admitting: Gastroenterology

## 2022-06-26 ENCOUNTER — Encounter: Payer: Self-pay | Admitting: Gastroenterology

## 2022-06-26 ENCOUNTER — Ambulatory Visit: Payer: Medicare Other | Admitting: Anesthesiology

## 2022-06-26 DIAGNOSIS — Z1211 Encounter for screening for malignant neoplasm of colon: Secondary | ICD-10-CM | POA: Diagnosis not present

## 2022-06-26 DIAGNOSIS — K3184 Gastroparesis: Secondary | ICD-10-CM

## 2022-06-26 DIAGNOSIS — Z6841 Body Mass Index (BMI) 40.0 and over, adult: Secondary | ICD-10-CM | POA: Insufficient documentation

## 2022-06-26 DIAGNOSIS — Z09 Encounter for follow-up examination after completed treatment for conditions other than malignant neoplasm: Secondary | ICD-10-CM | POA: Insufficient documentation

## 2022-06-26 DIAGNOSIS — Z87891 Personal history of nicotine dependence: Secondary | ICD-10-CM | POA: Insufficient documentation

## 2022-06-26 DIAGNOSIS — Z90710 Acquired absence of both cervix and uterus: Secondary | ICD-10-CM | POA: Insufficient documentation

## 2022-06-26 DIAGNOSIS — K317 Polyp of stomach and duodenum: Secondary | ICD-10-CM | POA: Insufficient documentation

## 2022-06-26 DIAGNOSIS — E119 Type 2 diabetes mellitus without complications: Secondary | ICD-10-CM | POA: Insufficient documentation

## 2022-06-26 DIAGNOSIS — K219 Gastro-esophageal reflux disease without esophagitis: Secondary | ICD-10-CM | POA: Diagnosis not present

## 2022-06-26 HISTORY — PX: COLONOSCOPY WITH PROPOFOL: SHX5780

## 2022-06-26 HISTORY — PX: ESOPHAGOGASTRODUODENOSCOPY (EGD) WITH PROPOFOL: SHX5813

## 2022-06-26 SURGERY — COLONOSCOPY WITH PROPOFOL
Anesthesia: General

## 2022-06-26 SURGERY — ESOPHAGOGASTRODUODENOSCOPY (EGD) WITH PROPOFOL
Anesthesia: General

## 2022-06-26 MED ORDER — PROPOFOL 500 MG/50ML IV EMUL
INTRAVENOUS | Status: DC | PRN
  Administered 2022-06-26: 100 ug/kg/min via INTRAVENOUS

## 2022-06-26 MED ORDER — DEXMEDETOMIDINE HCL IN NACL 80 MCG/20ML IV SOLN
INTRAVENOUS | Status: AC
  Filled 2022-06-26: qty 20

## 2022-06-26 MED ORDER — GLYCOPYRROLATE 0.2 MG/ML IJ SOLN
INTRAMUSCULAR | Status: DC | PRN
  Administered 2022-06-26: .1 mg via INTRAVENOUS

## 2022-06-26 MED ORDER — LIDOCAINE HCL (CARDIAC) PF 100 MG/5ML IV SOSY
PREFILLED_SYRINGE | INTRAVENOUS | Status: DC | PRN
  Administered 2022-06-26: 50 mg via INTRAVENOUS

## 2022-06-26 MED ORDER — PROPOFOL 1000 MG/100ML IV EMUL
INTRAVENOUS | Status: AC
  Filled 2022-06-26: qty 100

## 2022-06-26 MED ORDER — DEXMEDETOMIDINE HCL IN NACL 80 MCG/20ML IV SOLN
INTRAVENOUS | Status: DC | PRN
  Administered 2022-06-26: 20 ug via INTRAVENOUS
  Administered 2022-06-26: 12 ug via INTRAVENOUS
  Administered 2022-06-26: 8 ug via INTRAVENOUS

## 2022-06-26 MED ORDER — SODIUM CHLORIDE 0.9 % IV SOLN
INTRAVENOUS | Status: DC
  Administered 2022-06-26: 1000 mL via INTRAVENOUS

## 2022-06-26 MED ORDER — LIDOCAINE HCL (PF) 2 % IJ SOLN
INTRAMUSCULAR | Status: AC
  Filled 2022-06-26: qty 5

## 2022-06-26 MED ORDER — PROPOFOL 10 MG/ML IV BOLUS
INTRAVENOUS | Status: DC | PRN
  Administered 2022-06-26: 10 mg via INTRAVENOUS
  Administered 2022-06-26: 40 mg via INTRAVENOUS
  Administered 2022-06-26: 30 mg via INTRAVENOUS
  Administered 2022-06-26: 70 mg via INTRAVENOUS

## 2022-06-26 MED ORDER — GLYCOPYRROLATE 0.2 MG/ML IJ SOLN
INTRAMUSCULAR | Status: AC
  Filled 2022-06-26: qty 1

## 2022-06-26 NOTE — Anesthesia Postprocedure Evaluation (Signed)
Anesthesia Post Note  Patient: Elizabeth Patrick  Procedure(s) Performed: COLONOSCOPY WITH PROPOFOL ESOPHAGOGASTRODUODENOSCOPY (EGD) WITH PROPOFOL  Patient location during evaluation: PACU Anesthesia Type: General Level of consciousness: awake and alert, oriented and patient cooperative Pain management: pain level controlled Vital Signs Assessment: post-procedure vital signs reviewed and stable Respiratory status: spontaneous breathing, nonlabored ventilation and respiratory function stable Cardiovascular status: blood pressure returned to baseline and stable Postop Assessment: adequate PO intake Anesthetic complications: no   No notable events documented.   Last Vitals:  Vitals:   06/26/22 0813 06/26/22 0820  BP: (!) 101/50 106/68  Pulse:    Resp:    Temp: (!) 35.7 C   SpO2:      Last Pain:  Vitals:   06/26/22 0839  TempSrc:   PainSc: 0-No pain                 Reed Breech

## 2022-06-26 NOTE — H&P (Signed)
Wyline Mood, MD [address], [place], [place], [address] [address], [address], [place], [place], [address] Phone: [phone]  Fax: [phone]  Primary Care Physician:  Carlean Jews, NP   Pre-Procedure History & Physical: HPI:  Elizabeth Patrick is a 52 y.o. female is here for an endoscopy and colonoscopy    Past Medical History:  Diagnosis Date   Abnormal uterine bleeding    Anemia    Blood transfusion without reported diagnosis    Diabetes mellitus without complication (HCC)    Dysmenorrhea    Endometriosis    Fibroid    Genital warts    RA (rheumatoid arthritis) (HCC)     Past Surgical History:  Procedure Laterality Date   ABDOMINAL HYSTERECTOMY     PARATHYROIDECTOMY      Prior to Admission medications   Medication Sig Start Date End Date Taking? Authorizing Provider  acyclovir (ZOVIRAX) 400 MG tablet Take 1 tablet (400 mg total) by mouth 2 (two) times daily. 11/29/21  Yes Boscia, Heather E, NP  amLODipine (NORVASC) 10 MG tablet TAKE 1 TABLET BY MOUTH DAILY 06/11/22  Yes Boscia, Heather E, NP  atorvastatin (LIPITOR) 10 MG tablet Take 1 tablet (10 mg total) by mouth daily. 09/11/21  Yes Boscia, Kathlynn Grate, NP  B Complex-C (SUPER B-C PO) Super B-C   Yes [provider]  clobetasol cream (TEMOVATE) 0.05 % Apply 1 application. topically 2 (two) times daily. 05/22/21  Yes Carlean Jews, NP  Cranberry 1000 MG CAPS  12/14/20  Yes [provider]  esomeprazole (NEXIUM) 40 MG capsule TAKE 1 CAPSULE(40 MG) BY MOUTH DAILY 06/11/22  Yes Wyline Mood, MD  estradiol (ESTRACE) 0.1 MG/GM vaginal cream Estrogen Cream Instruction Discard applicator Apply pea sized amount to tip of finger to urethra before bed. Wash hands well after application. Use Monday, Wednesday and Friday 02/06/22  Yes Sondra Come, MD  estradiol (VIVELLE-DOT) 0.0375 MG/24HR Place 1 patch onto the skin 2 (two) times a week. 10/08/21  Yes Romualdo Bolk, MD  fenofibrate  (TRICOR) 145 MG tablet TAKE 1 TABLET BY MOUTH EVERY DAY WITH DINNER 02/27/22  Yes Boscia, Kathlynn Grate, NP  ketoconazole (NIZORAL) 2 % cream Apply topically 2 (two) times daily. 09/06/21  Yes [provider]  Lidocaine (BLUE-EMU PAIN RELIEF DRY EX) Apply topically.   Yes [provider]  losartan (COZAAR) 100 MG tablet TAKE 1 TABLET(100 MG) BY MOUTH DAILY 05/17/22  Yes Boscia, Heather E, NP  metoprolol tartrate (LOPRESSOR) 25 MG tablet Take 25 mg by mouth.   Yes [provider]  pregabalin (LYRICA) 225 MG capsule Take 225 mg by mouth 3 (three) times daily. 01/24/22  Yes [provider]  progesterone (PROMETRIUM) 100 MG capsule Take 1 capsule (100 mg total) by mouth daily. 08/22/21  Yes Romualdo Bolk, MD  tiZANidine (ZANAFLEX) 2 MG tablet Take by mouth every 6 (six) hours as needed for muscle spasms.   Yes [provider]  Tocilizumab (ACTEMRA IV) Inject into the vein.   Yes [provider]  furosemide (LASIX) 20 MG tablet Take 20 mg by mouth as needed.    [provider]  HYDROcodone-acetaminophen (NORCO/VICODIN) 5-325 MG tablet Take 1 tablet by mouth every 8 (eight) hours as needed. 07/19/21   [provider]  ibuprofen (ADVIL) 600 MG tablet Take 600 mg by mouth 3 (three) times daily as needed.    [provider]  predniSONE (STERAPRED UNI-PAK 21 TAB)  10 MG (21) TBPK tablet 6 day taper - take by mouth as directed for 6 days Patient not taking: Reported on 06/26/2022 06/08/22   Jomarie Longs, PA-C  Semaglutide, 2 MG/DOSE, 8 MG/3ML SOPN Inject 2 mg as directed once a week. 06/06/22   Sandre Kitty, MD  Vibegron (GEMTESA) 75 MG TABS Take 1 tablet (75 mg total) by mouth daily. 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   Dilantin [phenytoin] Anaphylaxis 09/26/2020    Family  History  Problem Relation Age of Onset   Obesity Mother    Coronary artery disease Mother    Bipolar disorder Mother    Multiple sclerosis Mother    Hypertension Father    Heart attack Father    Vascular Disease Father    Breast cancer Maternal Grandmother 46    Social History   Socioeconomic History   Marital status: Married    Spouse name: Not on file   Number of children: Not on file   Years of education: Not on file   Highest education level: Not on file  Occupational History   Not on file  Tobacco Use   Smoking status: Former    Packs/day: 1.00    Years: 10.00    Additional pack years: 0.00    Total pack years: 10.00    Types: Cigarettes    Quit date: 01/14/2005    Years since quitting: 17.4    Passive exposure: Past   Smokeless tobacco: Never  Vaping Use   Vaping Use: Never used  Substance and Sexual Activity   Alcohol use: Not Currently   Drug use: Never   Sexual activity: Yes    Partners: Male    Birth control/protection: Surgical  Other Topics Concern   Not on file  Social History Narrative   Not on file   Social Determinants of Health   Financial Resource Strain: Low Risk  (05/07/2022)   Overall Financial Resource Strain (CARDIA)    Difficulty of Paying Living Expenses: Not hard at all  Food Insecurity: No Food Insecurity (05/07/2022)   Hunger Vital Sign    Worried About Running Out of Food in the Last Year: Never true    Ran Out of Food in the Last Year: Never true  Transportation Needs: No Transportation Needs (05/07/2022)   PRAPARE - Administrator, Civil Service (Medical): No    Lack of Transportation (Non-Medical): No  Physical Activity: Inactive (05/07/2022)   Exercise Vital Sign    Days of Exercise per Week: 0 days    Minutes of Exercise per Session: 0 min  Stress: No Stress Concern Present (05/07/2022)   [employer] of Occupational Health - Occupational Stress Questionnaire    Feeling of Stress : Not at all  Social  Connections: Moderately Isolated (05/07/2022)   Social Connection and Isolation Panel [NHANES]    Frequency of Communication with Friends and Family: More than three times a week    Frequency of Social Gatherings with Friends and Family: More than three times a week    Attends Religious Services: Never    Database administrator or Organizations: No    Attends Banker Meetings: Never    Marital Status: Married  Catering manager Violence: Not At Risk (05/07/2022)   Humiliation, Afraid, Rape, and Kick questionnaire    Fear  of Current or Ex-Partner: No    Emotionally Abused: No    Physically Abused: No    Sexually Abused: No    Review of Systems: See HPI, otherwise negative ROS  Physical Exam: BP 125/73   Pulse (!) 55   Temp 97.8 F (36.6 C) (Temporal)   Resp 18   Ht 5\' 4"  (1.626 m)   Wt (!) 151.5 kg   SpO2 100%   BMI 57.31 kg/m  General:   Alert,  pleasant and cooperative in NAD Head:  Normocephalic and atraumatic. Neck:  Supple; no masses or thyromegaly. Lungs:  Clear throughout to auscultation, normal respiratory effort.    Heart:  +S1, +S2, Regular rate and rhythm, No edema. Abdomen:  Soft, nontender and nondistended. Normal bowel sounds, without guarding, and without rebound.   Neurologic:  Alert and  oriented x4;  grossly normal neurologically.  Impression/Plan: Elizabeth Patrick is here for an endoscopy and colonoscopy  to be performed for  evaluation of vomiting and colon cancer screening     Risks, benefits, limitations, and alternatives regarding endoscopy have been reviewed with the patient.  Questions have been answered.  All parties agreeable.   Wyline Mood, MD  06/26/2022, 7:32 AM

## 2022-06-26 NOTE — Anesthesia Preprocedure Evaluation (Addendum)
Anesthesia Evaluation  Patient identified by MRN, date of birth, ID band Patient awake    Reviewed: Allergy & Precautions, NPO status , Patient's Chart, lab work & pertinent test results  History of Anesthesia Complications (+) Emergence Delirium and history of anesthetic complications  Airway Mallampati: IV   Neck ROM: Full    Dental  (+) Missing   Pulmonary former smoker (quit 2007)   Pulmonary exam normal breath sounds clear to auscultation       Cardiovascular Normal cardiovascular exam Rhythm:Regular Rate:Normal     Neuro/Psych  Neuromuscular disease (neuropathy)    GI/Hepatic ,GERD  ,,  Endo/Other  diabetes, Type 2  Class 3 obesity  Renal/GU      Musculoskeletal   Abdominal   Peds  Hematology  (+) Blood dyscrasia, anemia   Anesthesia Other Findings Last dose of Semaglutide 9 days ago.  Reproductive/Obstetrics Endometriosis and fibroids s/p hysterectomy                             Anesthesia Physical Anesthesia Plan  ASA: 3  Anesthesia Plan: General   Post-op Pain Management:    Induction: Intravenous  PONV Risk Score and Plan: 3 and Propofol infusion, TIVA and Treatment may vary due to age or medical condition  Airway Management Planned: Natural Airway  Additional Equipment:   Intra-op Plan:   Post-operative Plan:   Informed Consent: I have reviewed the patients History and Physical, chart, labs and discussed the procedure including the risks, benefits and alternatives for the proposed anesthesia with the patient or authorized representative who has indicated his/her understanding and acceptance.       Plan Discussed with: CRNA  Anesthesia Plan Comments: (LMA/GETA backup discussed.  Patient consented for risks of anesthesia including but not limited to:  - adverse reactions to medications - damage to eyes, teeth, lips or other oral mucosa - nerve damage due to  positioning  - sore throat or hoarseness - damage to heart, brain, nerves, lungs, other parts of body or loss of life  Informed patient about role of CRNA in peri- and intra-operative care.  Patient voiced understanding.)       Anesthesia Quick Evaluation

## 2022-06-26 NOTE — Op Note (Signed)
[facility] Gastroenterology Patient Name: Elizabeth Patrick Procedure Date: 06/26/2022 7:21 AM MRN: [id number] Account #: [id number] Date of Birth: 1970-05-23 Admit Type: Outpatient Age: 52 Room: [facility] ENDO ROOM 3 Gender: Female Note Status: Finalized Instrument Name: Upper Endoscope [id number] Procedure:             Upper GI endoscopy Indications:           Suspected gastroparesis Providers:             Wyline Mood MD, MD Referring MD:          No Local Md, MD (Referring MD) Medicines:             Monitored Anesthesia Care Complications:         No immediate complications. Procedure:             Pre-Anesthesia Assessment:                        - Prior to the procedure, a History and Physical was                         performed, and patient medications, allergies and                         sensitivities were reviewed. The patient's tolerance                         of previous anesthesia was reviewed.                        - The risks and benefits of the procedure and the                         sedation options and risks were discussed with the                         patient. All questions were answered and informed                         consent was obtained.                        - ASA Grade Assessment: II - A patient with mild                         systemic disease.                        After obtaining informed consent, the endoscope was                         passed under direct vision. Throughout the procedure,                         the patient's blood pressure, pulse, and oxygen                         saturations were monitored continuously. The Endoscope                         was  introduced through the mouth, and advanced to the                         third part of duodenum. The upper GI endoscopy was                         accomplished with ease. The patient tolerated the                         procedure well. Findings:      The esophagus  was normal.      The examined duodenum was normal.      The cardia and gastric fundus were normal on retroflexion.      Multiple small sessile polyps with no bleeding and no stigmata of recent       bleeding were found on the greater curvature of the stomach. Impression:            - Normal esophagus.                        - Normal examined duodenum.                        - Multiple gastric polyps.                        - No specimens collected. Recommendation:        - Perform a colonoscopy today. Procedure Code(s):     --- Professional ---                        [phone], Esophagogastroduodenoscopy, flexible,                         transoral; diagnostic, including collection of                         specimen(s) by brushing or washing, when performed                         (separate procedure) Diagnosis Code(s):     --- Professional ---                        K31.7, Polyp of stomach and duodenum CPT copyright 2022 American Medical Association. All rights reserved. The codes documented in this report are preliminary and upon coder review may  be revised to meet current compliance requirements. Wyline Mood, MD Wyline Mood MD, MD 06/26/2022 7:54:48 AM This report has been signed electronically. Number of Addenda: 0 Note Initiated On: 06/26/2022 7:21 AM Estimated Blood Loss:  Estimated blood loss: none.      [facility]

## 2022-06-26 NOTE — Op Note (Signed)
[facility] Gastroenterology Patient Name: Elizabeth Patrick Procedure Date: 06/26/2022 7:20 AM MRN: [id number] Account #: [id number] Date of Birth: 09-Feb-1970 Admit Type: Outpatient Age: 52 Room: [facility] ENDO ROOM 3 Gender: Female Note Status: Finalized Instrument Name: Prentice Docker [id number] Procedure:             Colonoscopy Indications:           Screening for colorectal malignant neoplasm Providers:             Wyline Mood MD, MD Referring MD:          No Local Md, MD (Referring MD) Medicines:             Monitored Anesthesia Care Complications:         No immediate complications. Procedure:             Pre-Anesthesia Assessment:                        - Prior to the procedure, a History and Physical was                         performed, and patient medications, allergies and                         sensitivities were reviewed. The patient's tolerance                         of previous anesthesia was reviewed.                        - The risks and benefits of the procedure and the                         sedation options and risks were discussed with the                         patient. All questions were answered and informed                         consent was obtained.                        - ASA Grade Assessment: II - A patient with mild                         systemic disease.                        After obtaining informed consent, the colonoscope was                         passed under direct vision. Throughout the procedure,                         the patient's blood pressure, pulse, and oxygen                         saturations were monitored continuously. The                         Colonoscope was  introduced through the anus and                         advanced to the the cecum, identified by the                         appendiceal orifice. The colonoscopy was performed                         with ease. The patient tolerated the procedure well.                          The quality of the bowel preparation was good. The                         ileocecal valve, appendiceal orifice, and rectum were                         photographed. Findings:      The perianal and digital rectal examinations were normal.      The entire examined colon appeared normal on direct and retroflexion       views. Impression:            - The entire examined colon is normal on direct and                         retroflexion views.                        - No specimens collected. Recommendation:        - Discharge patient to home (with escort).                        - Resume previous diet.                        - Continue present medications.                        - Repeat colonoscopy in 10 years for screening                         purposes. Procedure Code(s):     --- Professional ---                        [phone], Colonoscopy, flexible; diagnostic, including                         collection of specimen(s) by brushing or washing, when                         performed (separate procedure) Diagnosis Code(s):     --- Professional ---                        Z12.11, Encounter for screening for malignant neoplasm                         of colon CPT copyright 2022 American Medical Association. All rights reserved. The codes documented in this  report are preliminary and upon coder review may  be revised to meet current compliance requirements. Wyline Mood, MD Wyline Mood MD, MD 06/26/2022 8:08:20 AM This report has been signed electronically. Number of Addenda: 0 Note Initiated On: 06/26/2022 7:20 AM Scope Withdrawal Time: 0 hours 8 minutes 57 seconds  Total Procedure Duration: 0 hours 11 minutes 25 seconds  Estimated Blood Loss:  Estimated blood loss: none.      [facility]

## 2022-06-26 NOTE — Transfer of Care (Signed)
Immediate Anesthesia Transfer of Care Note  Patient: Elizabeth Patrick  Procedure(s) Performed: COLONOSCOPY WITH PROPOFOL ESOPHAGOGASTRODUODENOSCOPY (EGD) WITH PROPOFOL  Patient Location: PACU and Endoscopy Unit  Anesthesia Type:General  Level of Consciousness: drowsy and responds to stimulation  Airway & Oxygen Therapy: Patient Spontanous Breathing  Post-op Assessment: Report given to RN and Post -op Vital signs reviewed and stable  Post vital signs: Reviewed and stable  Last Vitals:  Vitals Value Taken Time  BP 77/34 06/26/22 0812  Temp    Pulse 52 06/26/22 0812  Resp 16 06/26/22 0812  SpO2 96 % 06/26/22 0812  Vitals shown include unvalidated device data.  Last Pain:  Vitals:   06/26/22 0809  TempSrc:   PainSc: 0-No pain         Complications: No notable events documented.

## 2022-06-27 ENCOUNTER — Encounter: Payer: Self-pay | Admitting: Gastroenterology

## 2022-06-27 ENCOUNTER — Encounter: Payer: Self-pay | Admitting: Nurse Practitioner

## 2022-07-02 NOTE — Telephone Encounter (Signed)
Do you know if this was done and sent to [facility]? I did not see her at her last visit.

## 2022-07-04 DIAGNOSIS — F411 Generalized anxiety disorder: Secondary | ICD-10-CM | POA: Diagnosis not present

## 2022-07-09 DIAGNOSIS — F411 Generalized anxiety disorder: Secondary | ICD-10-CM | POA: Diagnosis not present

## 2022-07-10 DIAGNOSIS — M5412 Radiculopathy, cervical region: Secondary | ICD-10-CM | POA: Diagnosis not present

## 2022-07-10 DIAGNOSIS — M0609 Rheumatoid arthritis without rheumatoid factor, multiple sites: Secondary | ICD-10-CM | POA: Diagnosis not present

## 2022-07-10 DIAGNOSIS — Z79891 Long term (current) use of opiate analgesic: Secondary | ICD-10-CM | POA: Diagnosis not present

## 2022-07-15 ENCOUNTER — Other Ambulatory Visit: Payer: Self-pay | Admitting: Anesthesiology

## 2022-07-15 DIAGNOSIS — M5412 Radiculopathy, cervical region: Secondary | ICD-10-CM

## 2022-07-16 DIAGNOSIS — M180 Bilateral primary osteoarthritis of first carpometacarpal joints: Secondary | ICD-10-CM | POA: Diagnosis not present

## 2022-07-16 DIAGNOSIS — M79645 Pain in left finger(s): Secondary | ICD-10-CM | POA: Diagnosis not present

## 2022-07-16 DIAGNOSIS — M67431 Ganglion, right wrist: Secondary | ICD-10-CM | POA: Diagnosis not present

## 2022-07-17 DIAGNOSIS — M25511 Pain in right shoulder: Secondary | ICD-10-CM | POA: Diagnosis not present

## 2022-07-19 ENCOUNTER — Telehealth: Payer: Self-pay

## 2022-07-19 ENCOUNTER — Telehealth: Payer: Self-pay | Admitting: Nurse Practitioner

## 2022-07-19 DIAGNOSIS — G629 Polyneuropathy, unspecified: Secondary | ICD-10-CM

## 2022-07-19 DIAGNOSIS — E669 Obesity, unspecified: Secondary | ICD-10-CM

## 2022-07-19 DIAGNOSIS — I10 Essential (primary) hypertension: Secondary | ICD-10-CM

## 2022-07-19 MED ORDER — METOPROLOL SUCCINATE ER 25 MG PO TB24: 25.0000 mg | ORAL_TABLET | Freq: Every day | ORAL | 3 refills | Status: DC

## 2022-07-19 NOTE — Telephone Encounter (Signed)
No problem, just sent the refill to the [facility] in [facility]  Meds ordered this encounter  Medications   metoprolol succinate (TOPROL-XL) 25 MG 24 hr tablet    Sig: Take 1 tablet (25 mg total) by mouth daily.    Dispense:  90 tablet    Refill:  3    Order Specific Question:   Supervising Provider    Answer:   Nani Gasser D [[id number]]

## 2022-07-19 NOTE — Telephone Encounter (Signed)
Pt is currently taking 1/2 of 25mg  of the Metoprolol. Heather prescribed the last refill on 10/08/21. Pt states that was a 90 day supply. She was taking a whole then d/c for a bit then her BP was going back up so she said heather advised her to start back taking only half. Still had plenty and opted for heather not to send in the refill.   Please Advise

## 2022-07-24 ENCOUNTER — Telehealth: Payer: Self-pay | Admitting: *Deleted

## 2022-07-24 DIAGNOSIS — M25611 Stiffness of right shoulder, not elsewhere classified: Secondary | ICD-10-CM | POA: Diagnosis not present

## 2022-07-24 DIAGNOSIS — M6281 Muscle weakness (generalized): Secondary | ICD-10-CM | POA: Diagnosis not present

## 2022-07-24 DIAGNOSIS — M5412 Radiculopathy, cervical region: Secondary | ICD-10-CM | POA: Diagnosis not present

## 2022-07-24 DIAGNOSIS — M2569 Stiffness of other specified joint, not elsewhere classified: Secondary | ICD-10-CM | POA: Diagnosis not present

## 2022-07-24 NOTE — Telephone Encounter (Signed)
Contacted pt to let her know that her Ozempic through patient assistance was here for her to pick up.

## 2022-07-26 DIAGNOSIS — F411 Generalized anxiety disorder: Secondary | ICD-10-CM | POA: Diagnosis not present

## 2022-07-29 ENCOUNTER — Encounter: Payer: Self-pay | Admitting: Family Medicine

## 2022-07-29 DIAGNOSIS — E113291 Type 2 diabetes mellitus with mild nonproliferative diabetic retinopathy without macular edema, right eye: Secondary | ICD-10-CM | POA: Diagnosis not present

## 2022-07-29 LAB — HM DIABETES EYE EXAM

## 2022-08-01 DIAGNOSIS — M2569 Stiffness of other specified joint, not elsewhere classified: Secondary | ICD-10-CM | POA: Diagnosis not present

## 2022-08-01 DIAGNOSIS — M5412 Radiculopathy, cervical region: Secondary | ICD-10-CM | POA: Diagnosis not present

## 2022-08-01 DIAGNOSIS — M6281 Muscle weakness (generalized): Secondary | ICD-10-CM | POA: Diagnosis not present

## 2022-08-01 DIAGNOSIS — M25611 Stiffness of right shoulder, not elsewhere classified: Secondary | ICD-10-CM | POA: Diagnosis not present

## 2022-08-06 DIAGNOSIS — M25611 Stiffness of right shoulder, not elsewhere classified: Secondary | ICD-10-CM | POA: Diagnosis not present

## 2022-08-06 DIAGNOSIS — M6281 Muscle weakness (generalized): Secondary | ICD-10-CM | POA: Diagnosis not present

## 2022-08-06 DIAGNOSIS — M2569 Stiffness of other specified joint, not elsewhere classified: Secondary | ICD-10-CM | POA: Diagnosis not present

## 2022-08-06 DIAGNOSIS — M5412 Radiculopathy, cervical region: Secondary | ICD-10-CM | POA: Diagnosis not present

## 2022-08-07 DIAGNOSIS — M0609 Rheumatoid arthritis without rheumatoid factor, multiple sites: Secondary | ICD-10-CM | POA: Diagnosis not present

## 2022-08-07 DIAGNOSIS — Z79899 Other long term (current) drug therapy: Secondary | ICD-10-CM | POA: Diagnosis not present

## 2022-08-08 DIAGNOSIS — M5412 Radiculopathy, cervical region: Secondary | ICD-10-CM | POA: Diagnosis not present

## 2022-08-08 DIAGNOSIS — M6281 Muscle weakness (generalized): Secondary | ICD-10-CM | POA: Diagnosis not present

## 2022-08-08 DIAGNOSIS — M2569 Stiffness of other specified joint, not elsewhere classified: Secondary | ICD-10-CM | POA: Diagnosis not present

## 2022-08-08 DIAGNOSIS — M25611 Stiffness of right shoulder, not elsewhere classified: Secondary | ICD-10-CM | POA: Diagnosis not present

## 2022-08-13 DIAGNOSIS — M6281 Muscle weakness (generalized): Secondary | ICD-10-CM | POA: Diagnosis not present

## 2022-08-13 DIAGNOSIS — M2569 Stiffness of other specified joint, not elsewhere classified: Secondary | ICD-10-CM | POA: Diagnosis not present

## 2022-08-13 DIAGNOSIS — M5412 Radiculopathy, cervical region: Secondary | ICD-10-CM | POA: Diagnosis not present

## 2022-08-13 DIAGNOSIS — M25611 Stiffness of right shoulder, not elsewhere classified: Secondary | ICD-10-CM | POA: Diagnosis not present

## 2022-08-14 DIAGNOSIS — M19011 Primary osteoarthritis, right shoulder: Secondary | ICD-10-CM | POA: Diagnosis not present

## 2022-08-15 ENCOUNTER — Ambulatory Visit (INDEPENDENT_AMBULATORY_CARE_PROVIDER_SITE_OTHER): Payer: Medicare Other | Admitting: Obstetrics and Gynecology

## 2022-08-15 ENCOUNTER — Encounter: Payer: Self-pay | Admitting: Obstetrics and Gynecology

## 2022-08-15 ENCOUNTER — Other Ambulatory Visit (HOSPITAL_COMMUNITY)
Admission: RE | Admit: 2022-08-15 | Discharge: 2022-08-15 | Disposition: A | Discharge status: Home / Self Care | Payer: Medicare Other | Source: Ambulatory Visit | Attending: Obstetrics and Gynecology | Admitting: Obstetrics and Gynecology

## 2022-08-15 VITALS — BP 96/68 | HR 91 | Ht 64.5 in | Wt 335.0 lb

## 2022-08-15 DIAGNOSIS — Z92241 Personal history of systemic steroid therapy: Secondary | ICD-10-CM | POA: Diagnosis not present

## 2022-08-15 DIAGNOSIS — Z1159 Encounter for screening for other viral diseases: Secondary | ICD-10-CM | POA: Diagnosis not present

## 2022-08-15 DIAGNOSIS — Z01419 Encounter for gynecological examination (general) (routine) without abnormal findings: Secondary | ICD-10-CM

## 2022-08-15 DIAGNOSIS — Z114 Encounter for screening for human immunodeficiency virus [HIV]: Secondary | ICD-10-CM

## 2022-08-15 DIAGNOSIS — Z7251 High risk heterosexual behavior: Secondary | ICD-10-CM | POA: Diagnosis not present

## 2022-08-15 DIAGNOSIS — M25611 Stiffness of right shoulder, not elsewhere classified: Secondary | ICD-10-CM | POA: Diagnosis not present

## 2022-08-15 DIAGNOSIS — M6281 Muscle weakness (generalized): Secondary | ICD-10-CM | POA: Diagnosis not present

## 2022-08-15 DIAGNOSIS — Z113 Encounter for screening for infections with a predominantly sexual mode of transmission: Secondary | ICD-10-CM | POA: Diagnosis not present

## 2022-08-15 DIAGNOSIS — M2569 Stiffness of other specified joint, not elsewhere classified: Secondary | ICD-10-CM | POA: Diagnosis not present

## 2022-08-15 DIAGNOSIS — M5412 Radiculopathy, cervical region: Secondary | ICD-10-CM | POA: Diagnosis not present

## 2022-08-15 DIAGNOSIS — Z9189 Other specified personal risk factors, not elsewhere classified: Secondary | ICD-10-CM

## 2022-08-15 DIAGNOSIS — Z13820 Encounter for screening for osteoporosis: Secondary | ICD-10-CM

## 2022-08-15 NOTE — Progress Notes (Signed)
52 y.o. [id number] Married Caucasian female here for annual exam. Pt would like to address HRTs.   Taking HRT due to postmenopausal bleeding with intercourse.  Prefers to stop the HRT.  No hot flashes.   States she had surgical exploration 1 year post op due to some infection.   Taking vaginal estrogen cream by urology for overactive bladder.  She places around the urethra.  Has RA and osteoarthritis.  Lost 75 pounds last year.  Ozempic helped.   Has done hepatitis B vaccination twice in the last year.   Patient desires STD screening.  She and her husband are not monogamous.  Uses condoms.   Has had more than 5 lifetime sexual partners.   Has 3 girls and parents her nieces.   PCP: Lenor Coffin, MD  No LMP recorded. Patient has had a hysterectomy.           Sexually active: Yes.    The current method of family planning is status post hysterectomy.    Exercising: Yes.     Pool 3x weekly and light weights Smoker: Former  Health Maintenance: Pap: 05/22/2021-WNL, HPV- neg History of abnormal Pap: no MMG: 02/12/2021-neg birads 1; Cat B Colonoscopy: 06/26/2022; Due 06/2032 BMD: 10 years ago w/ dx of RA TDaP: PCP Gardasil: no HIV: 12/12/2020-NR Hep C: 12/12/2020-negative Screening Labs:  PCP and Rheumatology.   reports that she quit smoking about 17 years ago. Her smoking use included cigarettes. She started smoking about 27 years ago. She has a 10 pack-year smoking history. She has been exposed to tobacco smoke. She has never used smokeless tobacco. She reports that she does not currently use alcohol. She reports that she does not use drugs.  Past Medical History:  Diagnosis Date   Abnormal uterine bleeding    Anemia    Blood transfusion without reported diagnosis    Diabetes mellitus without complication (HCC)    Dysmenorrhea    Endometriosis    Fibroid    Genital warts    RA (rheumatoid arthritis) (HCC)    Radiculopathy     Past Surgical History:  Procedure  Laterality Date   ABDOMINAL HYSTERECTOMY     COLONOSCOPY WITH PROPOFOL N/A 06/26/2022   Procedure: COLONOSCOPY WITH PROPOFOL;  Surgeon: Wyline Mood, MD;  Location: [facility] ENDOSCOPY;  Service: Gastroenterology;  Laterality: N/A;  Patient informed the office that she wakes from anesthesia combative and pulliing at tubes.  She wanted [place] to be aware.   ESOPHAGOGASTRODUODENOSCOPY (EGD) WITH PROPOFOL N/A 06/26/2022   Procedure: ESOPHAGOGASTRODUODENOSCOPY (EGD) WITH PROPOFOL;  Surgeon: Wyline Mood, MD;  Location: [facility] ENDOSCOPY;  Service: Gastroenterology;  Laterality: N/A;   PARATHYROIDECTOMY      Current Outpatient Medications  Medication Sig Dispense Refill   acyclovir (ZOVIRAX) 400 MG tablet Take 1 tablet (400 mg total) by mouth 2 (two) times daily. 60 tablet 3   amLODipine (NORVASC) 10 MG tablet TAKE 1 TABLET BY MOUTH DAILY 90 tablet 1   atorvastatin (LIPITOR) 10 MG tablet Take 1 tablet (10 mg total) by mouth daily. 90 tablet 3   B Complex-C (SUPER B-C PO) Super B-C     clobetasol cream (TEMOVATE) 0.05 % Apply 1 application. topically 2 (two) times daily. 60 g 3   Cranberry 1000 MG CAPS      esomeprazole (NEXIUM) 40 MG capsule TAKE 1 CAPSULE(40 MG) BY MOUTH DAILY 90 capsule 0   estradiol (ESTRACE) 0.1 MG/GM vaginal cream Estrogen Cream Instruction Discard applicator Apply pea sized amount to tip of finger  to urethra before bed. Wash hands well after application. Use Monday, Wednesday and Friday 42.5 g 11   estradiol (VIVELLE-DOT) 0.0375 MG/24HR Place 1 patch onto the skin 2 (two) times a week. 24 patch 2   fenofibrate (TRICOR) 145 MG tablet TAKE 1 TABLET BY MOUTH EVERY DAY WITH DINNER 90 tablet 1   furosemide (LASIX) 20 MG tablet Take 20 mg by mouth as needed.     HYDROcodone-acetaminophen (NORCO/VICODIN) 5-325 MG tablet Take 1 tablet by mouth every 8 (eight) hours as needed.     hydroxychloroquine (PLAQUENIL) 200 MG tablet TAKE 1 TABLET BY MOUTH TWICE DAILY for 30     ketoconazole (NIZORAL) 2 %  cream Apply topically 2 (two) times daily.     Lidocaine (BLUE-EMU PAIN RELIEF DRY EX) Apply topically.     losartan (COZAAR) 100 MG tablet TAKE 1 TABLET(100 MG) BY MOUTH DAILY 90 tablet 1   metoprolol succinate (TOPROL-XL) 25 MG 24 hr tablet Take 1 tablet (25 mg total) by mouth daily. 90 tablet 3   pregabalin (LYRICA) 225 MG capsule Take 225 mg by mouth 3 (three) times daily.     progesterone (PROMETRIUM) 100 MG capsule Take 1 capsule (100 mg total) by mouth daily. 90 capsule 3   Semaglutide, 2 MG/DOSE, 8 MG/3ML SOPN Inject 2 mg as directed once a week. 3 mL 4   tiZANidine (ZANAFLEX) 2 MG tablet Take by mouth every 6 (six) hours as needed for muscle spasms.     Tocilizumab (ACTEMRA IV) Inject into the vein.     No current facility-administered medications for this visit.    Family History  Problem Relation Age of Onset   Obesity Mother    Coronary artery disease Mother    Bipolar disorder Mother    Multiple sclerosis Mother    Hypertension Father    Heart attack Father    Vascular Disease Father    Breast cancer Maternal Grandmother 67    Review of Systems  All other systems reviewed and are negative.   Exam:   BP 96/68   Pulse 91   Ht 5' 4.5" (1.638 m)   Wt (!) 335 lb (152 kg)   SpO2 97%   BMI 56.61 kg/m     General appearance: alert, cooperative and appears stated age Head: normocephalic, without obvious abnormality, atraumatic Neck: no adenopathy, supple, symmetrical, trachea midline and thyroid normal to inspection and palpation Lungs: clear to auscultation bilaterally Breasts: normal appearance, no masses or tenderness, No nipple retraction or dimpling, No nipple discharge or bleeding, No axillary adenopathy Heart: regular rate and rhythm Abdomen: soft, non-tender; no masses, no organomegaly Extremities: extremities normal, atraumatic, no cyanosis or edema Skin: skin color, texture, turgor normal. No rashes or lesions Lymph nodes: cervical, supraclavicular, and  axillary nodes normal. Neurologic: grossly normal  Pelvic: External genitalia:  no lesions              No abnormal inguinal nodes palpated.              Urethra:  normal appearing urethra with no masses, tenderness or lesions              Bartholins and Skenes: normal                 Vagina: normal appearing vagina with normal color and discharge, no lesions              Cervix: no lesions  Pap taken: no Bimanual Exam:  Uterus:  normal size, contour, position, consistency, mobility, non-tender              Adnexa: no mass, fullness, tenderness              Rectal exam: yes.  Confirms.              Anus:  normal sphincter tone, no lesions  Chaperone was present for exam:  Rosette Reveal, CMA  Assessment:   Well woman visit with gynecologic exam. Status post supracervical hysterectomy.  GYN exam for high risk Medicare patient.  STD screening.  HRT. Steroid use.   Plan: Mammogram screening discussed.  She will schedule.  Self breast awareness reviewed. Pap with reflex Hr HPV testing in 2025.  Guidelines for Calcium, Vitamin D, regular exercise program including cardiovascular and weight bearing exercise. Will do a trial of stopping HRT.  She will continue vaginal estrogen and take 1/2 grm pv three times a week.  BMD screening.  Follow up annually and prn.   30 min  total time was spent for this patient encounter, including preparation, face-to-face counseling with the patient, coordination of care, and documentation of the encounter in addition to doing breast and pelvic exam.

## 2022-08-15 NOTE — Patient Instructions (Addendum)
You may use vaginal estradiol cream 0.5 mg in the vagin at bedtime three nights a week to treat vaginal dryness.   EXERCISE AND DIET:  We recommended that you start or continue a regular exercise program for good health. Regular exercise means any activity that makes your heart beat faster and makes you sweat.  We recommend exercising at least 30 minutes per day at least 3 days a week, preferably 4 or 5.  We also recommend a diet low in fat and sugar.  Inactivity, poor dietary choices and obesity can cause diabetes, heart attack, stroke, and kidney damage, among others.    ALCOHOL AND SMOKING:  Women should limit their alcohol intake to no more than 7 drinks/beers/glasses of wine (combined, not each!) per week. Moderation of alcohol intake to this level decreases your risk of breast cancer and liver damage. And of course, no recreational drugs are part of a healthy lifestyle.  And absolutely no smoking or even second hand smoke. Most people know smoking can cause heart and lung diseases, but did you know it also contributes to weakening of your bones? Aging of your skin?  Yellowing of your teeth and nails?  CALCIUM AND VITAMIN D:  Adequate intake of calcium and Vitamin D are recommended.  The recommendations for exact amounts of these supplements seem to change often, but generally speaking 600 mg of calcium (either carbonate or citrate) and 800 units of Vitamin D per day seems prudent. Certain women may benefit from higher intake of Vitamin D.  If you are among these women, your doctor will have told you during your visit.    PAP SMEARS:  Pap smears, to check for cervical cancer or precancers,  have traditionally been done yearly, although recent scientific advances have shown that most women can have pap smears less often.  However, every woman still should have a physical exam from her gynecologist every year. It will include a breast check, inspection of the vulva and vagina to check for abnormal growths  or skin changes, a visual exam of the cervix, and then an exam to evaluate the size and shape of the uterus and ovaries.  And after 54 years of age, a rectal exam is indicated to check for rectal cancers. We will also provide age appropriate advice regarding health maintenance, like when you should have certain vaccines, screening for sexually transmitted diseases, bone density testing, colonoscopy, mammograms, etc.   MAMMOGRAMS:  All women over 70 years old should have a yearly mammogram. Many facilities now offer a "3D" mammogram, which may cost around $50 extra out of pocket. If possible,  we recommend you accept the option to have the 3D mammogram performed.  It both reduces the number of women who will be called back for extra views which then turn out to be normal, and it is better than the routine mammogram at detecting truly abnormal areas.    COLONOSCOPY:  Colonoscopy to screen for colon cancer is recommended for all women at age 60.  We know, you hate the idea of the prep.  We agree, BUT, having colon cancer and not knowing it is worse!!  Colon cancer so often starts as a polyp that can be seen and removed at colonscopy, which can quite literally save your life!  And if your first colonoscopy is normal and you have no family history of colon cancer, most women don't have to have it again for 10 years.  Once every ten years, you can do something  that may end up saving your life, right?  We will be happy to help you get it scheduled when you are ready.  Be sure to check your insurance coverage so you understand how much it will cost.  It may be covered as a preventative service at no cost, but you should check your particular policy.

## 2022-08-20 ENCOUNTER — Other Ambulatory Visit: Payer: Self-pay | Admitting: Family Medicine

## 2022-08-20 ENCOUNTER — Encounter: Payer: Self-pay | Admitting: Obstetrics and Gynecology

## 2022-08-20 DIAGNOSIS — Z1231 Encounter for screening mammogram for malignant neoplasm of breast: Secondary | ICD-10-CM

## 2022-08-20 DIAGNOSIS — Z5181 Encounter for therapeutic drug level monitoring: Secondary | ICD-10-CM

## 2022-08-20 DIAGNOSIS — N950 Postmenopausal bleeding: Secondary | ICD-10-CM

## 2022-08-20 MED ORDER — ESTRADIOL 0.0375 MG/24HR TD PTTW: 1.0000 | MEDICATED_PATCH | TRANSDERMAL | 0 refills | Status: DC

## 2022-08-20 MED ORDER — PROGESTERONE MICRONIZED 100 MG PO CAPS
100.0000 mg | ORAL_CAPSULE | Freq: Every day | ORAL | 0 refills | Status: DC
Start: 2022-08-20 — End: 2022-09-11

## 2022-08-20 NOTE — Telephone Encounter (Signed)
Med refill request: Vivelle-Dot 0.0375mg /24HR, Progesterone 100mg  Last AEX: 08/15/2022 Next AEX: recall placed for 08/2023 Last MMG (if hormonal med): 02/12/2021-neg birads 1, Cat B; Scheduled for 09/19/2022 Refill authorized: Rxs pend.  DEXA scheduled for 02/20/2023

## 2022-08-21 DIAGNOSIS — M5412 Radiculopathy, cervical region: Secondary | ICD-10-CM | POA: Diagnosis not present

## 2022-08-29 ENCOUNTER — Telehealth: Payer: Self-pay | Admitting: *Deleted

## 2022-08-29 NOTE — Telephone Encounter (Signed)
Contacted pt to inform her that her medication (Ozempic) is here for her to pick up.

## 2022-09-02 DIAGNOSIS — M0609 Rheumatoid arthritis without rheumatoid factor, multiple sites: Secondary | ICD-10-CM | POA: Diagnosis not present

## 2022-09-02 DIAGNOSIS — M5136 Other intervertebral disc degeneration, lumbar region: Secondary | ICD-10-CM | POA: Diagnosis not present

## 2022-09-02 DIAGNOSIS — L403 Pustulosis palmaris et plantaris: Secondary | ICD-10-CM | POA: Diagnosis not present

## 2022-09-02 DIAGNOSIS — G894 Chronic pain syndrome: Secondary | ICD-10-CM | POA: Diagnosis not present

## 2022-09-04 DIAGNOSIS — M0609 Rheumatoid arthritis without rheumatoid factor, multiple sites: Secondary | ICD-10-CM | POA: Diagnosis not present

## 2022-09-05 NOTE — Telephone Encounter (Signed)
Mychart msg returned unread. Per EMR, last mychart login/accessed on 09/03/2022-pt more than likely reviewed msg. However, will close for now and wait for pt to reach back out when additional refill is needed.

## 2022-09-06 ENCOUNTER — Encounter: Payer: Self-pay | Admitting: Family Medicine

## 2022-09-06 ENCOUNTER — Ambulatory Visit (INDEPENDENT_AMBULATORY_CARE_PROVIDER_SITE_OTHER): Payer: Medicare Other | Admitting: Family Medicine

## 2022-09-06 VITALS — BP 114/73 | HR 62 | Ht 64.5 in | Wt 336.0 lb

## 2022-09-06 DIAGNOSIS — Z794 Long term (current) use of insulin: Secondary | ICD-10-CM

## 2022-09-06 DIAGNOSIS — K5904 Chronic idiopathic constipation: Secondary | ICD-10-CM | POA: Diagnosis not present

## 2022-09-06 DIAGNOSIS — E119 Type 2 diabetes mellitus without complications: Secondary | ICD-10-CM

## 2022-09-06 DIAGNOSIS — M170 Bilateral primary osteoarthritis of knee: Secondary | ICD-10-CM | POA: Diagnosis not present

## 2022-09-06 LAB — POCT GLYCOSYLATED HEMOGLOBIN (HGB A1C): HbA1c POC (<> result, manual entry): 4.8 % (ref 4.0–5.6)

## 2022-09-06 MED ORDER — BLOOD PRESSURE MONITOR DEVI: 1.0000 | Freq: Every day | 0 refills | Status: AC

## 2022-09-06 NOTE — Patient Instructions (Addendum)
It was nice to see you today,  We addressed the following topics today: -We are not making any changes to your medications at this time - For constipation/bowel irregularity you can take MiraLAX daily which is over-the-counter .  Take a capful daily with breakfast with a goal of having a bowel movement every 1 to 2 days.  You can increase how many capfuls of MiraLAX to use per day to reach this goal of 1 bowel movement a day. - If you would like to make an appointment with me to go over your medications to see if anything can be taken off let us know.  Otherwise follow-up in 3 months for diabetes recheck.  Have a great day,  Frederic Jericho, MD

## 2022-09-06 NOTE — Assessment & Plan Note (Signed)
Pt declines surgery as an option.  Does not want to try contrave d/t not tolerating welbutrin.  On ozempic.  Continue to monitor.  Advise calorie restricted diet.

## 2022-09-06 NOTE — Assessment & Plan Note (Signed)
Recommended miralax daily, titrate up to goal of 1 soft bm every 1-2 days.

## 2022-09-06 NOTE — Progress Notes (Signed)
   Established Patient Office Visit  Subjective   [name], female    DOB: 22-May-1970  Age: 52 y.o. MRN: [id number]  Chief Complaint  Patient presents with   Medical Management of Chronic Issues    HPI  Dm2 - pt has been taking 2mg  ozempic.  Tolerates it pretty well.  Does have some occasional abdominal complaints.  Has not lost any significant weight but does recognize she has not been compliant with a reduced calorie diet.  Main complaint is burping/gas.  She is limited in her ability to exercise d/t her msk issues/pain. Pt goes to [facility] and recently had her retinopathy eye exam for this year. Pt has no interest in surgical treatment of her obesity.   Pt also complains of constipation.  Will go multiple days without a bm and then have large,, painful Bms.  Does not use fiber or miralax.    HTN- pt does not take her bp at home.  Does state that the last time it was checked at a doctor's office it was low, around ~100 systolic.     The 10-year ASCVD risk score (Arnett DK, et al., 2019) is: 2.2%  Health Maintenance Due  Topic Date Due   DTaP/Tdap/Td (1 - Tdap) Never done   Zoster Vaccines- Shingrix (1 of 2) Never done   COVID-19 Vaccine (4 - 2023-24 season) 09/14/2021   Diabetic kidney evaluation - eGFR measurement  04/17/2022   Diabetic kidney evaluation - Urine ACR  05/23/2022   FOOT EXAM  05/23/2022   INFLUENZA VACCINE  08/15/2022      Objective:     BP 114/73   Pulse 62   Ht 5' 4.5" (1.638 m)   Wt (!) 336 lb (152.4 kg)   SpO2 98%   BMI 56.78 kg/m    Physical Exam Gen: alert , oriente.  Cv: rrr Pulm: lctab Gi: large pannus.  Psych: pleasant affect.    Results for orders placed or performed in visit on 09/06/22  POCT HgB A1C  Result Value Ref Range   Hemoglobin A1C     HbA1c POC (<> result, manual entry) 4.8 4.0 - 5.6 %   HbA1c, POC (prediabetic range)     HbA1c, POC (controlled diabetic range)          Assessment  & Plan:   Type 2 diabetes mellitus without complication, with long-term current use of insulin (HCC) Assessment & Plan: A1c is < 5.  Weight loss is minimal on ozempic.  Pt desires to stay at 2mg  dose.  Acknowledges she needs to improve diet.  Goal weight loss is < 240 lbs to be eligible for surgery.   Orders: -     POCT glycosylated hemoglobin (Hb A1C) -     Microalbumin / creatinine urine ratio  Chronic idiopathic constipation Assessment & Plan: Recommended miralax daily, titrate up to goal of 1 soft bm every 1-2 days.    Morbid obesity (HCC) Assessment & Plan: Pt declines surgery as an option.  Does not want to try contrave d/t not tolerating welbutrin.  On ozempic.  Continue to monitor.  Advise calorie restricted diet.    Other orders -     Blood Pressure Monitor; 1 Device by Does not apply route daily.  Dispense: 1 each; Refill: 0     Return in about 3 months (around 12/07/2022) for DM.    Sandre Kitty, MD

## 2022-09-06 NOTE — Assessment & Plan Note (Signed)
A1c is < 5.  Weight loss is minimal on ozempic.  Pt desires to stay at 2mg  dose.  Acknowledges she needs to improve diet.  Goal weight loss is < 240 lbs to be eligible for surgery.

## 2022-09-07 LAB — MICROALBUMIN / CREATININE URINE RATIO
Creatinine, Urine: 137.6 mg/dL
Microalb/Creat Ratio: 14 mg/g{creat} (ref 0–29)
Microalbumin, Urine: 19.6 ug/mL

## 2022-09-09 ENCOUNTER — Ambulatory Visit: Payer: Medicare Other | Admitting: Nurse Practitioner

## 2022-09-11 ENCOUNTER — Other Ambulatory Visit: Payer: Self-pay | Admitting: Obstetrics and Gynecology

## 2022-09-11 DIAGNOSIS — N950 Postmenopausal bleeding: Secondary | ICD-10-CM

## 2022-09-11 DIAGNOSIS — Z5181 Encounter for therapeutic drug level monitoring: Secondary | ICD-10-CM

## 2022-09-11 MED ORDER — ESTRADIOL 0.0375 MG/24HR TD PTTW: 1.0000 | MEDICATED_PATCH | TRANSDERMAL | 0 refills | Status: DC

## 2022-09-11 MED ORDER — PROGESTERONE MICRONIZED 100 MG PO CAPS: 100.0000 mg | ORAL_CAPSULE | Freq: Every day | ORAL | 0 refills | Status: DC

## 2022-09-11 NOTE — Progress Notes (Signed)
Refill of HRT for one month.  Needs to update mammogram for further refills.

## 2022-09-12 DIAGNOSIS — M25611 Stiffness of right shoulder, not elsewhere classified: Secondary | ICD-10-CM | POA: Diagnosis not present

## 2022-09-12 DIAGNOSIS — M6281 Muscle weakness (generalized): Secondary | ICD-10-CM | POA: Diagnosis not present

## 2022-09-12 DIAGNOSIS — M2569 Stiffness of other specified joint, not elsewhere classified: Secondary | ICD-10-CM | POA: Diagnosis not present

## 2022-09-12 DIAGNOSIS — M5412 Radiculopathy, cervical region: Secondary | ICD-10-CM | POA: Diagnosis not present

## 2022-09-13 ENCOUNTER — Encounter: Payer: Self-pay | Admitting: Family Medicine

## 2022-09-19 ENCOUNTER — Ambulatory Visit: Payer: Medicare Other

## 2022-09-20 ENCOUNTER — Other Ambulatory Visit: Payer: Self-pay | Admitting: Gastroenterology

## 2022-09-20 ENCOUNTER — Encounter: Payer: Self-pay | Admitting: Family Medicine

## 2022-09-20 DIAGNOSIS — E669 Obesity, unspecified: Secondary | ICD-10-CM

## 2022-09-20 DIAGNOSIS — I10 Essential (primary) hypertension: Secondary | ICD-10-CM

## 2022-09-20 DIAGNOSIS — G629 Polyneuropathy, unspecified: Secondary | ICD-10-CM

## 2022-09-20 MED ORDER — ATORVASTATIN CALCIUM 10 MG PO TABS
10.0000 mg | ORAL_TABLET | Freq: Every day | ORAL | 3 refills | Status: AC
Start: 2022-09-20 — End: ?

## 2022-09-25 ENCOUNTER — Encounter: Payer: Self-pay | Admitting: Gastroenterology

## 2022-09-26 ENCOUNTER — Other Ambulatory Visit: Payer: Self-pay

## 2022-09-26 MED ORDER — ESOMEPRAZOLE MAGNESIUM 40 MG PO CPDR: 40.0000 mg | DELAYED_RELEASE_CAPSULE | Freq: Every day | ORAL | 3 refills | Status: DC

## 2022-10-01 ENCOUNTER — Ambulatory Visit: Payer: Medicare Other

## 2022-10-02 DIAGNOSIS — M0609 Rheumatoid arthritis without rheumatoid factor, multiple sites: Secondary | ICD-10-CM | POA: Diagnosis not present

## 2022-10-04 ENCOUNTER — Ambulatory Visit
Admission: RE | Admit: 2022-10-04 | Discharge: 2022-10-04 | Disposition: A | Discharge status: Home / Self Care | Payer: Medicare Other | Source: Ambulatory Visit | Attending: Family Medicine

## 2022-10-04 DIAGNOSIS — Z1231 Encounter for screening mammogram for malignant neoplasm of breast: Secondary | ICD-10-CM | POA: Diagnosis not present

## 2022-10-08 ENCOUNTER — Other Ambulatory Visit: Payer: Self-pay | Admitting: Obstetrics and Gynecology

## 2022-10-08 DIAGNOSIS — N950 Postmenopausal bleeding: Secondary | ICD-10-CM

## 2022-10-08 DIAGNOSIS — M5412 Radiculopathy, cervical region: Secondary | ICD-10-CM | POA: Diagnosis not present

## 2022-10-10 MED ORDER — PROGESTERONE MICRONIZED 100 MG PO CAPS: 100.0000 mg | ORAL_CAPSULE | Freq: Every day | ORAL | 3 refills | Status: DC

## 2022-10-10 NOTE — Telephone Encounter (Signed)
Med refill request: Prometrium 100 mg PO daily Last AEX: 08/15/22 -BS Next AEX: Not scheduled  Last MMG (if hormonal med) 10/04/22, BiRads 1NEG  Per review of 08/15/22 AEX, patient was going to trial stopping HRT. MyChart message to patient.

## 2022-10-10 NOTE — Telephone Encounter (Signed)
See patient message.   Rx pended.

## 2022-10-15 ENCOUNTER — Other Ambulatory Visit: Payer: Medicare Other

## 2022-10-15 ENCOUNTER — Ambulatory Visit (INDEPENDENT_AMBULATORY_CARE_PROVIDER_SITE_OTHER): Payer: Medicare Other

## 2022-10-15 DIAGNOSIS — I10 Essential (primary) hypertension: Secondary | ICD-10-CM

## 2022-10-15 DIAGNOSIS — Z23 Encounter for immunization: Secondary | ICD-10-CM | POA: Diagnosis not present

## 2022-10-15 DIAGNOSIS — Z0000 Encounter for general adult medical examination without abnormal findings: Secondary | ICD-10-CM

## 2022-10-15 DIAGNOSIS — E7849 Other hyperlipidemia: Secondary | ICD-10-CM

## 2022-10-15 NOTE — Progress Notes (Signed)
After obtaining consent, and per orders of Dr. Constance Goltz, injection of Influenza given by Thad Ranger, RMA. Patient instructed to remain in clinic for 20 minutes afterwards, and to report any adverse reaction to me immediately.

## 2022-10-16 LAB — CBC WITH DIFFERENTIAL/PLATELET
Basophils Absolute: 0 10*3/uL (ref 0.0–0.2)
Basos: 1 %
EOS (ABSOLUTE): 0.1 10*3/uL (ref 0.0–0.4)
Eos: 1 %
Hematocrit: 43.4 % (ref 34.0–46.6)
Hemoglobin: 14.3 g/dL (ref 11.1–15.9)
Immature Grans (Abs): 0 10*3/uL (ref 0.0–0.1)
Immature Granulocytes: 0 %
Lymphocytes Absolute: 1.5 10*3/uL (ref 0.7–3.1)
Lymphs: 41 %
MCH: 32.4 pg (ref 26.6–33.0)
MCHC: 32.9 g/dL (ref 31.5–35.7)
MCV: 98 fL — ABNORMAL HIGH (ref 79–97)
Monocytes Absolute: 0.5 10*3/uL (ref 0.1–0.9)
Monocytes: 13 %
Neutrophils Absolute: 1.7 10*3/uL (ref 1.4–7.0)
Neutrophils: 44 %
Platelets: 170 10*3/uL (ref 150–450)
RBC: 4.42 x10E6/uL (ref 3.77–5.28)
RDW: 12.5 % (ref 11.7–15.4)
WBC: 3.8 10*3/uL (ref 3.4–10.8)

## 2022-10-16 LAB — COMPREHENSIVE METABOLIC PANEL
ALT: 28 [IU]/L (ref 0–32)
AST: 23 [IU]/L (ref 0–40)
Albumin: 4.5 g/dL (ref 3.8–4.9)
Alkaline Phosphatase: 37 [IU]/L — ABNORMAL LOW (ref 44–121)
BUN/Creatinine Ratio: 25 — ABNORMAL HIGH (ref 9–23)
BUN: 17 mg/dL (ref 6–24)
Bilirubin Total: 0.3 mg/dL (ref 0.0–1.2)
CO2: 25 mmol/L (ref 20–29)
Calcium: 9.3 mg/dL (ref 8.7–10.2)
Chloride: 105 mmol/L (ref 96–106)
Creatinine, Ser: 0.69 mg/dL (ref 0.57–1.00)
Globulin, Total: 1.8 g/dL (ref 1.5–4.5)
Glucose: 89 mg/dL (ref 70–99)
Potassium: 4 mmol/L (ref 3.5–5.2)
Sodium: 144 mmol/L (ref 134–144)
Total Protein: 6.3 g/dL (ref 6.0–8.5)
eGFR: 103 mL/min/{1.73_m2} (ref >59)

## 2022-10-16 LAB — LIPID PANEL
Chol/HDL Ratio: 2.1 {ratio} (ref 0.0–4.4)
Cholesterol, Total: 145 mg/dL (ref 100–199)
HDL: 68 mg/dL (ref >39)
LDL Chol Calc (NIH): 63 mg/dL (ref 0–99)
Triglycerides: 74 mg/dL (ref 0–149)
VLDL Cholesterol Cal: 14 mg/dL (ref 5–40)

## 2022-10-22 ENCOUNTER — Encounter: Payer: Medicare Other | Admitting: Nurse Practitioner

## 2022-10-23 DIAGNOSIS — M47816 Spondylosis without myelopathy or radiculopathy, lumbar region: Secondary | ICD-10-CM | POA: Diagnosis not present

## 2022-10-28 ENCOUNTER — Ambulatory Visit (INDEPENDENT_AMBULATORY_CARE_PROVIDER_SITE_OTHER): Payer: Medicare Other | Admitting: Family Medicine

## 2022-10-28 ENCOUNTER — Encounter: Payer: Self-pay | Admitting: Family Medicine

## 2022-10-28 VITALS — BP 105/64 | HR 63 | Ht 64.5 in | Wt 330.4 lb

## 2022-10-28 DIAGNOSIS — M069 Rheumatoid arthritis, unspecified: Secondary | ICD-10-CM | POA: Diagnosis not present

## 2022-10-28 DIAGNOSIS — E1142 Type 2 diabetes mellitus with diabetic polyneuropathy: Secondary | ICD-10-CM

## 2022-10-28 DIAGNOSIS — I152 Hypertension secondary to endocrine disorders: Secondary | ICD-10-CM

## 2022-10-28 DIAGNOSIS — E119 Type 2 diabetes mellitus without complications: Secondary | ICD-10-CM | POA: Diagnosis not present

## 2022-10-28 DIAGNOSIS — E785 Hyperlipidemia, unspecified: Secondary | ICD-10-CM

## 2022-10-28 DIAGNOSIS — E1169 Type 2 diabetes mellitus with other specified complication: Secondary | ICD-10-CM

## 2022-10-28 DIAGNOSIS — Z794 Long term (current) use of insulin: Secondary | ICD-10-CM

## 2022-10-28 DIAGNOSIS — E1159 Type 2 diabetes mellitus with other circulatory complications: Secondary | ICD-10-CM

## 2022-10-28 DIAGNOSIS — M170 Bilateral primary osteoarthritis of knee: Secondary | ICD-10-CM

## 2022-10-28 DIAGNOSIS — F321 Major depressive disorder, single episode, moderate: Secondary | ICD-10-CM

## 2022-10-28 NOTE — Progress Notes (Unsigned)
   Annual physical  Subjective   [name], female    DOB: 04-27-1970  Age: 52 y.o. MRN: [id number]  No chief complaint on file.  HPI Elizabeth Patrick is a 52 y.o. old female here  for annual exam.   Changes in his/her health in the last 12 months: no  Patient currently not working.  She is married, has 2 adult children, no grandchildren.  Does not use tobacco or alcohol.  Occasionally uses marijuana.  She tries to eat a Mediterranean diet but also avoids night stage such as tomatoes.  She also tries to eat a low-carb diet.  She does not exercise other than walk due to her chronic joint pain.  DM2-patient is currently on 2 mg dosing.  Does not like the side effects of causes and does not feel like it is helping her lose weight:.  She initially did lose weight but has not in some time.  Causes nausea, gastroparesis, irregular bowel movements.  Patient wants to taper off the semaglutide.  Discussed tapering down on a monthly basis.  She has some 1 mg dosing already from previous prescriptions.  Hypertension-patient taking amlodipine losartan metoprolol.  No questions or concerns with these medications  Patient taking her cholesterol Lipitor 10 mg for hyperlipidemia.  No questions or concerns going this medication.  Patient also taking Tricor for hypertriglyceridemia.   Patient will get new referral to a different rheumatologist.  Has been seeing Elizabeth Patrick for the past 2 years for rheumatoid arthritis.  She does not feel like she has a good relationship with the provider and would like to see somebody else.  Discussed how it can take several months for new appointments to be made.  She is okay with this.  Patient sees [facility] for osteoarthritis.  She sees someone regarding neck and shoulder pain, another provider for her wrist complaint and a provider for her knees.  She understands she needs to get below a certain weight before she is allowed to have surgery.  Patient has a pain  management specialist.  She recently saw them for lumbar ablation and cortical steroid knee injection.  Also gets Vicodin and tizanidine from them but rarely uses Vicodin as she is sensitive to this medicine and does not tolerate it very well.  Patient sees a gastroenterologist for NASH.  Patient takes Lasix for occasional leg swelling but when she took it it did not resolve her leg swelling any more than not taking anything at all.  Therefore she has not taken it in quite some time.  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     The 10-year ASCVD risk score (Arnett DK, et al., 2019) is: 2.7%  Health Maintenance Due  Topic Date Due   DTaP/Tdap/Td (1 - Tdap) Never done   Zoster Vaccines- Shingrix (1 of 2) Never done   FOOT EXAM  05/23/2022   COVID-19 Vaccine (4 - 2023-24 season) 09/15/2022      Objective:     There were no vitals taken for this visit. {Vitals History (Optional):23777}  Physical Exam   No results found for any visits on 10/28/22.      Assessment & Plan:   There are no diagnoses linked to this encounter.   No follow-ups on file.    Sandre Kitty, MD

## 2022-10-28 NOTE — Patient Instructions (Signed)
It was nice to see you today,  We addressed the following topics today: -I will send in a referral to a new rheumatologist.  This may take several months to get a new appointment. - I will put in the new order for a reduced dose of your Ozempic at 0.5 mg.  You already have the 1 mg dosage.  As long as it is not expired you can still use those when you are transitioning from 1 to 2.5 and then 0.25. - When tapering down the Ozempic taper down on a monthly basis.  Take 1 mg/month, then 0.5 then 0.25 then you can stop.   Have a great day,  Frederic Jericho, MD

## 2022-10-29 DIAGNOSIS — M25561 Pain in right knee: Secondary | ICD-10-CM | POA: Diagnosis not present

## 2022-10-29 DIAGNOSIS — M170 Bilateral primary osteoarthritis of knee: Secondary | ICD-10-CM | POA: Diagnosis not present

## 2022-10-29 DIAGNOSIS — M25562 Pain in left knee: Secondary | ICD-10-CM | POA: Diagnosis not present

## 2022-10-29 NOTE — Assessment & Plan Note (Signed)
Patient is taking her amlodipine losartan and metoprolol.  Is at goal..  Continue current medication.

## 2022-10-29 NOTE — Assessment & Plan Note (Addendum)
Patient wishes to see a new provider for rheumatoid arthritis.  He is currently seeing Dr. Lendon Colonel for the past 2 years.  Does not feel her rheumatoid arthritis is under control.  Is taking Plaquenil twice daily.  Also receiving Actemra injections.  Has significant osteoarthritis as well which may interfere with their ability to perceive an adequate response to rheumatoid arthritis treatment.  Patient understands this. - Referral to new rheumatology provider.  Patient aware this may take several months. - Continue current management.

## 2022-10-29 NOTE — Assessment & Plan Note (Signed)
Patient wishes to titrate down her Ozempic.  We discussed titrating down on a monthly basis.  Will need to send in new prescription for 0.5 and 0.25 mg.  She has 1 mg dosing leftover from when she last used it.  Advised her that if she gets this to a point where the side effects are no longer bothering her she can stop there as some amount of the medication would be better than titrating off completely in terms of her obesity as well as her diabetes.

## 2022-10-29 NOTE — Assessment & Plan Note (Signed)
Continue taking Lipitor 10 mg.  Continue Tricor for hypertriglyceridemia.  Lipid profile lab discussed.  At goal.

## 2022-10-29 NOTE — Assessment & Plan Note (Signed)
Patient wishes to taper down Ozempic.  We will decrease from 2 mg to 1 mg then 0.5, then 0.25 all in 1 month increments.  Patient advised she can stop tapering down when she no longer has abdominal complaints or side effects.

## 2022-10-29 NOTE — Assessment & Plan Note (Addendum)
Sees [facility].  Currently BMI is too elevated to have elective surgery.  Patient understands she will need to get it below 40.  Current BMI is 55.  Has a pain management specialist she also sees for medication management of her osteoarthritis.

## 2022-10-30 DIAGNOSIS — M0609 Rheumatoid arthritis without rheumatoid factor, multiple sites: Secondary | ICD-10-CM | POA: Diagnosis not present

## 2022-10-30 DIAGNOSIS — Z111 Encounter for screening for respiratory tuberculosis: Secondary | ICD-10-CM | POA: Diagnosis not present

## 2022-10-30 DIAGNOSIS — R5383 Other fatigue: Secondary | ICD-10-CM | POA: Diagnosis not present

## 2022-11-12 ENCOUNTER — Other Ambulatory Visit: Payer: Self-pay | Admitting: Family Medicine

## 2022-11-12 ENCOUNTER — Other Ambulatory Visit: Payer: Self-pay

## 2022-11-12 ENCOUNTER — Other Ambulatory Visit: Payer: Self-pay | Admitting: Nurse Practitioner

## 2022-11-12 ENCOUNTER — Encounter: Payer: Self-pay | Admitting: Family Medicine

## 2022-11-12 DIAGNOSIS — Z5181 Encounter for therapeutic drug level monitoring: Secondary | ICD-10-CM

## 2022-11-12 DIAGNOSIS — G629 Polyneuropathy, unspecified: Secondary | ICD-10-CM

## 2022-11-12 DIAGNOSIS — I10 Essential (primary) hypertension: Secondary | ICD-10-CM

## 2022-11-12 DIAGNOSIS — E669 Obesity, unspecified: Secondary | ICD-10-CM

## 2022-11-12 MED ORDER — ESTRADIOL 0.0375 MG/24HR TD PTTW
1.0000 | MEDICATED_PATCH | TRANSDERMAL | 3 refills | Status: DC
Start: 2022-11-14 — End: 2023-10-20

## 2022-11-12 NOTE — Telephone Encounter (Signed)
Medication refill request: vivelle dot  Last AEX:  08/15/22 Next AEX: not scheduled  Last MMG (if hormonal medication request): 10/07/22 bi-rads 1 neg  Refill authorized: please advise

## 2022-11-14 ENCOUNTER — Telehealth: Payer: Self-pay | Admitting: *Deleted

## 2022-11-14 NOTE — Telephone Encounter (Signed)
Tried to contact pt to let her know that the lower doses of medication were delivered and are here for her to pick up.

## 2022-11-15 DIAGNOSIS — E109 Type 1 diabetes mellitus without complications: Secondary | ICD-10-CM | POA: Diagnosis not present

## 2022-11-18 DIAGNOSIS — M25561 Pain in right knee: Secondary | ICD-10-CM | POA: Diagnosis not present

## 2022-11-20 DIAGNOSIS — M25562 Pain in left knee: Secondary | ICD-10-CM | POA: Diagnosis not present

## 2022-11-26 ENCOUNTER — Telehealth: Payer: Self-pay

## 2022-11-26 DIAGNOSIS — I10 Essential (primary) hypertension: Secondary | ICD-10-CM

## 2022-11-26 DIAGNOSIS — E669 Obesity, unspecified: Secondary | ICD-10-CM

## 2022-11-26 MED ORDER — FENOFIBRATE 145 MG PO TABS
ORAL_TABLET | ORAL | 3 refills | Status: DC
Start: 2022-11-26 — End: 2023-11-28

## 2022-11-26 NOTE — Telephone Encounter (Signed)
Prescription Request  11/26/2022  LOV: 10/28/22  What is the name of the medication or equipment?  fenofibrate (TRICOR) 145 MG tablet   Have you contacted your pharmacy to request a refill? No   Which pharmacy would you like this sent to?  [id number] - [place] - [address] [place] [address] Phone: [phone] Fax: [phone]    Patient notified that their request is being sent to the clinical staff for review and that they should receive a response within 2 business days.   Please advise at Mobile [phone] (mobile)

## 2022-11-26 NOTE — Telephone Encounter (Signed)
Refill for Tricor sent in

## 2022-11-26 NOTE — Addendum Note (Signed)
Addended by: Sandre Kitty on: 11/26/2022 12:19 PM   Modules accepted: Orders

## 2022-11-27 DIAGNOSIS — Z79899 Other long term (current) drug therapy: Secondary | ICD-10-CM | POA: Diagnosis not present

## 2022-11-27 DIAGNOSIS — M0609 Rheumatoid arthritis without rheumatoid factor, multiple sites: Secondary | ICD-10-CM | POA: Diagnosis not present

## 2022-12-04 DIAGNOSIS — M25561 Pain in right knee: Secondary | ICD-10-CM | POA: Diagnosis not present

## 2022-12-09 ENCOUNTER — Ambulatory Visit: Payer: Medicare Other | Admitting: Family Medicine

## 2022-12-09 NOTE — Progress Notes (Deleted)
   Established Patient Office Visit  Subjective   [name], female    DOB: 06-Sep-1970  Age: 52 y.o. MRN: [id number]  No chief complaint on file.   HPI  DM2-foot exam.  She is wanting to taper off the semaglutide.  Is she tapering down?  Does she need new prescriptions?  Rheumatology-new appointment?   The 10-year ASCVD risk score (Arnett DK, et al., 2019) is: 1.8%  Health Maintenance Due  Topic Date Due   DTaP/Tdap/Td (1 - Tdap) Never done   Zoster Vaccines- Shingrix (1 of 2) Never done   FOOT EXAM  05/23/2022   COVID-19 Vaccine (4 - 2023-24 season) 09/15/2022      Objective:     There were no vitals taken for this visit. {Vitals History (Optional):23777}  Physical Exam   No results found for any visits on 12/09/22.      Assessment & Plan:   There are no diagnoses linked to this encounter.   No follow-ups on file.    Sandre Kitty, MD

## 2022-12-11 ENCOUNTER — Ambulatory Visit: Payer: Self-pay | Admitting: Family Medicine

## 2022-12-11 ENCOUNTER — Telehealth: Payer: Medicare Other | Admitting: Physician Assistant

## 2022-12-11 DIAGNOSIS — B372 Candidiasis of skin and nail: Secondary | ICD-10-CM | POA: Diagnosis not present

## 2022-12-11 MED ORDER — NYSTATIN 100000 UNIT/GM EX CREA
1.0000 | TOPICAL_CREAM | Freq: Two times a day (BID) | CUTANEOUS | 0 refills | Status: DC
Start: 2022-12-11 — End: 2023-04-09

## 2022-12-11 MED ORDER — FLUCONAZOLE 150 MG PO TABS: ORAL_TABLET | ORAL | 0 refills | Status: DC

## 2022-12-11 NOTE — Progress Notes (Signed)
Virtual Visit Consent   Laurena Bering, you are scheduled for a virtual visit with a [facility] provider today. Just as with appointments in the office, your consent must be obtained to participate. Your consent will be active for this visit and any virtual visit you may have with one of our providers in the next 365 days. If you have a MyChart account, a copy of this consent can be sent to you electronically.  As this is a virtual visit, video technology does not allow for your provider to perform a traditional examination. This may limit your provider's ability to fully assess your condition. If your provider identifies any concerns that need to be evaluated in person or the need to arrange testing (such as labs, EKG, etc.), we will make arrangements to do so. Although advances in technology are sophisticated, we cannot ensure that it will always work on either your end or our end. If the connection with a video visit is poor, the visit may have to be switched to a telephone visit. With either a video or telephone visit, we are not always able to ensure that we have a secure connection.  By engaging in this virtual visit, you consent to the provision of healthcare and authorize for your insurance to be billed (if applicable) for the services provided during this visit. Depending on your insurance coverage, you may receive a charge related to this service.  I need to obtain your verbal consent now. Are you willing to proceed with your visit today? Elizabeth Patrick has provided verbal consent on 12/11/2022 for a virtual visit (video or telephone). Piedad Climes, [place]  Date: 12/11/2022 3:22 PM  Virtual Visit via Video Note   I, Piedad Climes, connected with  EDGAR GIOVE  ([id number], [birth year]) on 12/11/22 at  3:15 PM EST by a video-enabled telemedicine application and verified that I am speaking with the correct person using two identifiers.  Location: Patient: Virtual Visit  Location Patient: Home Provider: Virtual Visit Location Provider: Home Office   I discussed the limitations of evaluation and management by telemedicine and the availability of in person appointments. The patient expressed understanding and agreed to proceed.    History of Present Illness: Elizabeth Patrick is a 52 y.o. who identifies as a female who was assigned female at birth, and is being seen today for rash under her abdomen over the past couple of weeks that was red and itchy and now with irritation and spread underneath the entirety of her pannus. Denies new onset fever, chills. Is applying OTC Desitin to the area with little relief. Concerned as she does not want to develop an infection.Marland Kitchen  HPI: HPI  Problems:  Patient Active Problem List   Diagnosis Date Noted   Chronic idiopathic constipation 09/06/2022   Gastroparesis 06/26/2022   Right hip pain 12/15/2021   DDD (degenerative disc disease), lumbosacral 12/15/2021   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/2021   Vitamin B12 deficiency 07/23/2021   Severe sleep apnea 07/04/2021   Psoriasis and similar disorder 05/27/2021   Depression, major, single episode, moderate (HCC) 04/29/2021   Loud snoring 04/17/2021   Menopause 04/16/2021   Morbid obesity (HCC) 04/05/2021   Osteoarthritis of both knees 04/04/2021   Type 2 diabetes mellitus without complication, with long-term current use of insulin (HCC) 11/28/2020   Gastroesophageal reflux disease 11/28/2020   Recurrent UTI 10/17/2020   Hyperlipidemia associated with type 2 diabetes mellitus (HCC) 10/11/2020   Lymphedema 10/11/2020   Diabetic  polyneuropathy associated with type 2 diabetes mellitus (HCC) 10/11/2020   NASH (nonalcoholic steatohepatitis) 10/10/2020   Rheumatoid arthritis (HCC) 09/26/2020   Hypertension associated with diabetes (HCC) 09/26/2020   Osteoarthritis 09/26/2020    Allergies:  Allergies  Allergen Reactions   Alcohol Anaphylaxis   Dilantin [Phenytoin] Anaphylaxis    Methotrexate     Other Reaction(s): oral ulcers, nausea   Medications:  Current Outpatient Medications:    fluconazole (DIFLUCAN) 150 MG tablet, Take 1 tablet PO once. Repeat in 3 days if needed., Disp: 2 tablet, Rfl: 0   nystatin cream (MYCOSTATIN), Apply 1 Application topically 2 (two) times daily., Disp: 30 g, Rfl: 0   acyclovir (ZOVIRAX) 400 MG tablet, Take 1 tablet (400 mg total) by mouth 2 (two) times daily., Disp: 60 tablet, Rfl: 3   amLODipine (NORVASC) 10 MG tablet, TAKE 1 TABLET BY MOUTH DAILY, Disp: 90 tablet, Rfl: 1   atorvastatin (LIPITOR) 10 MG tablet, Take 1 tablet (10 mg total) by mouth daily., Disp: 90 tablet, Rfl: 3   B Complex-C (SUPER B-C PO), Super B-C, Disp: , Rfl:    Blood Pressure Monitor DEVI, 1 Device by Does not apply route daily., Disp: 1 each, Rfl: 0   clobetasol cream (TEMOVATE) 0.05 %, Apply 1 application. topically 2 (two) times daily., Disp: 60 g, Rfl: 3   Cranberry 1000 MG CAPS, , Disp: , Rfl:    esomeprazole (NEXIUM) 40 MG capsule, Take 1 capsule (40 mg total) by mouth daily., Disp: 90 capsule, Rfl: 3   estradiol (ESTRACE) 0.1 MG/GM vaginal cream, Estrogen Cream Instruction Discard applicator Apply pea sized amount to tip of finger to urethra before bed. Wash hands well after application. Use Monday, Wednesday and Friday, Disp: 42.5 g, Rfl: 11   estradiol (VIVELLE-DOT) 0.0375 MG/24HR, Place 1 patch onto the skin 2 (two) times a week., Disp: 24 patch, Rfl: 3   fenofibrate (TRICOR) 145 MG tablet, TAKE 1 TABLET BY MOUTH EVERY DAY WITH DINNER, Disp: 90 tablet, Rfl: 3   HYDROcodone-acetaminophen (NORCO/VICODIN) 5-325 MG tablet, Take 1 tablet by mouth every 8 (eight) hours as needed., Disp: , Rfl:    hydroxychloroquine (PLAQUENIL) 200 MG tablet, TAKE 1 TABLET BY MOUTH TWICE DAILY for 30, Disp: , Rfl:    ketoconazole (NIZORAL) 2 % cream, Apply topically 2 (two) times daily., Disp: , Rfl:    Lidocaine (BLUE-EMU PAIN RELIEF DRY EX), Apply topically., Disp: , Rfl:     losartan (COZAAR) 100 MG tablet, TAKE 1 TABLET(100 MG) BY MOUTH DAILY, Disp: 90 tablet, Rfl: 1   metoprolol succinate (TOPROL-XL) 25 MG 24 hr tablet, Take 1 tablet (25 mg total) by mouth daily., Disp: 90 tablet, Rfl: 3   pregabalin (LYRICA) 225 MG capsule, Take 225 mg by mouth 3 (three) times daily., Disp: , Rfl:    progesterone (PROMETRIUM) 100 MG capsule, Take 1 capsule (100 mg total) by mouth daily., Disp: 90 capsule, Rfl: 3   Semaglutide, 2 MG/DOSE, 8 MG/3ML SOPN, Inject 2 mg as directed once a week., Disp: 3 mL, Rfl: 4   tiZANidine (ZANAFLEX) 2 MG tablet, Take by mouth every 6 (six) hours as needed for muscle spasms., Disp: , Rfl:    Tocilizumab (ACTEMRA IV), Inject into the vein., Disp: , Rfl:   Observations/Objective: Patient is well-developed, well-nourished in no acute distress.  Resting comfortably  at home.  Head is normocephalic, atraumatic.  No labored breathing.  Speech is clear and coherent with logical content.  Patient is alert and oriented at baseline.  Erythematous and shiny appearance underneath pannus with scattered small erythematous satellite lesions, concerning for yeast.   Assessment and Plan: 1. Yeast infection of the skin - nystatin cream (MYCOSTATIN); Apply 1 Application topically 2 (two) times daily.  Dispense: 30 g; Refill: 0 - fluconazole (DIFLUCAN) 150 MG tablet; Take 1 tablet PO once. Repeat in 3 days if needed.  Dispense: 2 tablet; Refill: 0  Supportive measures and skin care reviewed with patient. Start Nystatin topical and diflucan. Follow-up in person with PCP. Signs/symptoms of secondary cellulitis reviewed that would prompt need for re-evaluation.  Follow Up Instructions: I discussed the assessment and treatment plan with the patient. The patient was provided an opportunity to ask questions and all were answered. The patient agreed with the plan and demonstrated an understanding of the instructions.  A copy of instructions were sent to the patient via  MyChart unless otherwise noted below.   The patient was advised to call back or seek an in-person evaluation if the symptoms worsen or if the condition fails to improve as anticipated.    Piedad Climes, PA-C

## 2022-12-11 NOTE — Telephone Encounter (Signed)
Copied from CRM [id number]. Topic: Clinical - Red Word Triage >> Dec 11, 2022 11:29 AM Orinda Kenner C wrote: Red Word that prompted transfer to Nurse Triage: Rash got worse, auto immune and diabetics wants to speak with nurse before symptoms worsen for the holidays.  Chief Complaint:  Rash has gotten worse. Patient - rash under her lowere stomach(fupa) Symptoms:   a few bleeding spots  under her abdomen(fupa) spreading to her groin  Frequency:  Over night it got worse because  menopause- Spreaded from one    side to the oper. There are some open spots. Disposition: [] ED /[] Urgent Care (no appt availability in office) / [x] Appointment(In office/virtual)/ []  [facility]/ [] Home Care/ [] Refused Recommended Disposition /[] [facility]/ []  Follow-up with PCP Additional Notes:  Patient was scheduled with  Virtual Urgent Care  ( on demand) for today due to patient having diagnosis of RA and Diabetes. Reason for Disposition . [1] Looks infected (spreading redness, pus) AND [2] no fever  Answer Assessment - Initial Assessment Questions 1. APPEARANCE of RASH: "Describe the rash."      Red light pink, pin prick  size rash . Where the folds are very deep is when the skin 2. LOCATION: "Where is the rash located?"  Under lower stomach (Fupa)       3. NUMBER: "How many spots are there?"       From one  side to the other -  spots  less than and inches 2-3  4. SIZE: "How big are the spots?" (Inches, centimeters or compare to size of a coin)      Less than and inches 2-3   5. ONSET: "When did the rash start?"  Last week but has it  on and off      6. ITCHING: "Does the rash itch?" If Yes, ask: "How bad is the itch?"  (Scale 0-10; or none, mild, moderate, severe)        Rates  itch 4-5   out of 10   Moderate Severe 7. PAIN: "Does the rash hurt?" If Yes, ask: "How bad is the pain?"  (Scale 0-10; or none, mild, moderate, severe)    - NONE (0): no pain    - MILD (1-3): doesn't interfere with  normal activities     - MODERATE (4-7): interferes with normal activities or awakens from sleep      8. OTHER SYMPTOMS: "Do you have any other symptoms?" (e.g., fever)      Area is hot and  9. PREGNANCY: "Is there any chance you are pregnant?" "When was your last menstrual period?" Denies  Protocols used: Rash or Redness - Localized-A-AH

## 2022-12-11 NOTE — Patient Instructions (Signed)
Elizabeth Patrick, thank you for joining Piedad Climes, PA-C for today's virtual visit.  While this provider is not your primary care provider (PCP), if your PCP is located in our provider database this encounter information will be shared with them immediately following your visit.   A [facility] MyChart account gives you access to today's visit and all your visits, tests, and labs performed at [facility] " click here if you don't have a Elizabeth Patrick MyChart account or go to [email or web address]  Consent: (Patient) Elizabeth Patrick provided verbal consent for this virtual visit at the beginning of the encounter.  Current Medications:  Current Outpatient Medications:    fluconazole (DIFLUCAN) 150 MG tablet, Take 1 tablet PO once. Repeat in 3 days if needed., Disp: 2 tablet, Rfl: 0   nystatin cream (MYCOSTATIN), Apply 1 Application topically 2 (two) times daily., Disp: 30 g, Rfl: 0   acyclovir (ZOVIRAX) 400 MG tablet, Take 1 tablet (400 mg total) by mouth 2 (two) times daily., Disp: 60 tablet, Rfl: 3   amLODipine (NORVASC) 10 MG tablet, TAKE 1 TABLET BY MOUTH DAILY, Disp: 90 tablet, Rfl: 1   atorvastatin (LIPITOR) 10 MG tablet, Take 1 tablet (10 mg total) by mouth daily., Disp: 90 tablet, Rfl: 3   B Complex-C (SUPER B-C PO), Super B-C, Disp: , Rfl:    Blood Pressure Monitor DEVI, 1 Device by Does not apply route daily., Disp: 1 each, Rfl: 0   clobetasol cream (TEMOVATE) 0.05 %, Apply 1 application. topically 2 (two) times daily., Disp: 60 g, Rfl: 3   Cranberry 1000 MG CAPS, , Disp: , Rfl:    esomeprazole (NEXIUM) 40 MG capsule, Take 1 capsule (40 mg total) by mouth daily., Disp: 90 capsule, Rfl: 3   estradiol (ESTRACE) 0.1 MG/GM vaginal cream, Estrogen Cream Instruction Discard applicator Apply pea sized amount to tip of finger to urethra before bed. Wash hands well after application. Use Monday, Wednesday and Friday, Disp: 42.5 g, Rfl: 11   estradiol (VIVELLE-DOT) 0.0375  MG/24HR, Place 1 patch onto the skin 2 (two) times a week., Disp: 24 patch, Rfl: 3   fenofibrate (TRICOR) 145 MG tablet, TAKE 1 TABLET BY MOUTH EVERY DAY WITH DINNER, Disp: 90 tablet, Rfl: 3   HYDROcodone-acetaminophen (NORCO/VICODIN) 5-325 MG tablet, Take 1 tablet by mouth every 8 (eight) hours as needed., Disp: , Rfl:    hydroxychloroquine (PLAQUENIL) 200 MG tablet, TAKE 1 TABLET BY MOUTH TWICE DAILY for 30, Disp: , Rfl:    ketoconazole (NIZORAL) 2 % cream, Apply topically 2 (two) times daily., Disp: , Rfl:    Lidocaine (BLUE-EMU PAIN RELIEF DRY EX), Apply topically., Disp: , Rfl:    losartan (COZAAR) 100 MG tablet, TAKE 1 TABLET(100 MG) BY MOUTH DAILY, Disp: 90 tablet, Rfl: 1   metoprolol succinate (TOPROL-XL) 25 MG 24 hr tablet, Take 1 tablet (25 mg total) by mouth daily., Disp: 90 tablet, Rfl: 3   pregabalin (LYRICA) 225 MG capsule, Take 225 mg by mouth 3 (three) times daily., Disp: , Rfl:    progesterone (PROMETRIUM) 100 MG capsule, Take 1 capsule (100 mg total) by mouth daily., Disp: 90 capsule, Rfl: 3   Semaglutide, 2 MG/DOSE, 8 MG/3ML SOPN, Inject 2 mg as directed once a week., Disp: 3 mL, Rfl: 4   tiZANidine (ZANAFLEX) 2 MG tablet, Take by mouth every 6 (six) hours as needed for muscle spasms., Disp: , Rfl:    Tocilizumab (ACTEMRA IV), Inject into the vein., Disp: , Rfl:  Medications ordered in this encounter:  Meds ordered this encounter  Medications   nystatin cream (MYCOSTATIN)    Sig: Apply 1 Application topically 2 (two) times daily.    Dispense:  30 g    Refill:  0    Order Specific Question:   Supervising Provider    Answer:   Merrilee Jansky [[id number]]   fluconazole (DIFLUCAN) 150 MG tablet    Sig: Take 1 tablet PO once. Repeat in 3 days if needed.    Dispense:  2 tablet    Refill:  0    Order Specific Question:   Supervising Provider    Answer:   Merrilee Jansky [id number]     *If you need refills on other medications prior to your next appointment, please  contact your pharmacy*  Follow-Up: Call back or seek an in-person evaluation if the symptoms worsen or if the condition fails to improve as anticipated.  [facility] [phone]  Other Instructions Continue to try and keep the skin clean and dry. Apply thin layer of the nystatin as directed. Take the diflucan as directed. You can apply think layer of a barrier cream over the nystatin cream as well to protect the skin. If not improving over the weekend, or any new/worsening symptoms, you need re-evaluation ASAP.   If you have been instructed to have an in-person evaluation today at a local Urgent Care facility, please use the link below. It will take you to a list of all of our available  [facility], including address, phone number and hours of operation. Please do not delay care.  [facility]  If you or a family member do not have a primary care provider, use the link below to schedule a visit and establish care. When you choose a [facility] primary care physician or advanced practice provider, you gain a long-term partner in health. Find a Primary Care Provider  Learn more about [facility] in-office and virtual care options: [facility] - Get Care Now

## 2022-12-15 DIAGNOSIS — E109 Type 1 diabetes mellitus without complications: Secondary | ICD-10-CM | POA: Diagnosis not present

## 2022-12-16 ENCOUNTER — Ambulatory Visit (INDEPENDENT_AMBULATORY_CARE_PROVIDER_SITE_OTHER): Payer: Medicare Other | Admitting: Family Medicine

## 2022-12-16 ENCOUNTER — Encounter: Payer: Self-pay | Admitting: Family Medicine

## 2022-12-16 VITALS — BP 127/67 | HR 61 | Ht 64.5 in | Wt 336.1 lb

## 2022-12-16 DIAGNOSIS — L304 Erythema intertrigo: Secondary | ICD-10-CM | POA: Insufficient documentation

## 2022-12-16 DIAGNOSIS — Z794 Long term (current) use of insulin: Secondary | ICD-10-CM | POA: Diagnosis not present

## 2022-12-16 DIAGNOSIS — M47816 Spondylosis without myelopathy or radiculopathy, lumbar region: Secondary | ICD-10-CM | POA: Diagnosis not present

## 2022-12-16 DIAGNOSIS — E119 Type 2 diabetes mellitus without complications: Secondary | ICD-10-CM | POA: Diagnosis not present

## 2022-12-16 DIAGNOSIS — M170 Bilateral primary osteoarthritis of knee: Secondary | ICD-10-CM | POA: Diagnosis not present

## 2022-12-16 LAB — POCT GLYCOSYLATED HEMOGLOBIN (HGB A1C): HbA1c POC (<> result, manual entry): 5.2 % (ref 4.0–5.6)

## 2022-12-16 NOTE — Patient Instructions (Signed)
It was nice to see you today,  We addressed the following topics today: -Continue tapering off your Ozempic. - You can check your A1c results on MyChart - Follow-up with me in 3 months - Continue to use the nystatin cream until the rash resolves.  Afterwards switch to something like Desitin or Boudreau's Butt paste daily.  Try to keep that area dry.  Have a great day,  Frederic Jericho, MD

## 2022-12-16 NOTE — Assessment & Plan Note (Signed)
A1c rechecked today -5.2%.  Continue to wean down off of Ozempic.  Once she has been off of it we can recheck the A1c again in 3 months.  At that time can we decide if she needs additional medications for blood sugar control.  Foot exam shows some edema and mildly decreased sensation, but otherwise normal.

## 2022-12-16 NOTE — Progress Notes (Unsigned)
   Established Patient Office Visit  Subjective   [name], female    DOB: 02/25/1970  Age: 52 y.o. MRN: [id number]  Chief Complaint  Patient presents with   Rash    HPI  DM2-patient continuing to taper off the semaglutide.  She is currently on a half a milligram dose and we will taper back down to 0.25 mg before stopping completely.  She would like to have her blood sugar checked today.  She has been checking her fasting blood sugars at home and they have been around 125.  Gastrointestinal symptoms are much improved after decreasing her dose of semaglutide.  Patient states she has not tolerated metformin in the past.  Caused significant headaches within a few hours of taking it.  Patient complaining of a rash under her belly.  She states she talked to a telemedicine provider about this and was prescribed nystatin.  Has not improvement in rash with nystatin.  Has had this rash before and usually prescribes Boudreau's Butt paste and this is generally helpful but not at this time.  Also tries to keep the area dry.  Advised her against use of powders due to the maceration affect from them.   The 10-year ASCVD risk score (Arnett DK, et al., 2019) is: 2.6%  Health Maintenance Due  Topic Date Due   DTaP/Tdap/Td (1 - Tdap) Never done   Zoster Vaccines- Shingrix (1 of 2) Never done   COVID-19 Vaccine (4 - 2023-24 season) 09/15/2022      Objective:     BP 127/67   Pulse 61   Ht 5' 4.5" (1.638 m)   Wt (!) 336 lb 1.9 oz (152.5 kg)   SpO2 98%   BMI 56.80 kg/m  {Vitals History (Optional):23777}  Physical Exam General: Alert, oriented CV: Regular rhythm Pulmonary: Lungs clear bilaterally Extremities: Nonpitting edema of the feet bilaterally.  Mildly decreased sensation to pinprick in the feet bilaterally.  Pulses equal bilaterally.   Results for orders placed or performed in visit on 12/16/22  POCT HgB A1C  Result Value Ref Range   Hemoglobin A1C     HbA1c POC  (<> result, manual entry) 5.2 4.0 - 5.6 %   HbA1c, POC (prediabetic range)     HbA1c, POC (controlled diabetic range)          Assessment & Plan:   Type 2 diabetes mellitus without complication, with long-term current use of insulin (HCC) Assessment & Plan: A1c rechecked today -5.2%.  Continue to wean down off of Ozempic.  Once she has been off of it we can recheck the A1c again in 3 months.  At that time can we decide if she needs additional medications for blood sugar control.  Foot exam shows some edema and mildly decreased sensation, but otherwise normal.  Orders: -     POCT glycosylated hemoglobin (Hb A1C)  [facility] Assessment & Plan: Patient has a horizontal rash in the suprapubic region at the site of skin folds.  Continue putting nystatin cream on it until rash resolves, then followed up by daily Desitin or Boudreau's Butt paste.  Keep area dry.  Avoid powders.      Return in about 3 months (around 03/16/2023) for DM.    Elizabeth Kitty, MD

## 2022-12-16 NOTE — Assessment & Plan Note (Signed)
Patient has a horizontal rash in the suprapubic region at the site of skin folds.  Continue putting nystatin cream on it until rash resolves, then followed up by daily Desitin or Boudreau's Butt paste.  Keep area dry.  Avoid powders.

## 2022-12-25 DIAGNOSIS — M0609 Rheumatoid arthritis without rheumatoid factor, multiple sites: Secondary | ICD-10-CM | POA: Diagnosis not present

## 2022-12-31 DIAGNOSIS — M79641 Pain in right hand: Secondary | ICD-10-CM | POA: Diagnosis not present

## 2022-12-31 DIAGNOSIS — M0609 Rheumatoid arthritis without rheumatoid factor, multiple sites: Secondary | ICD-10-CM | POA: Diagnosis not present

## 2022-12-31 DIAGNOSIS — M79642 Pain in left hand: Secondary | ICD-10-CM | POA: Diagnosis not present

## 2022-12-31 DIAGNOSIS — K760 Fatty (change of) liver, not elsewhere classified: Secondary | ICD-10-CM | POA: Diagnosis not present

## 2022-12-31 DIAGNOSIS — M5412 Radiculopathy, cervical region: Secondary | ICD-10-CM | POA: Diagnosis not present

## 2022-12-31 DIAGNOSIS — G894 Chronic pain syndrome: Secondary | ICD-10-CM | POA: Diagnosis not present

## 2023-01-06 ENCOUNTER — Encounter: Payer: Self-pay | Admitting: Family Medicine

## 2023-01-06 ENCOUNTER — Other Ambulatory Visit: Payer: Self-pay | Admitting: Family Medicine

## 2023-01-06 MED ORDER — PREDNISONE 10 MG (21) PO TBPK: ORAL_TABLET | ORAL | 0 refills | Status: DC

## 2023-01-15 DIAGNOSIS — E109 Type 1 diabetes mellitus without complications: Secondary | ICD-10-CM | POA: Diagnosis not present

## 2023-01-16 ENCOUNTER — Telehealth: Payer: Self-pay

## 2023-01-16 NOTE — Telephone Encounter (Signed)
 Reason for CRM: Patient needs to have a walker ordered//Her rheumatologist advised she get a rolling walker with a seat//

## 2023-01-16 NOTE — Telephone Encounter (Unsigned)
 Copied from CRM [id number]. Topic: Clinical - Medical Advice >> Jan 16, 2023  1:34 PM Carlatta H wrote: Reason for CRM: Patient needs to have a walker ordered//Her rheumatologist advised she get a rolling walker with a seat//

## 2023-01-17 ENCOUNTER — Other Ambulatory Visit: Payer: Self-pay | Admitting: Family Medicine

## 2023-01-17 DIAGNOSIS — M069 Rheumatoid arthritis, unspecified: Secondary | ICD-10-CM

## 2023-01-17 NOTE — Telephone Encounter (Signed)
-  Rolling walker with seat ordered

## 2023-01-21 NOTE — Telephone Encounter (Signed)
 This has been sent in community message, they will work on getting this for the patient.

## 2023-01-22 DIAGNOSIS — M0609 Rheumatoid arthritis without rheumatoid factor, multiple sites: Secondary | ICD-10-CM | POA: Diagnosis not present

## 2023-01-23 ENCOUNTER — Encounter: Payer: Self-pay | Admitting: Family Medicine

## 2023-01-31 DIAGNOSIS — M79645 Pain in left finger(s): Secondary | ICD-10-CM | POA: Diagnosis not present

## 2023-01-31 DIAGNOSIS — M1812 Unilateral primary osteoarthritis of first carpometacarpal joint, left hand: Secondary | ICD-10-CM | POA: Diagnosis not present

## 2023-02-04 ENCOUNTER — Ambulatory Visit: Payer: Self-pay | Admitting: Family Medicine

## 2023-02-04 NOTE — Telephone Encounter (Signed)
Contacted pt to let her know that we are looking into this.  I have sent another message to inquire.  Received this message the first time.  I have sent another message to see if I can get contact information to provide for the patient to follow up with this.  Routing to PCP as an Elizabeth Patrick, Elizabeth Patrick, Elizabeth Patrick, [place]; Elizabeth Patrick; Angus Seller, Elizabeth Patrick, Yes we have tried reaching out for delivery confirmation but have been unable to reach her. I have asked for them to reach out again.

## 2023-02-04 NOTE — Telephone Encounter (Signed)
Copied from CRM [id number]. Topic: General - Other >> Feb 04, 2023  9:33 AM Dimitri Ped wrote: Reason for CRM: patient is calling she has 2 questions for the nurse  Patient had a question about her upcoming surgery. Patient advised to contact her surgeon for instructions. Patient also asked about status of walker order. Order is active and shows as "not released," I advised the patient I would forward her concern about the walker to the clinic for follow up. Patient had no other questions or concerns at this time.   Reason for Disposition  [1] Caller requesting NON-URGENT health information AND [2] PCP's office is the best resource  Answer Assessment - Initial Assessment Questions 1. REASON FOR CALL or QUESTION: "What is your reason for calling today?" or "How can I best help you?" or "What question do you have that I can help answer?"     Patient had questions about upcoming surgery and about an order for a walker..  Protocols used: Information Only Call - No Triage-A-AH

## 2023-02-05 DIAGNOSIS — G4733 Obstructive sleep apnea (adult) (pediatric): Secondary | ICD-10-CM | POA: Diagnosis not present

## 2023-02-05 DIAGNOSIS — M059 Rheumatoid arthritis with rheumatoid factor, unspecified: Secondary | ICD-10-CM | POA: Diagnosis not present

## 2023-02-06 ENCOUNTER — Ambulatory Visit: Payer: Medicare Other | Admitting: Urology

## 2023-02-11 ENCOUNTER — Encounter (HOSPITAL_COMMUNITY): Payer: Self-pay | Admitting: Orthopedic Surgery

## 2023-02-11 ENCOUNTER — Other Ambulatory Visit: Payer: Self-pay

## 2023-02-11 NOTE — Progress Notes (Addendum)
PCP - Sandre Kitty, MD  Cardiologist -   PPM/ICD - denies Device Orders - n/a Rep Notified - n/a  Chest x-ray - denies EKG - DOS Stress Test -  ECHO - 05-02-21 Cardiac Cath -   CPAP - Sleep test 08-29-21 unable to tolerated CPAP    Fasting Blood Sugar - per patient blood sugars are around 100  Checks Blood Sugar 1-2 times a week  Blood Thinner Instructions: denies Aspirin Instructions: n/a  ERAS Protcol - clear liquids until 9:30  COVID TEST- n/a  Anesthesia review: Yes hx of DM, HTN, unable to tolerate CPAP  Patient verbally denies any shortness of breath, fever, cough and chest pain during phone call   -------------  SDW INSTRUCTIONS given:  Your procedure is scheduled on February 13, 2023.  Report to [facility] Main Entrance "A" at 10:00 A.M., and check in at the Admitting office.  Call this number if you have problems the morning of surgery:  [phone]   Remember:  Do not eat after midnight the night before your surgery  You may drink clear liquids until 9:35 the morning of your surgery.   Clear liquids allowed are: Water, Non-Citrus Juices (without pulp), Carbonated Beverages, Clear Tea, Black Coffee Only, and Gatorade    Take these medicines the morning of surgery with A SIP OF WATER  amLODipine (NORVASC)  atorvastatin (LIPITOR)  esomeprazole (NEXIUM)  estradiol (VIVELLE-DOT) 0.0375 MG/24HR REMOVE PATCH wash area and dry pregabalin (LYRICA) progesterone (PROMETRIUM)   IF NEEDED acetaminophen (TYLENOL)  HYDROcodone-acetaminophen (NORCO/VICODIN)  tiZANidine (ZANAFLEX)    As of today, STOP taking any Aspirin (unless otherwise instructed by your surgeon) Aleve, Naproxen, Ibuprofen, Motrin, Advil, Goody's, BC's, all herbal medications, fish oil, and all vitamins.                      Do not wear jewelry, make up, or nail polish            Do not wear lotions, powders, perfumes/colognes, or deodorant.            Do not shave 48 hours prior to  surgery.  Men may shave face and neck.            Do not bring valuables to the hospital.            [facility] is not responsible for any belongings or valuables.  Do NOT Smoke (Tobacco/Vaping) 24 hours prior to your procedure If you use a CPAP at night, you may bring all equipment for your overnight stay.   Contacts, glasses, dentures or bridgework may not be worn into surgery.      For patients admitted to the hospital, discharge time will be determined by your treatment team.   Patients discharged the day of surgery will not be allowed to drive home, and someone needs to stay with them for 24 hours.    Special instructions:   [place]- Preparing For Surgery  Before surgery, you can play an important role. Because skin is not sterile, your skin needs to be as free of germs as possible. You can reduce the number of germs on your skin by washing with CHG (chlorahexidine gluconate) Soap before surgery.  CHG is an antiseptic cleaner which kills germs and bonds with the skin to continue killing germs even after washing.    Oral Hygiene is also important to reduce your risk of infection.  Remember - BRUSH YOUR TEETH THE MORNING OF SURGERY WITH  YOUR REGULAR TOOTHPASTE  Please do not use if you have an allergy to CHG or antibacterial soaps. If your skin becomes reddened/irritated stop using the CHG.  Do not shave (including legs and underarms) for at least 48 hours prior to first CHG shower. It is OK to shave your face.  Please follow these instructions carefully.   Shower the NIGHT BEFORE SURGERY and the MORNING OF SURGERY with DIAL Soap.   Pat yourself dry with a CLEAN TOWEL.  Wear CLEAN PAJAMAS to bed the night before surgery  Place CLEAN SHEETS on your bed the night of your first shower and DO NOT SLEEP WITH PETS.   Day of Surgery: Please shower morning of surgery  Wear Clean/Comfortable clothing the morning of surgery Do not apply any deodorants/lotions.   Remember to  brush your teeth WITH YOUR REGULAR TOOTHPASTE.   Questions were answered. Patient verbalized understanding of instructions.

## 2023-02-13 ENCOUNTER — Ambulatory Visit (HOSPITAL_COMMUNITY): Payer: Medicare Other

## 2023-02-13 ENCOUNTER — Other Ambulatory Visit: Payer: Self-pay

## 2023-02-13 ENCOUNTER — Encounter (HOSPITAL_COMMUNITY)
Admission: RE | Disposition: A | Discharge status: Home / Self Care | Payer: Self-pay | Source: Home / Self Care | Attending: Orthopedic Surgery

## 2023-02-13 ENCOUNTER — Ambulatory Visit (HOSPITAL_COMMUNITY): Payer: Medicare Other | Admitting: Anesthesiology

## 2023-02-13 ENCOUNTER — Encounter (HOSPITAL_COMMUNITY): Payer: Self-pay | Admitting: Orthopedic Surgery

## 2023-02-13 ENCOUNTER — Ambulatory Visit (HOSPITAL_COMMUNITY)
Admission: RE | Admit: 2023-02-13 | Discharge: 2023-02-13 | Disposition: A | Discharge status: Home / Self Care | Payer: Medicare Other | Attending: Orthopedic Surgery | Admitting: Orthopedic Surgery

## 2023-02-13 DIAGNOSIS — E119 Type 2 diabetes mellitus without complications: Secondary | ICD-10-CM | POA: Insufficient documentation

## 2023-02-13 DIAGNOSIS — I10 Essential (primary) hypertension: Secondary | ICD-10-CM | POA: Diagnosis not present

## 2023-02-13 DIAGNOSIS — G4733 Obstructive sleep apnea (adult) (pediatric): Secondary | ICD-10-CM

## 2023-02-13 DIAGNOSIS — Z87891 Personal history of nicotine dependence: Secondary | ICD-10-CM | POA: Insufficient documentation

## 2023-02-13 DIAGNOSIS — M069 Rheumatoid arthritis, unspecified: Secondary | ICD-10-CM | POA: Insufficient documentation

## 2023-02-13 DIAGNOSIS — M1812 Unilateral primary osteoarthritis of first carpometacarpal joint, left hand: Secondary | ICD-10-CM | POA: Insufficient documentation

## 2023-02-13 DIAGNOSIS — G8918 Other acute postprocedural pain: Secondary | ICD-10-CM | POA: Diagnosis not present

## 2023-02-13 DIAGNOSIS — K219 Gastro-esophageal reflux disease without esophagitis: Secondary | ICD-10-CM | POA: Insufficient documentation

## 2023-02-13 HISTORY — DX: Essential (primary) hypertension: I10

## 2023-02-13 HISTORY — DX: Other complications of anesthesia, initial encounter: T88.59XA

## 2023-02-13 HISTORY — DX: Gastro-esophageal reflux disease without esophagitis: K21.9

## 2023-02-13 HISTORY — PX: CARPOMETACARPEL SUSPENSION PLASTY: SHX5005

## 2023-02-13 HISTORY — PX: CLOSED REDUCTION FINGER WITH PERCUTANEOUS PINNING: SHX5612

## 2023-02-13 HISTORY — DX: Other specified postprocedural states: Z98.890

## 2023-02-13 HISTORY — DX: Other specified postprocedural states: R11.2

## 2023-02-13 HISTORY — DX: Nausea with vomiting, unspecified: Z98.890

## 2023-02-13 LAB — CBC
HCT: 42.3 % (ref 36.0–46.0)
Hemoglobin: 14.2 g/dL (ref 12.0–15.0)
MCH: 32 pg (ref 26.0–34.0)
MCHC: 33.6 g/dL (ref 30.0–36.0)
MCV: 95.3 fL (ref 80.0–100.0)
Platelets: 176 10*3/uL (ref 150–400)
RBC: 4.44 MIL/uL (ref 3.87–5.11)
RDW: 12.7 % (ref 11.5–15.5)
WBC: 4.6 10*3/uL (ref 4.0–10.5)
nRBC: 0 % (ref 0.0–0.2)

## 2023-02-13 LAB — BASIC METABOLIC PANEL
Anion gap: 11 (ref 5–15)
BUN: 20 mg/dL (ref 6–20)
CO2: 23 mmol/L (ref 22–32)
Calcium: 9.4 mg/dL (ref 8.9–10.3)
Chloride: 108 mmol/L (ref 98–111)
Creatinine, Ser: 0.9 mg/dL (ref 0.44–1.00)
GFR, Estimated: >60 mL/min (ref >60)
Glucose, Bld: 94 mg/dL (ref 70–99)
Potassium: 4.1 mmol/L (ref 3.5–5.1)
Sodium: 142 mmol/L (ref 135–145)

## 2023-02-13 LAB — GLUCOSE, CAPILLARY
Glucose-Capillary: 93 mg/dL (ref 70–99)
Glucose-Capillary: 86 mg/dL (ref 70–99)
Glucose-Capillary: 94 mg/dL (ref 70–99)

## 2023-02-13 SURGERY — CARPOMETACARPEL (CMC) SUSPENSION PLASTY
Anesthesia: General | Site: Finger | Laterality: Left

## 2023-02-13 MED ORDER — INSULIN ASPART 100 UNIT/ML IJ SOLN: 0.0000 [IU] | INTRAMUSCULAR | Status: DC | PRN

## 2023-02-13 MED ORDER — CEFAZOLIN SODIUM-DEXTROSE 3-4 GM/150ML-% IV SOLN
3.0000 g | INTRAVENOUS | Status: AC
  Administered 2023-02-13: 3 g via INTRAVENOUS

## 2023-02-13 MED ORDER — 0.9 % SODIUM CHLORIDE (POUR BTL) OPTIME
TOPICAL | Status: DC | PRN
  Administered 2023-02-13: 1000 mL

## 2023-02-13 MED ORDER — FENTANYL CITRATE PF 50 MCG/ML IJ SOSY
50.0000 ug | PREFILLED_SYRINGE | Freq: Once | INTRAMUSCULAR | Status: AC
  Administered 2023-02-13: 50 ug via INTRAVENOUS
  Filled 2023-02-13: qty 1

## 2023-02-13 MED ORDER — OXYCODONE HCL 5 MG PO TABS: 5.0000 mg | ORAL_TABLET | Freq: Four times a day (QID) | ORAL | 0 refills | Status: AC | PRN

## 2023-02-13 MED ORDER — DEXAMETHASONE SODIUM PHOSPHATE 10 MG/ML IJ SOLN
INTRAMUSCULAR | Status: DC | PRN
  Administered 2023-02-13: 4 mg via INTRAVENOUS

## 2023-02-13 MED ORDER — GLYCOPYRROLATE PF 0.2 MG/ML IJ SOSY
PREFILLED_SYRINGE | INTRAMUSCULAR | Status: AC
  Filled 2023-02-13: qty 1

## 2023-02-13 MED ORDER — LIDOCAINE 2% (20 MG/ML) 5 ML SYRINGE
INTRAMUSCULAR | Status: DC | PRN
  Administered 2023-02-13: 50 mg via INTRAVENOUS

## 2023-02-13 MED ORDER — DEXAMETHASONE SODIUM PHOSPHATE 10 MG/ML IJ SOLN
INTRAMUSCULAR | Status: AC
  Filled 2023-02-13: qty 1

## 2023-02-13 MED ORDER — ONDANSETRON HCL 4 MG/2ML IJ SOLN
INTRAMUSCULAR | Status: DC | PRN
  Administered 2023-02-13: 4 mg via INTRAVENOUS

## 2023-02-13 MED ORDER — AMISULPRIDE (ANTIEMETIC) 5 MG/2ML IV SOLN: 10.0000 mg | Freq: Once | INTRAVENOUS | Status: DC | PRN

## 2023-02-13 MED ORDER — GLYCOPYRROLATE PF 0.2 MG/ML IJ SOSY
PREFILLED_SYRINGE | INTRAMUSCULAR | Status: DC | PRN
  Administered 2023-02-13: .2 mg via INTRAVENOUS

## 2023-02-13 MED ORDER — CHLORHEXIDINE GLUCONATE 0.12 % MT SOLN
15.0000 mL | Freq: Once | OROMUCOSAL | Status: AC
Start: 2023-02-13 — End: 2023-02-13

## 2023-02-13 MED ORDER — PROPOFOL 10 MG/ML IV BOLUS
INTRAVENOUS | Status: DC | PRN
  Administered 2023-02-13: 200 mg via INTRAVENOUS

## 2023-02-13 MED ORDER — ONDANSETRON HCL 4 MG/2ML IJ SOLN: 4.0000 mg | Freq: Once | INTRAMUSCULAR | Status: DC | PRN

## 2023-02-13 MED ORDER — LACTATED RINGERS IV SOLN: INTRAVENOUS | Status: DC

## 2023-02-13 MED ORDER — CEFAZOLIN SODIUM-DEXTROSE 3-4 GM/150ML-% IV SOLN
INTRAVENOUS | Status: AC
  Filled 2023-02-13: qty 150

## 2023-02-13 MED ORDER — BUPIVACAINE HCL (PF) 0.25 % IJ SOLN
INTRAMUSCULAR | Status: AC
  Filled 2023-02-13: qty 30

## 2023-02-13 MED ORDER — ACETAMINOPHEN 500 MG PO TABS: 1000.0000 mg | ORAL_TABLET | Freq: Once | ORAL | Status: DC

## 2023-02-13 MED ORDER — PROPOFOL 10 MG/ML IV BOLUS
INTRAVENOUS | Status: AC
  Filled 2023-02-13: qty 20

## 2023-02-13 MED ORDER — FENTANYL CITRATE (PF) 100 MCG/2ML IJ SOLN: 25.0000 ug | INTRAMUSCULAR | Status: DC | PRN

## 2023-02-13 MED ORDER — MIDAZOLAM HCL 2 MG/2ML IJ SOLN
2.0000 mg | Freq: Once | INTRAMUSCULAR | Status: AC
  Filled 2023-02-13: qty 2

## 2023-02-13 MED ORDER — ROPIVACAINE HCL 5 MG/ML IJ SOLN
INTRAMUSCULAR | Status: DC | PRN
  Administered 2023-02-13: 30 mL via PERINEURAL

## 2023-02-13 MED ORDER — CLONIDINE HCL (ANALGESIA) 100 MCG/ML EP SOLN
EPIDURAL | Status: DC | PRN
  Administered 2023-02-13: 50 ug

## 2023-02-13 MED ORDER — LIDOCAINE 2% (20 MG/ML) 5 ML SYRINGE
INTRAMUSCULAR | Status: AC
  Filled 2023-02-13: qty 5

## 2023-02-13 MED ORDER — ACETAMINOPHEN 500 MG PO TABS
ORAL_TABLET | ORAL | Status: AC
  Filled 2023-02-13: qty 2

## 2023-02-13 MED ORDER — ORAL CARE MOUTH RINSE
15.0000 mL | Freq: Once | OROMUCOSAL | Status: AC
  Administered 2023-02-13: 15 mL via OROMUCOSAL

## 2023-02-13 MED ORDER — ONDANSETRON HCL 4 MG/2ML IJ SOLN
INTRAMUSCULAR | Status: AC
  Filled 2023-02-13: qty 2

## 2023-02-13 MED ORDER — FENTANYL CITRATE (PF) 100 MCG/2ML IJ SOLN
INTRAMUSCULAR | Status: AC
  Filled 2023-02-13: qty 2

## 2023-02-13 MED ORDER — MIDAZOLAM HCL 2 MG/2ML IJ SOLN
INTRAMUSCULAR | Status: AC
  Administered 2023-02-13: 2 mg via INTRAVENOUS
  Filled 2023-02-13: qty 2

## 2023-02-13 SURGICAL SUPPLY — 76 items
BAG COUNTER SPONGE SURGICOUNT (BAG) ×2 IMPLANT
BENZOIN TINCTURE PRP APPL 2/3 (GAUZE/BANDAGES/DRESSINGS) IMPLANT
BLADE CLIPPER SURG (BLADE) IMPLANT
BLADE MINI RND TIP GREEN BEAV (BLADE) IMPLANT
BLADE SURG 15 STRL LF DISP TIS (BLADE) ×4 IMPLANT
BNDG ELASTIC 2X5.8 VLCR STR LF (GAUZE/BANDAGES/DRESSINGS) IMPLANT
BNDG ELASTIC 3INX 5YD STR LF (GAUZE/BANDAGES/DRESSINGS) ×2 IMPLANT
BNDG ELASTIC 4INX 5YD STR LF (GAUZE/BANDAGES/DRESSINGS) IMPLANT
BNDG ELASTIC 4X5.8 VLCR STR LF (GAUZE/BANDAGES/DRESSINGS) IMPLANT
BNDG ESMARK 4X9 LF (GAUZE/BANDAGES/DRESSINGS) ×2 IMPLANT
BNDG GAUZE DERMACEA FLUFF 4 (GAUZE/BANDAGES/DRESSINGS) ×2 IMPLANT
CATH ROBINSON RED A/P 10FR (CATHETERS) IMPLANT
CHLORAPREP W/TINT 26 (MISCELLANEOUS) ×2 IMPLANT
CORD BIPOLAR FORCEPS 12FT (ELECTRODE) ×2 IMPLANT
COVER MAYO STAND STRL (DRAPES) ×2 IMPLANT
COVER SURGICAL LIGHT HANDLE (MISCELLANEOUS) ×2 IMPLANT
CUFF TOURN SGL QUICK 18X4 (TOURNIQUET CUFF) ×2 IMPLANT
CUFF TRNQT CYL 24X4X16.5-23 (TOURNIQUET CUFF) IMPLANT
DRAPE EXTREMITY T 121X128X90 (DISPOSABLE) ×2 IMPLANT
DRAPE OEC MINIVIEW 54X84 (DRAPES) ×2 IMPLANT
DRAPE SURG 17X23 STRL (DRAPES) ×2 IMPLANT
DRSG EMULSION OIL 3X3 NADH (GAUZE/BANDAGES/DRESSINGS) IMPLANT
DRSG XEROFORM 1X8 (GAUZE/BANDAGES/DRESSINGS) IMPLANT
GAUZE 4X4 16PLY ~~LOC~~+RFID DBL (SPONGE) IMPLANT
GAUZE PAD ABD 8X10 STRL (GAUZE/BANDAGES/DRESSINGS) IMPLANT
GAUZE SPONGE 4X4 12PLY STRL (GAUZE/BANDAGES/DRESSINGS) ×2 IMPLANT
GAUZE XEROFORM 1X8 LF (GAUZE/BANDAGES/DRESSINGS) ×2 IMPLANT
GLOVE BIO SURGEON STRL SZ7 (GLOVE) ×2 IMPLANT
GLOVE BIOGEL PI IND STRL 7.0 (GLOVE) ×2 IMPLANT
GLOVE BIOGEL PI IND STRL 8.5 (GLOVE) ×2 IMPLANT
GLOVE SURG ORTHO 8.0 STRL STRW (GLOVE) ×2 IMPLANT
GOWN STRL REUS W/ TWL LRG LVL3 (GOWN DISPOSABLE) ×4 IMPLANT
GOWN STRL REUS W/ TWL XL LVL3 (GOWN DISPOSABLE) ×2 IMPLANT
KIT BASIN OR (CUSTOM PROCEDURE TRAY) ×2 IMPLANT
KIT TURNOVER KIT B (KITS) ×2 IMPLANT
LOOP VASCLR MAXI BLUE 18IN ST (MISCELLANEOUS) IMPLANT
NDL HYPO 25X1 1.5 SAFETY (NEEDLE) IMPLANT
NDL KEITH (NEEDLE) IMPLANT
NEEDLE HYPO 25X1 1.5 SAFETY (NEEDLE)
NEEDLE KEITH (NEEDLE)
NS IRRIG 1000ML POUR BTL (IV SOLUTION) ×2 IMPLANT
PACK ORTHO EXTREMITY (CUSTOM PROCEDURE TRAY) ×2 IMPLANT
PAD ARMBOARD 7.5X6 YLW CONV (MISCELLANEOUS) ×4 IMPLANT
PAD CAST 3X4 CTTN HI CHSV (CAST SUPPLIES) ×2 IMPLANT
PAD CAST 4YDX4 CTTN HI CHSV (CAST SUPPLIES) IMPLANT
PADDING CAST ABS COTTON 3X4 (CAST SUPPLIES) IMPLANT
PADDING CAST ABS COTTON 4X4 ST (CAST SUPPLIES) ×2 IMPLANT
SLEEVE SCD COMPRESS KNEE MED (STOCKING) IMPLANT
SOAP 2 % CHG 4 OZ (WOUND CARE) ×2 IMPLANT
SPIKE FLUID TRANSFER (MISCELLANEOUS) IMPLANT
SPLINT FIBERGLASS 4X30 (CAST SUPPLIES) IMPLANT
STRIP CLOSURE SKIN 1/2X4 (GAUZE/BANDAGES/DRESSINGS) IMPLANT
SUT ETHIBOND 3-0 V-5 (SUTURE) IMPLANT
SUT ETHILON 3 0 PS 1 (SUTURE) IMPLANT
SUT ETHILON 4 0 P 3 18 (SUTURE) IMPLANT
SUT ETHILON 4 0 PS 2 18 (SUTURE) ×2 IMPLANT
SUT FIBERWIRE 3-0 18 TAPR NDL (SUTURE)
SUT FIBERWIRE 4-0 18 DIAM BLUE (SUTURE)
SUT MERSILENE 4 0 P 3 (SUTURE) IMPLANT
SUT MNCRL AB 4-0 PS2 18 (SUTURE) IMPLANT
SUT MON AB 5-0 PS2 18 (SUTURE) IMPLANT
SUT NYLON ETHILON 5-0 P-3 1X18 (SUTURE) IMPLANT
SUT PROLENE 4 0 P 3 18 (SUTURE) IMPLANT
SUT PROLENE 6 0 P 1 18 (SUTURE) IMPLANT
SUT SILK 2 0 PERMA HAND 18 BK (SUTURE) IMPLANT
SUT SUPRAMID 4-0 (SUTURE) IMPLANT
SUTURE FIBERWR 3-0 18 TAPR NDL (SUTURE) IMPLANT
SUTURE FIBERWR 4-0 18 DIA BLUE (SUTURE) IMPLANT
SYR CONTROL 10ML LL (SYRINGE) IMPLANT
SYS LIGAMENT RECONST IMPLANT S (Miscellaneous) ×2 IMPLANT
SYSTEM LIGAMENT RCNST IMPLNT S (Miscellaneous) IMPLANT
TOWEL GREEN STERILE (TOWEL DISPOSABLE) ×2 IMPLANT
TOWEL GREEN STERILE FF (TOWEL DISPOSABLE) ×4 IMPLANT
UNDERPAD 30X36 HEAVY ABSORB (UNDERPADS AND DIAPERS) ×2 IMPLANT
VASCULAR TIE MAXI BLUE 18IN ST (MISCELLANEOUS)
WATER STERILE IRR 1000ML POUR (IV SOLUTION) ×2 IMPLANT

## 2023-02-13 NOTE — Anesthesia Procedure Notes (Signed)
Anesthesia Regional Block: Supraclavicular block   Pre-Anesthetic Checklist: , timeout performed,  Correct Patient, Correct Site, Correct Laterality,  Correct Procedure, Correct Position, site marked,  Risks and benefits discussed,  Surgical consent,  Pre-op evaluation,  At surgeon's request and post-op pain management  Laterality: Left  Prep: chloraprep       Needles:  Injection technique: Single-shot  Needle Type: Echogenic Needle     Needle Length: 9cm  Needle Gauge: 21     Additional Needles:   Procedures:,,,, ultrasound used (permanent image in chart),,    Narrative:  Start time: 02/13/2023 11:20 AM End time: 02/13/2023 11:28 AM Injection made incrementally with aspirations every 5 mL.  Performed by: Personally  Anesthesiologist: Collene Schlichter, MD  Additional Notes: No pain on injection. No increased resistance to injection. Injection made in 5cc increments.  Good needle visualization.  Patient tolerated procedure well.

## 2023-02-13 NOTE — H&P (Signed)
HAND SURGERY   HPI: Patient is a 53 y.o. female who presents with left thumb carpometacarpal osteoarthritis that has failed extensive non surgical management..  Patient denies any changes to their medical history or new systemic symptoms today.    Past Medical History:  Diagnosis Date   Abnormal uterine bleeding    Anemia    Blood transfusion without reported diagnosis    Complication of anesthesia    can wake up during procedure   Diabetes mellitus without complication (HCC)    Dysmenorrhea    Endometriosis    Fibroid    Genital warts    GERD (gastroesophageal reflux disease)    Hypertension    PONV (postoperative nausea and vomiting)    RA (rheumatoid arthritis) (HCC)    Radiculopathy    Past Surgical History:  Procedure Laterality Date   ABDOMINAL HYSTERECTOMY     COLONOSCOPY WITH PROPOFOL N/A 06/26/2022   Procedure: COLONOSCOPY WITH PROPOFOL;  Surgeon: Wyline Mood, MD;  Location: [facility] ENDOSCOPY;  Service: Gastroenterology;  Laterality: N/A;  Patient informed the office that she wakes from anesthesia combative and pulliing at tubes.  She wanted [place] to be aware.   ESOPHAGOGASTRODUODENOSCOPY (EGD) WITH PROPOFOL N/A 06/26/2022   Procedure: ESOPHAGOGASTRODUODENOSCOPY (EGD) WITH PROPOFOL;  Surgeon: Wyline Mood, MD;  Location: [facility] ENDOSCOPY;  Service: Gastroenterology;  Laterality: N/A;   PARATHYROIDECTOMY     Social History   Socioeconomic History   Marital status: Married    Spouse name: Not on file   Number of children: Not on file   Years of education: Not on file   Highest education level: Not on file  Occupational History   Not on file  Tobacco Use   Smoking status: Former    Current packs/day: 0.00    Average packs/day: 1 pack/day for 10.0 years (10.0 ttl pk-yrs)    Types: Cigarettes    Start date: 01/15/1995    Quit date: 01/14/2005    Years since quitting: 18.0    Passive exposure: Past   Smokeless tobacco: Never  Vaping Use   Vaping status: Never Used   Substance and Sexual Activity   Alcohol use: Not Currently   Drug use: Never   Sexual activity: Yes    Partners: Male    Birth control/protection: Surgical    Comment: Hysterectomy; First IC @ 13y/o, Partners >5, DES exposure-unknown  Other Topics Concern   Not on file  Social History Narrative   Not on file   Social Drivers of Health   Financial Resource Strain: Low Risk  (05/07/2022)   Overall Financial Resource Strain (CARDIA)    Difficulty of Paying Living Expenses: Not hard at all  Food Insecurity: No Food Insecurity (05/07/2022)   Hunger Vital Sign    Worried About Running Out of Food in the Last Year: Never true    Ran Out of Food in the Last Year: Never true  Transportation Needs: No Transportation Needs (05/07/2022)   PRAPARE - Administrator, Civil Service (Medical): No    Lack of Transportation (Non-Medical): No  Physical Activity: Inactive (05/07/2022)   Exercise Vital Sign    Days of Exercise per Week: 0 days    Minutes of Exercise per Session: 0 min  Stress: No Stress Concern Present (05/07/2022)   [employer] of Occupational Health - Occupational Stress Questionnaire    Feeling of Stress : Not at all  Social Connections: Moderately Isolated (05/07/2022)   Social Connection and Isolation Panel [NHANES]    Frequency  of Communication with Friends and Family: More than three times a week    Frequency of Social Gatherings with Friends and Family: More than three times a week    Attends Religious Services: Never    Database administrator or Organizations: No    Attends Engineer, structural: Never    Marital Status: Married   Family History  Problem Relation Age of Onset   Obesity Mother    Coronary artery disease Mother    Bipolar disorder Mother    Multiple sclerosis Mother    Hypertension Father    Heart attack Father    Vascular Disease Father    Breast cancer Maternal Grandmother 73   - negative except otherwise stated in the  family history section Allergies  Allergen Reactions   Alcohol Anaphylaxis   Dilantin [Phenytoin] Anaphylaxis   Methotrexate     oral ulcers, nausea   Prior to Admission medications   Medication Sig Start Date End Date Taking? Authorizing Provider  acetaminophen (TYLENOL) 325 MG tablet Take 975 mg by mouth every 6 (six) hours as needed for moderate pain (pain score 4-6).   Yes [provider]  amLODipine (NORVASC) 10 MG tablet TAKE 1 TABLET BY MOUTH DAILY 06/11/22  Yes Boscia, Heather E, NP  atorvastatin (LIPITOR) 10 MG tablet Take 1 tablet (10 mg total) by mouth daily. 09/20/22  Yes Sandre Kitty, MD  clobetasol cream (TEMOVATE) 0.05 % Apply 1 application. topically 2 (two) times daily. Patient taking differently: Apply 1 application  topically 2 (two) times daily as needed (rash). 05/22/21  Yes Boscia, Heather E, NP  Cranberry 1000 MG CAPS Take 1,000 mg by mouth daily. 12/14/20  Yes [provider]  Cyanocobalamin (B-12 PO) Take 1 capsule by mouth daily.   Yes [provider]  esomeprazole (NEXIUM) 40 MG capsule Take 1 capsule (40 mg total) by mouth daily. 09/26/22  Yes Wyline Mood, MD  estradiol (VIVELLE-DOT) 0.0375 MG/24HR Place 1 patch onto the skin 2 (two) times a week. 11/14/22  Yes Amundson Shirley Friar, MD  fenofibrate (TRICOR) 145 MG tablet TAKE 1 TABLET BY MOUTH EVERY DAY WITH DINNER 11/26/22  Yes Sandre Kitty, MD  furosemide (LASIX) 20 MG tablet Take 20 mg by mouth daily as needed for edema.   Yes [provider]  HYDROcodone-acetaminophen (NORCO/VICODIN) 5-325 MG tablet Take 1 tablet by mouth every 8 (eight) hours as needed. 07/19/21  Yes [provider]  hydroxychloroquine (PLAQUENIL) 200 MG tablet Take 200 mg by mouth 2 (two) times daily.   Yes [provider]  losartan (COZAAR) 100 MG tablet TAKE 1 TABLET(100 MG) BY MOUTH DAILY 11/12/22  Yes Sandre Kitty, MD  metoprolol succinate (TOPROL-XL) 25 MG 24 hr tablet Take 1  tablet (25 mg total) by mouth daily. Patient taking differently: Take 12.5 mg by mouth every evening. 07/19/22  Yes Saralyn Pilar A, PA  nystatin cream (MYCOSTATIN) Apply 1 Application topically 2 (two) times daily. Patient taking differently: Apply 1 Application topically 2 (two) times daily as needed (rash). 12/11/22  Yes Waldon Merl, PA-C  pregabalin (LYRICA) 225 MG capsule Take 225 mg by mouth 3 (three) times daily. 01/24/22  Yes [provider]  progesterone (PROMETRIUM) 100 MG capsule Take 1 capsule (100 mg total) by mouth daily. 10/10/22  Yes Amundson Shirley Friar, MD  tiZANidine (ZANAFLEX) 2 MG tablet Take 2 mg by mouth every 6 (six) hours as needed for muscle spasms.  Yes [provider]  Tocilizumab (ACTEMRA IV) Inject 1 Dose into the vein every 28 (twenty-eight) days.   Yes [provider]  Blood Pressure Monitor DEVI 1 Device by Does not apply route daily. 09/06/22   Sandre Kitty, MD  Lidocaine (BLUE-EMU PAIN RELIEF DRY EX) Apply 1 Application topically daily as needed (pain).    [provider]   No results found. - Positive ROS: All other systems have been reviewed and were otherwise negative with the exception of those mentioned in the HPI and as above.  Physical Exam: General: No acute distress, resting comfortably Cardiovascular: BUE warm and well perfused, normal rate Respiratory: Normal WOB on RA Skin: Warm and dry Neurologic: Sensation intact distally Psychiatric: Patient is at baseline mood and affect  Left Upper Extremity  Mild swelling at the base of her thumb.  Pain and crepitus with CMC grind test.  Minimal (<10 deg) static/dynamic MCP hyperextension.  No pain or crepitus with CMC grind test.  Sensation limited by peripheral nerve block.  Her hand is warm and well perfused w/ BCR.     Assessment: 53 yo F w/ left thumb CMC osteoarthritis that has failed extensive nonsurgical management.   Plan: OR today for Left  trapeziectomy with LRTI. We again reviewed the risks of surgery which include bleeding, infection, damage to neurovascular structures, persistent symptoms, thumb stiffnesss, delayed wound healing, need for additional surgery.  Informed consent was signed.  All questions were answered.   Marlyne Beards, M.D. [facility] 12:14 PM

## 2023-02-13 NOTE — Interval H&P Note (Signed)
History and Physical Interval Note:  02/13/2023 12:16 PM  Elizabeth Patrick  has presented today for surgery, with the diagnosis of Left thumb carpalmetacarpal osteoarthritis.  The various methods of treatment have been discussed with the patient and family. After consideration of risks, benefits and other options for treatment, the patient has consented to  Procedure(s) with comments: Left trapeziectomy with ligament reconstruction and tendon interposition (Left) - regional, he can start earlier if need be to fit possible percutaneous pinning of thumb MCP join (Left) - regional he can start earlier if need be to fit as a surgical intervention.  The patient's history has been reviewed, patient examined, no change in status, stable for surgery.  I have reviewed the patient's chart and labs.  Questions were answered to the patient's satisfaction.     Bernise Sylvain

## 2023-02-13 NOTE — Transfer of Care (Signed)
Immediate Anesthesia Transfer of Care Note  Patient: Elizabeth Patrick  Procedure(s) Performed: Left trapeziectomy with ligament reconstruction and tendon interposition (Left) possible percutaneous pinning of thumb MCP join (Left: Finger)  Patient Location: PACU  Anesthesia Type:General and Regional  Level of Consciousness: awake, drowsy, patient cooperative, and responds to stimulation  Airway & Oxygen Therapy: Patient Spontanous Breathing and Patient connected to nasal cannula oxygen  Post-op Assessment: Report given to RN and Post -op Vital signs reviewed and stable  Post vital signs: Reviewed and stable  Last Vitals:  Vitals Value Taken Time  BP 132/75 02/13/23 1536  Temp    Pulse 50 02/13/23 1537  Resp 16 02/13/23 1537  SpO2 92 % 02/13/23 1537  Vitals shown include unfiled device data.  Last Pain:  Vitals:   02/13/23 1034  TempSrc:   PainSc: 5          Complications: No notable events documented.

## 2023-02-13 NOTE — Anesthesia Preprocedure Evaluation (Addendum)
Anesthesia Evaluation  Patient identified by MRN, date of birth, ID band Patient awake    Reviewed: Allergy & Precautions, NPO status , Patient's Chart, lab work & pertinent test results, reviewed documented beta blocker date and time   History of Anesthesia Complications (+) PONV and history of anesthetic complications  Airway Mallampati: III  TM Distance: >3 FB Neck ROM: Full    Dental  (+) Teeth Intact, Dental Advisory Given, Missing   Pulmonary sleep apnea , former smoker   Pulmonary exam normal breath sounds clear to auscultation       Cardiovascular hypertension, Pt. on medications and Pt. on home beta blockers (-) angina (-) Past MI Normal cardiovascular exam Rhythm:Regular Rate:Normal     Neuro/Psych  PSYCHIATRIC DISORDERS  Depression     Neuromuscular disease    GI/Hepatic Neg liver ROS,GERD  Medicated and Controlled,,  Endo/Other  diabetes, Well Controlled, Type 2  Class 4 obesity  Renal/GU negative Renal ROS     Musculoskeletal  (+) Arthritis , Rheumatoid disorders,  Left thumb carpalmetacarpal osteoarthritis   Abdominal   Peds  Hematology negative hematology ROS (+)   Anesthesia Other Findings Day of surgery medications reviewed with the patient.  Reproductive/Obstetrics                             Anesthesia Physical Anesthesia Plan  ASA: 4  Anesthesia Plan: General   Post-op Pain Management: Regional block* and Tylenol PO (pre-op)*   Induction: Intravenous  PONV Risk Score and Plan: 4 or greater and Midazolam, Dexamethasone and Ondansetron  Airway Management Planned: LMA  Additional Equipment:   Intra-op Plan:   Post-operative Plan: Extubation in OR  Informed Consent: I have reviewed the patients History and Physical, chart, labs and discussed the procedure including the risks, benefits and alternatives for the proposed anesthesia with the patient or authorized  representative who has indicated his/her understanding and acceptance.     Dental advisory given  Plan Discussed with: CRNA and Anesthesiologist  Anesthesia Plan Comments:        Anesthesia Quick Evaluation

## 2023-02-13 NOTE — Discharge Instructions (Signed)
Waylan Rocher, M.D. Hand Surgery  POST-OPERATIVE DISCHARGE INSTRUCTIONS   PRESCRIPTIONS: You may have been given a prescription to be taken as directed for post-operative pain control.  You may also take over the counter ibuprofen/aleve and tylenol for pain. Take this as directed on the packaging. Do not exceed 3000 mg tylenol/acetaminophen in 24 hours.  Ibuprofen 600-800 mg (3-4) tablets by mouth every 6 hours as needed for pain.  OR Aleve 2 tablets by mouth every 12 hours (twice daily) as needed for pain.  AND/OR Tylenol 1000 mg (2 tablets) every 8 hours as needed for pain.  Please use your pain medication carefully, as refills are limited and you may not be provided with one.  As stated above, please use over the counter pain medicine - it will also be helpful with decreasing your swelling.    ANESTHESIA: After your surgery, post-surgical discomfort or pain is likely. This discomfort can last several days to a few weeks. At certain times of the day your discomfort may be more intense.   Did you receive a nerve block?  A nerve block can provide pain relief for one hour to two days after your surgery. As long as the nerve block is working, you will experience little or no sensation in the area the surgeon operated on.  As the nerve block wears off, you will begin to experience pain or discomfort. It is very important that you begin taking your prescribed pain medication before the nerve block fully wears off. Treating your pain at the first sign of the block wearing off will ensure your pain is better controlled and more tolerable when full-sensation returns. Do not wait until the pain is intolerable, as the medicine will be less effective. It is better to treat pain in advance than to try and catch up.   General Anesthesia:  If you did not receive a nerve block during your surgery, you will need to start taking your pain medication shortly after your surgery and should continue  to do so as prescribed by your surgeon.     ICE AND ELEVATION: You may use ice for the first 48-72 hours, but it is not critical.   Motion of your fingers is very important to decrease the swelling.  Elevation, as much as possible for the next 48 hours, is critical for decreasing swelling as well as for pain relief. Elevation means when you are seated or lying down, you hand should be at or above your heart. When walking, the hand needs to be at or above the level of your elbow.  If the bandage gets too tight, it may need to be loosened. Please contact our office and we will instruct you in how to do this.    SURGICAL BANDAGES:  Keep your dressing and/or splint clean and dry at all times.  Do not remove until you are seen again in the office.  If careful, you may place a plastic bag over your bandage and tape the end to shower, but be careful, do not get your bandages wet.     HAND THERAPY:  You will be contacted to set up your first therapy visit   ACTIVITY AND WORK: You are encouraged to move any fingers which are not in the bandage.  Light use of the fingers is allowed to assist the other hand with daily hygiene and eating, but strong gripping or lifting is often uncomfortable and should be avoided.  You might miss a variable period  of time from work and hopefully this issue has been discussed prior to surgery. You may not do any heavy work with your affected hand for about 2 weeks.    [facility] Second Floor, [address] [place], [place] [address] [phone]

## 2023-02-13 NOTE — Progress Notes (Signed)
Pt's BP 84/47 HR 48. Dr. Desmond Lope is aware. Per Dr Desmond Lope to gibe a bolus of 500 ml and let him know if HR is 45 and below.  BP re-checked - 103/58 HR 52 at 1211pm

## 2023-02-13 NOTE — Anesthesia Postprocedure Evaluation (Signed)
Anesthesia Post Note  Patient: Hospital doctor J Tarnowski  Procedure(s) Performed: Left trapeziectomy with ligament reconstruction and tendon interposition (Left) possible percutaneous pinning of thumb MCP join (Left: Finger)     Patient location during evaluation: PACU Anesthesia Type: General Level of consciousness: awake and alert Pain management: pain level controlled Vital Signs Assessment: post-procedure vital signs reviewed and stable Respiratory status: spontaneous breathing, nonlabored ventilation, respiratory function stable and patient connected to nasal cannula oxygen Cardiovascular status: blood pressure returned to baseline and stable Postop Assessment: no apparent nausea or vomiting Anesthetic complications: no   No notable events documented.  Last Vitals:  Vitals:   02/13/23 1615 02/13/23 1630  BP: 122/69 122/72  Pulse: (!) 49 (!) 51  Resp: 15 15  Temp:    SpO2: 90% 91%    Last Pain:  Vitals:   02/13/23 1630  TempSrc:   PainSc: 0-No pain                 Collene Schlichter

## 2023-02-13 NOTE — Op Note (Signed)
Date of Surgery: 02/13/2023  INDICATIONS: Patient is a 53 y.o.-year-old female with left thumb CMC osteoarthrits that has failed extensive nonsurgical management.  Risks, benefits, and alternatives to surgery were again discussed with the patient in the preoperative area. The patient wishes to proceed with surgery.  Informed consent was signed after our discussion.   PREOPERATIVE DIAGNOSIS:  Left thumb CMC osteoarthritis  POSTOPERATIVE DIAGNOSIS: Same.  PROCEDURE:  Left trapeziectomy Left ligament reconstruction with FCR tendon transfer Left FCR tendon interposition arthroplasty   SURGEON: Waylan Rocher, M.D.  ASSIST: None  ANESTHESIA:  Regional, MAC  IV FLUIDS AND URINE: See anesthesia.  ESTIMATED BLOOD LOSS: 10 mL.  IMPLANTS:  Implant Name Type Inv. Item Serial No. Manufacturer Lot No. LRB No. Used Action  SYS LIGAMENT RECONST IMPLANT S - [id number] Miscellaneous SYS LIGAMENT RECONST IMPLANT S  [employer] [id number] Left 1 Implanted     DRAINS: None  COMPLICATIONS: None  DESCRIPTION OF PROCEDURE: The patient was met in the preoperative holding area where the surgical site was marked and the consent form was signed.  The patient was then taken to the operating room and transferred to the operating table.  All bony prominences were well padded.  A tourniquet was applied to the left upper arm.  General endotracheal anesthesia was induced.  The operative extremity was prepped and draped in the usual and sterile fashion.  A formal time-out was performed to confirm that this was the correct patient, surgery, side, and site.   Following formal timeout, the limb was gently exsanguinated with an esmarch bandage and the tourniquet inflated to 250 mmHg.  A longitudinal incision was made over the dorsal aspect of the thumb from just distal to the [facility] joint to just distal to the radial styloid.  The skin was incised.  Blunt dissection was used with a tenotomy scissor to identify  branches of the superficial radial nerve.  These were protected throughout the surgery.  Small superficial veins were coagulated with bipolar cautery as needed.  The APL and EPB tendons were identified.  The fascia overlying the tendons was divided using a tenotomy scissor.  Blunt dissection was used to identify the branch of the radial artery overlying the trapezium.  The artery was dissected free of adjacent soft tissue as was the accompanying vein.  Small branches were coagulated to allow for mobilization of the vessels.  A Weitlaner retractor was placed in the wound retracting the radial artery and accompanying vein away from the overlying joint capsule.  A 15 blade was used to perform a capsulotomy over the dorsal aspect of the metacarpal and along the dorsal aspect of the trapezium.  The capsuloligamentous structures were then circumferentially divided from their insertion on the trapezium.  A McGlamry elevator was then used to divide the remaining capsular ligamentous structures taking care to protect the underlying flexor carpi radialis tendon.  The trapezium was removed en bloc.  There was significant eburnation of the articular surface.  There was similar eburnation of the base of the metacarpal.  A Freer elevator was used to inspect the articulation between the trapezoid and the scaphoid.  There were mild degenerative changes present.  The FCR tendon was then inspected and was found to be intact.  A tenotomy scissor was used to free up the tendon all the way to its insertion.  I then turned my attention to the volar aspect of the forearm.  A transverse incision was made over the wrist flexion crease directly of the  FCR tendon.  A second transverse incision was made 10 cm proximal.  The flexor carpi radialis tendon was identified and dissected free of the surrounding soft tissues.  Great care was taken to ensure that this was in fact the FCR tendon.  Tendon was transected proximally.   The tendon was then  pulled into the trapeziectomy space.  The guidewire was then driven from the dorsal and central aspect of the metacarpal base approximately 1 cm distal to the articular surface to the volar margin of the articular surface.  This was then overdrilled with a 4 mm cannulated drill.  The tendon was then passed through this tunnel using the QuickPass tendon shuttle device.  The thumb was then held in appropriate position and a 4 mm interference screw was advanced until it was flush with the cortex.  This provided appropriate suspension of the thumb metacarpal.  A 3-0 Ethibond suture was then placed within the trapeziectomy space through the volar capsule.  The remaining flexor carpi radialis tendon was folded upon itself and sutured into place to serve as interposition material.  The wound was then thoroughly irrigated with copious sterile saline.  The capsule was closed using a 4-0 Mersilene suture.  The tourniquet was deflated.  Hemostasis was achieved with direct pressure over the wound.  The skin was then closed in a layered fashion using a 3-0 Monocryl suture followed by a 4-0 nylon suture.  The two incisions in the forearm were closed in a similar manner using a 4-0 nylon suture in horizontal mattress fashion.    The wounds were then cleaned and dressed with Xeroform, folded Kerlix, cast padding, and a well-padded thumb spica splint was applied.   The patient was reversed from sedation.  All counts were correct x 2 at the end of the procedure.  The patient was then taken to the PACU in stable condition.     POSTOPERATIVE PLAN: She will be discharged to home with appropriate pain medication and discharge instructions.  A referral will be placed to hand therapy.  I'll see her back in 10-14 days for her first postop visit.   Waylan Rocher, MD 3:31 PM

## 2023-02-13 NOTE — Anesthesia Procedure Notes (Signed)
Procedure Name: LMA Insertion Date/Time: 02/13/2023 1:49 PM  Performed by: Bartholomew Crews, CRNAPre-anesthesia Checklist: Patient identified, Emergency Drugs available, Suction available and Patient being monitored Patient Re-evaluated:Patient Re-evaluated prior to induction Oxygen Delivery Method: Circle system utilized Preoxygenation: Pre-oxygenation with 100% oxygen Induction Type: IV induction LMA: LMA inserted LMA Size: 4.0 Number of attempts: 1 Dental Injury: Teeth and Oropharynx as per pre-operative assessment

## 2023-02-14 ENCOUNTER — Encounter (HOSPITAL_COMMUNITY): Payer: Self-pay | Admitting: Orthopedic Surgery

## 2023-02-15 DIAGNOSIS — E109 Type 1 diabetes mellitus without complications: Secondary | ICD-10-CM | POA: Diagnosis not present

## 2023-02-20 ENCOUNTER — Ambulatory Visit
Admission: RE | Admit: 2023-02-20 | Discharge: 2023-02-20 | Disposition: A | Discharge status: Home / Self Care | Payer: Medicare Other | Source: Ambulatory Visit | Attending: Obstetrics and Gynecology | Admitting: Obstetrics and Gynecology

## 2023-02-20 ENCOUNTER — Encounter: Payer: Self-pay | Admitting: Obstetrics and Gynecology

## 2023-02-20 DIAGNOSIS — N958 Other specified menopausal and perimenopausal disorders: Secondary | ICD-10-CM | POA: Diagnosis not present

## 2023-02-20 DIAGNOSIS — Z92241 Personal history of systemic steroid therapy: Secondary | ICD-10-CM

## 2023-02-20 DIAGNOSIS — M069 Rheumatoid arthritis, unspecified: Secondary | ICD-10-CM | POA: Diagnosis not present

## 2023-02-20 DIAGNOSIS — E2839 Other primary ovarian failure: Secondary | ICD-10-CM | POA: Diagnosis not present

## 2023-02-20 DIAGNOSIS — Z13820 Encounter for screening for osteoporosis: Secondary | ICD-10-CM

## 2023-02-21 DIAGNOSIS — M79645 Pain in left finger(s): Secondary | ICD-10-CM | POA: Diagnosis not present

## 2023-02-25 DIAGNOSIS — M0609 Rheumatoid arthritis without rheumatoid factor, multiple sites: Secondary | ICD-10-CM | POA: Diagnosis not present

## 2023-02-25 DIAGNOSIS — Z79899 Other long term (current) drug therapy: Secondary | ICD-10-CM | POA: Diagnosis not present

## 2023-02-25 DIAGNOSIS — M1812 Unilateral primary osteoarthritis of first carpometacarpal joint, left hand: Secondary | ICD-10-CM | POA: Diagnosis not present

## 2023-02-26 ENCOUNTER — Other Ambulatory Visit: Payer: Self-pay | Admitting: Family Medicine

## 2023-02-26 ENCOUNTER — Encounter: Payer: Self-pay | Admitting: Rheumatology

## 2023-02-26 DIAGNOSIS — I10 Essential (primary) hypertension: Secondary | ICD-10-CM

## 2023-02-26 LAB — LAB REPORT - SCANNED: EGFR: 84

## 2023-02-26 MED ORDER — AMLODIPINE BESYLATE 10 MG PO TABS: ORAL_TABLET | ORAL | 3 refills | Status: AC

## 2023-03-04 DIAGNOSIS — M5412 Radiculopathy, cervical region: Secondary | ICD-10-CM | POA: Diagnosis not present

## 2023-03-05 ENCOUNTER — Encounter: Payer: Self-pay | Admitting: Family Medicine

## 2023-03-06 ENCOUNTER — Other Ambulatory Visit: Payer: Self-pay | Admitting: Family Medicine

## 2023-03-06 DIAGNOSIS — M79645 Pain in left finger(s): Secondary | ICD-10-CM | POA: Diagnosis not present

## 2023-03-06 MED ORDER — SULFAMETHOXAZOLE-TRIMETHOPRIM 800-160 MG PO TABS: 1.0000 | ORAL_TABLET | Freq: Two times a day (BID) | ORAL | 0 refills | Status: DC

## 2023-03-07 ENCOUNTER — Telehealth: Payer: Self-pay

## 2023-03-07 ENCOUNTER — Other Ambulatory Visit: Payer: Self-pay | Admitting: Family Medicine

## 2023-03-07 MED ORDER — SULFAMETHOXAZOLE-TRIMETHOPRIM 800-160 MG PO TABS
1.0000 | ORAL_TABLET | Freq: Two times a day (BID) | ORAL | 0 refills | Status: AC
Start: 2023-03-07 — End: 2023-03-10

## 2023-03-07 NOTE — Telephone Encounter (Signed)
Copied from CRM [id number]. Topic: Clinical - Prescription Issue >> Mar 07, 2023 10:18 AM Antony Haste wrote: Reason for CRM: PT states she accidentally dropped her sulfamethoxazole-trimethoprim (BACTRIM DS) 800-160 MG tablet in the toilet this morning, she is needing this re-sent to the pharmacy to aid her UTI symptoms. She is requesting to receive confirmation once it has been sent over to her pharmacy again, through MyChart.

## 2023-03-07 NOTE — Telephone Encounter (Signed)
 Pt is advised.

## 2023-03-07 NOTE — Telephone Encounter (Signed)
Please let her know I have re sent the bactrim prescription

## 2023-03-13 DIAGNOSIS — M79645 Pain in left finger(s): Secondary | ICD-10-CM | POA: Diagnosis not present

## 2023-03-15 DIAGNOSIS — E109 Type 1 diabetes mellitus without complications: Secondary | ICD-10-CM | POA: Diagnosis not present

## 2023-03-17 ENCOUNTER — Ambulatory Visit (INDEPENDENT_AMBULATORY_CARE_PROVIDER_SITE_OTHER): Payer: Medicare Other | Admitting: Family Medicine

## 2023-03-17 ENCOUNTER — Encounter: Payer: Self-pay | Admitting: Family Medicine

## 2023-03-17 VITALS — BP 114/65 | HR 62 | Ht 64.0 in | Wt 356.0 lb

## 2023-03-17 DIAGNOSIS — I10 Essential (primary) hypertension: Secondary | ICD-10-CM

## 2023-03-17 DIAGNOSIS — G629 Polyneuropathy, unspecified: Secondary | ICD-10-CM

## 2023-03-17 DIAGNOSIS — E119 Type 2 diabetes mellitus without complications: Secondary | ICD-10-CM | POA: Diagnosis not present

## 2023-03-17 DIAGNOSIS — R600 Localized edema: Secondary | ICD-10-CM

## 2023-03-17 DIAGNOSIS — E669 Obesity, unspecified: Secondary | ICD-10-CM

## 2023-03-17 DIAGNOSIS — I152 Hypertension secondary to endocrine disorders: Secondary | ICD-10-CM

## 2023-03-17 DIAGNOSIS — M069 Rheumatoid arthritis, unspecified: Secondary | ICD-10-CM

## 2023-03-17 DIAGNOSIS — E1159 Type 2 diabetes mellitus with other circulatory complications: Secondary | ICD-10-CM

## 2023-03-17 DIAGNOSIS — Z794 Long term (current) use of insulin: Secondary | ICD-10-CM

## 2023-03-17 MED ORDER — LOSARTAN POTASSIUM 100 MG PO TABS: 50.0000 mg | ORAL_TABLET | Freq: Every day | ORAL | Status: DC

## 2023-03-17 MED ORDER — HYDROCHLOROTHIAZIDE 12.5 MG PO TABS: 12.5000 mg | ORAL_TABLET | Freq: Every day | ORAL | 3 refills | Status: DC

## 2023-03-17 NOTE — Assessment & Plan Note (Signed)
 Patient not currently on any diabetic medications.  A1c 5.3.  Weight did increase with Ozempic cessation by 20 pounds.  There is a note from 2022 that states patient had an A1c of 6.5 when she was newly diagnosed.  Patient agreeable to restarting Ozempic to help with weight management.  Will start back on 0.25.  She already has pens of these at home so does not need a refill at this time.

## 2023-03-17 NOTE — Patient Instructions (Addendum)
 It was nice to see you today,  We addressed the following topics today: -I am going to prescribe a medication called hydrochlorothiazide.  This is also a medication that lowers your okay blood pressure in addition to being a diuretic.  Therefore I would like you to lower your losartan dose to 50 mg while you add this. - Do not take your furosemide when you take the hydrochlorothiazide.  These medications both do the same thing and can lead to low potassium levels when you take them both together. - You should call your insurance company to see if they will send you a blood pressure cuff.  If not, I can send in a prescription for 1. - We will go back on your Ozempic.  Start at 0.25 and increase to 0.5 if you can. - To help with the constipation I would recommend taking over-the-counter MiraLAX using 1 capful per 4 ounces of water daily.  If you need to you can increase this to twice a day with the goal of having a soft bowel movement every 1 to 2 days. - Talk to your urologist to see if there is any other medication options he would recommend urinary frequency   Have a great day,  Frederic Jericho, MD

## 2023-03-17 NOTE — Progress Notes (Signed)
 Established Patient Office Visit  Subjective   [name], female    DOB: 1970/11/28  Age: 53 y.o. MRN: [id number]  Chief Complaint  Patient presents with   Medical Management of Chronic Issues    HPI  Obesity/prediabetes-patient has been off of her GLP-1.  Blood sugar remains controlled, but weight has increased.  Has gained roughly 20 pounds back since stopping.  We discussed restarting and she is agreeable to this.  Side effects included gastroparesis, constipation.  Discussed ways to manage the constipation.  Patient has received her rolling walker with a seat, but it is too wide to fit through her doorways due to being a bariatric walker.  Leg swelling-patient complains of worsening leg swelling.  It is painful.  She cannot wear compression sleeves due to neuropathy.  She takes Lasix as needed but does not feel like it makes her urinate any more frequently than when she does not take it.  Has issues with frequent nocturia ongoing for several years.  Hypertension-patient taking losartan 100 mg and metoprolol 5 mg and amlodipine.  Takes metoprolol and amlodipine at night and will start in the morning.  Was on hydrochlorothiazide in the past briefly but was taken off of this when trying to minimize her medications.  She has episodes of low blood pressure into the low 100s or 90s occasionally.  Not symptomatic.   The 10-year ASCVD risk score (Arnett DK, et al., 2019) is: 2.1%  Health Maintenance Due  Topic Date Due   DTaP/Tdap/Td (1 - Tdap) Never done   Zoster Vaccines- Shingrix (1 of 2) Never done   Pneumococcal Vaccine 63-15 Years old (2 of 2 - PPSV23 or PCV20) 06/23/2014   COVID-19 Vaccine (4 - 2024-25 season) 09/15/2022   Medicare Annual Wellness (AWV)  05/07/2023      Objective:     BP 114/65   Pulse 62   Ht 5\' 4"  (1.626 m)   Wt (!) 356 lb (161.5 kg)   SpO2 97%   BMI 61.11 kg/m    Physical Exam General: Alert and oriented Pulmonary: No  respiratory distress Tremors: Nonpitting edema in the lower extremities bilaterally.   No results found for any visits on 03/17/23.      Assessment & Plan:   Type 2 diabetes mellitus without complication, with long-term current use of insulin (HCC) Assessment & Plan: Patient not currently on any diabetic medications.  A1c 5.3.  Weight did increase with Ozempic cessation by 20 pounds.  There is a note from 2022 that states patient had an A1c of 6.5 when she was newly diagnosed.  Patient agreeable to restarting Ozempic to help with weight management.  Will start back on 0.25.  She already has pens of these at home so does not need a refill at this time.   Neuropathy -     Losartan Potassium; Take 0.5 tablets (50 mg total) by mouth daily. TAKE 1 TABLET(100 MG) BY MOUTH DAILY  Primary hypertension -     Losartan Potassium; Take 0.5 tablets (50 mg total) by mouth daily. TAKE 1 TABLET(100 MG) BY MOUTH DAILY  Obesity (BMI 35.0-39.9 without comorbidity) -     POCT glycosylated hemoglobin (Hb A1C) -     Losartan Potassium; Take 0.5 tablets (50 mg total) by mouth daily. TAKE 1 TABLET(100 MG) BY MOUTH DAILY  Bilateral lower extremity edema Assessment & Plan: Patient noticed a worsening of her lower extremity swelling since our last visit.  Says it is more  painful.  Has been taking furosemide as needed but does not feel like it is helping or that it increases her urination.  We discussed a thiazide instead.  Will prescribe hydrochlorothiazide 12.5.  Advised her to drop her losartan down from 100 to 50 because of episodes of fluctuating blood pressure.  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  Likely explained by her weight gain since stopping Ozempic.  Does not tolerate compression socks due to neuropathy.   Hypertension associated with diabetes [facility]) Assessment & Plan: Will add hydrochlorothiazide to help with swelling and will decrease  losartan from 100-50 since she has had fluctuating blood pressures with some in the low 100s.  Today initial blood pressure was 144 systolic.   Rheumatoid arthritis, involving unspecified site, unspecified whether rheumatoid factor present [facility]) Assessment & Plan: Patient received her rolling walker but it was a bariatric walker and does not fit in between her doorways.  Patient trying to get it changed out for a regular sized walker.   Other orders -     hydroCHLOROthiazide; Take 1 tablet (12.5 mg total) by mouth daily.  Dispense: 90 tablet; Refill: 3   I spent 32 minutes in the management this patient.  Return weight, leg swelling.    Sandre Kitty, MD

## 2023-03-17 NOTE — Assessment & Plan Note (Signed)
 Patient received her rolling walker but it was a bariatric walker and does not fit in between her doorways.  Patient trying to get it changed out for a regular sized walker.

## 2023-03-17 NOTE — Assessment & Plan Note (Signed)
 Will add hydrochlorothiazide to help with swelling and will decrease losartan from 100-50 since she has had fluctuating blood pressures with some in the low 100s.  Today initial blood pressure was 144 systolic.

## 2023-03-17 NOTE — Assessment & Plan Note (Signed)
 Patient noticed a worsening of her lower extremity swelling since our last visit.  Says it is more painful.  Has been taking furosemide as needed but does not feel like it is helping or that it increases her urination.  We discussed a thiazide instead.  Will prescribe hydrochlorothiazide 12.5.  Advised her to drop her losartan down from 100 to 50 because of episodes of fluctuating blood pressure.  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  Likely explained by her weight gain since stopping Ozempic.  Does not tolerate compression socks due to neuropathy.

## 2023-03-25 DIAGNOSIS — M0609 Rheumatoid arthritis without rheumatoid factor, multiple sites: Secondary | ICD-10-CM | POA: Diagnosis not present

## 2023-03-27 DIAGNOSIS — M79645 Pain in left finger(s): Secondary | ICD-10-CM | POA: Diagnosis not present

## 2023-03-28 DIAGNOSIS — Z79891 Long term (current) use of opiate analgesic: Secondary | ICD-10-CM | POA: Diagnosis not present

## 2023-03-28 DIAGNOSIS — M170 Bilateral primary osteoarthritis of knee: Secondary | ICD-10-CM | POA: Diagnosis not present

## 2023-04-03 DIAGNOSIS — M79645 Pain in left finger(s): Secondary | ICD-10-CM | POA: Diagnosis not present

## 2023-04-07 NOTE — Progress Notes (Unsigned)
 Office Visit Note  Patient: Elizabeth Patrick             Date of Birth: 11-03-1970           MRN: [id number]             PCP: Sandre Kitty, MD Referring: Sandre Kitty, MD Visit Date: 04/08/2023 Occupation: @GUAROCC @  Subjective:  No chief complaint on file.   History of Present Illness: Elizabeth Patrick is a 53 y.o. female ***     Activities of Daily Living:  Patient reports morning stiffness for *** {minute/hour:19697}.   Patient {ACTIONS;DENIES/REPORTS:21021675::"Denies"} nocturnal pain.  Difficulty dressing/grooming: {ACTIONS;DENIES/REPORTS:21021675::"Denies"} Difficulty climbing stairs: {ACTIONS;DENIES/REPORTS:21021675::"Denies"} Difficulty getting out of chair: {ACTIONS;DENIES/REPORTS:21021675::"Denies"} Difficulty using hands for taps, buttons, cutlery, and/or writing: {ACTIONS;DENIES/REPORTS:21021675::"Denies"}  No Rheumatology ROS completed.   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   Vitamin B12 deficiency 07/23/2021   Severe sleep apnea 07/04/2021   Psoriasis and similar disorder 05/27/2021   Depression, major, single episode, moderate (HCC) 04/29/2021   Loud snoring 04/17/2021   Menopause 04/16/2021   Morbid obesity (HCC) 04/05/2021   Osteoarthritis of both knees 04/04/2021   Type 2 diabetes mellitus without complication, with long-term current use of insulin (HCC) 11/28/2020   Gastroesophageal reflux disease 11/28/2020   Recurrent UTI 10/17/2020   Hyperlipidemia associated with type 2 diabetes mellitus (HCC) 10/11/2020   Bilateral lower extremity edema 10/11/2020   Diabetic polyneuropathy associated with type 2 diabetes mellitus (HCC) 10/11/2020   NASH (nonalcoholic steatohepatitis) 10/10/2020   Rheumatoid  arthritis (HCC) 09/26/2020   Hypertension associated with diabetes (HCC) 09/26/2020   Osteoarthritis 09/26/2020    Past Medical History:  Diagnosis Date   Abnormal uterine bleeding    Anemia    Blood transfusion without reported diagnosis    Complication of anesthesia    can wake up during procedure   Diabetes mellitus without complication (HCC)    Dysmenorrhea    Endometriosis    Fibroid    Genital warts    GERD (gastroesophageal reflux disease)    Hypertension    PONV (postoperative nausea and vomiting)    RA (rheumatoid arthritis) (HCC)    Radiculopathy     Family History  Problem Relation Age of Onset   Obesity Mother    Coronary artery disease Mother    Bipolar disorder Mother    Multiple sclerosis Mother    Hypertension Father    Heart attack Father    Vascular Disease Father    Breast cancer Maternal Grandmother 54   Past Surgical History:  Procedure Laterality Date   ABDOMINAL HYSTERECTOMY     CARPOMETACARPEL SUSPENSION PLASTY Left 02/13/2023   Procedure: Left trapeziectomy with ligament reconstruction and tendon interposition;  Surgeon: Marlyne Beards, MD;  Location: [facility] OR;  Service: Orthopedics;  Laterality: Left;  regional, he can start earlier if need be to fit   CLOSED REDUCTION FINGER WITH PERCUTANEOUS PINNING Left 02/13/2023   Procedure: possible percutaneous pinning of thumb MCP join;  Surgeon: Marlyne Beards, MD;  Location: [facility] OR;  Service: Orthopedics;  Laterality: Left;  regional he can start earlier if need be to fit   COLONOSCOPY WITH PROPOFOL N/A 06/26/2022   Procedure: COLONOSCOPY WITH PROPOFOL;  Surgeon: Wyline Mood, MD;  Location[facility] ENDOSCOPY;  Service: Gastroenterology;  Laterality: N/A;  Patient informed the office that she wakes from anesthesia combative and pulliing at tubes.  She wanted [place] to be aware.   ESOPHAGOGASTRODUODENOSCOPY (EGD) WITH PROPOFOL N/A 06/26/2022   Procedure: ESOPHAGOGASTRODUODENOSCOPY (EGD) WITH PROPOFOL;  Surgeon:  Wyline Mood, MD;  Location: [facility] ENDOSCOPY;  Service: Gastroenterology;  Laterality: N/A;   PARATHYROIDECTOMY     Social History   Social History Narrative   Not on file   Immunization History  Administered Date(s) Administered   Hep A / Hep B 08/13/2021   Influenza, Seasonal, Injecte, Preservative Fre 10/15/2022   Influenza,inj,Quad PF,6+ Mos 11/01/2014, 11/23/2020, 10/11/2021   Moderna Sars-Covid-2 Vaccination 05/31/2019, 06/28/2019, 01/14/2020   Pneumococcal Conjugate-13 04/28/2014     Objective: Vital Signs: There were no vitals taken for this visit.   Physical Exam   Musculoskeletal Exam: ***  CDAI Exam: CDAI Score: -- Patient Global: --; Provider Global: -- Swollen: --; Tender: -- Joint Exam 04/08/2023   No joint exam has been documented for this visit   There is currently no information documented on the homunculus. Go to the Rheumatology activity and complete the homunculus joint exam.  Investigation: No additional findings.  Imaging: No results found.  Recent Labs: Lab Results  Component Value Date   WBC 4.6 02/13/2023   HGB 14.2 02/13/2023   PLT 176 02/13/2023   NA 142 02/13/2023   K 4.1 02/13/2023   CL 108 02/13/2023   CO2 23 02/13/2023   GLUCOSE 94 02/13/2023   BUN 20 02/13/2023   CREATININE 0.90 02/13/2023   BILITOT 0.3 10/15/2022   ALKPHOS 37 (L) 10/15/2022   AST 23 10/15/2022   ALT 28 10/15/2022   PROT 6.3 10/15/2022   ALBUMIN 4.5 10/15/2022   CALCIUM 9.4 02/13/2023    Speciality Comments: No specialty comments available.  Procedures:  No procedures performed Allergies: Alcohol, Dilantin [phenytoin], and Methotrexate   Assessment / Plan:     Visit Diagnoses: No diagnosis found.  Orders: No orders of the defined types were placed in this encounter.  No orders of the defined types were placed in this encounter.   Face-to-face time spent with patient was *** minutes. Greater than 50% of time was spent in counseling and  coordination of care.  Follow-Up Instructions: No follow-ups on file.   Fuller Plan, MD  Note - This record has been created using [employer].  Chart creation errors have been sought, but may not always  have been located. Such creation errors do not reflect on  the standard of medical care.

## 2023-04-08 ENCOUNTER — Encounter: Payer: Self-pay | Admitting: Internal Medicine

## 2023-04-08 ENCOUNTER — Other Ambulatory Visit: Payer: Self-pay | Admitting: Pharmacist

## 2023-04-08 ENCOUNTER — Ambulatory Visit: Payer: Medicare Other | Attending: Internal Medicine | Admitting: Internal Medicine

## 2023-04-08 VITALS — BP 123/76 | HR 58 | Resp 14 | Ht 65.0 in | Wt 350.0 lb

## 2023-04-08 DIAGNOSIS — Z7189 Other specified counseling: Secondary | ICD-10-CM

## 2023-04-08 DIAGNOSIS — Z79899 Other long term (current) drug therapy: Secondary | ICD-10-CM | POA: Diagnosis not present

## 2023-04-08 DIAGNOSIS — L409 Psoriasis, unspecified: Secondary | ICD-10-CM | POA: Diagnosis not present

## 2023-04-08 DIAGNOSIS — Z111 Encounter for screening for respiratory tuberculosis: Secondary | ICD-10-CM | POA: Insufficient documentation

## 2023-04-08 DIAGNOSIS — N390 Urinary tract infection, site not specified: Secondary | ICD-10-CM | POA: Diagnosis not present

## 2023-04-08 DIAGNOSIS — M069 Rheumatoid arthritis, unspecified: Secondary | ICD-10-CM

## 2023-04-08 DIAGNOSIS — R600 Localized edema: Secondary | ICD-10-CM

## 2023-04-08 DIAGNOSIS — E559 Vitamin D deficiency, unspecified: Secondary | ICD-10-CM | POA: Diagnosis not present

## 2023-04-08 DIAGNOSIS — M51379 Other intervertebral disc degeneration, lumbosacral region without mention of lumbar back pain or lower extremity pain: Secondary | ICD-10-CM

## 2023-04-08 NOTE — Progress Notes (Addendum)
 Therapy plan placed for Actemra IV (J1914) for [facility] to start benefits investigation.  Her next infusion is due 04/22/23 (transferring from [facility]). Approval letter for [facility] patient assistance faxed to media tab  Diagnosis: RA  Provider: Dr. Sheliah Hatch  Dose: 4mg /kg every 4 weeks  Last Clinic Visit: 04/08/23  Chesley Mires, PharmD, MPH, BCPS, CPP Clinical Pharmacist (Rheumatology and Pulmonology)

## 2023-04-08 NOTE — Progress Notes (Signed)
 Pharmacy Note Subjective: Patient presents today to the [facility] for follow up office visit. Patient seen by the pharmacist for counseling on Actemra for rheumatoid arthritis  History of hyperlipidemia: No  History of diverticulitis: No  Objective:  CBC    Component Value Date/Time   WBC 4.6 02/13/2023 1031   RBC 4.44 02/13/2023 1031   HGB 14.2 02/13/2023 1031   HGB 14.3 10/15/2022 0939   HCT 42.3 02/13/2023 1031   HCT 43.4 10/15/2022 0939   PLT 176 02/13/2023 1031   PLT 170 10/15/2022 0939   MCV 95.3 02/13/2023 1031   MCV 98 (H) 10/15/2022 0939   MCH 32.0 02/13/2023 1031   MCHC 33.6 02/13/2023 1031   RDW 12.7 02/13/2023 1031   RDW 12.5 10/15/2022 0939   LYMPHSABS 1.5 10/15/2022 0939   EOSABS 0.1 10/15/2022 0939   BASOSABS 0.0 10/15/2022 0939    CMP     Component Value Date/Time   NA 142 02/13/2023 1031   NA 144 10/15/2022 0939   K 4.1 02/13/2023 1031   CL 108 02/13/2023 1031   CO2 23 02/13/2023 1031   GLUCOSE 94 02/13/2023 1031   BUN 20 02/13/2023 1031   BUN 17 10/15/2022 0939   CREATININE 0.90 02/13/2023 1031   CALCIUM 9.4 02/13/2023 1031   PROT 6.3 10/15/2022 0939   ALBUMIN 4.5 10/15/2022 0939   AST 23 10/15/2022 0939   ALT 28 10/15/2022 0939   ALKPHOS 37 (L) 10/15/2022 0939   BILITOT 0.3 10/15/2022 0939   GFRNONAA >60 02/13/2023 1031     Baseline Immunosuppressant Therapy Labs        Latest Ref Rng & Units 08/15/2022    2:30 PM  Hepatitis  Hep B Surface Ag NON-REACTIVE NON-REACTIVE   Hep C Ab NON-REACTIVE NON-REACTIVE     Lab Results  Component Value Date   HIV NON-REACTIVE 08/15/2022   HIV Non Reactive 12/12/2020       Latest Ref Rng & Units 12/12/2020    2:17 PM  Immunoglobulin Electrophoresis  IgG 586 - 1,602 mg/dL 161   IgM 26 - 096 mg/dL 68        Latest Ref Rng & Units 10/15/2022    9:39 AM  Serum Protein Electrophoresis  Total Protein 6.0 - 8.5 g/dL 6.3     No results found for: "G6PDH"  No results found  for: "TPMT"   Lipid panel Lab Results  Component Value Date   CHOL 145 10/15/2022   HDL 68 10/15/2022   LDLCALC 63 10/15/2022   TRIG 74 10/15/2022   CHOLHDL 2.1 10/15/2022    Assessment/Plan:  Counseled patient that Actemra is an IL-6 blocking agent.  Counseled patient on purpose, proper use, and adverse effects of Actemra.  Reviewed the most common adverse effects including infections, infusion reactions, bowel injury, and rarely cancer and conditions of the nervous system.  Reviewed risk of lipid abnormalities and elevated liver enzymes. Discussed that there is the possibility of an increased risk of malignancy but it is not well understood if this increased risk is due to the medication or the disease state. Counseled patient that Actemra should be held prior to scheduled surgery for infections or new antibiotic use. Counseled patient that Actemra should be held prior to scheduled surgery. Recommend annual influenza, PCV 15 or PCV20 or Pneumovax 23, and Shingrix as indicated.  Reviewed the importance of regular labs while on Actemra therapy including the need for routine lipid panel.  Will recheck lipid panel after 4-8  weeks of starting Actemra, then every 6 months routinely. CBC and CMP will be monitored every 3 months. TB gold will be monitored annually. Standing orders placed.  Provided patient with medication education material and answered all questions.  Patient voiced understanding.  Patient consented to Actemra.  Will upload consent into the media tab.    Actemra dose will be 4mg /kg every 28 days (for rheumatoid arthritis). She is due for next infusion on 04/22/23. She also states that she is approved for [facility] patient assistance program  Referral to [employer] placed.

## 2023-04-08 NOTE — Patient Instructions (Addendum)
 I recommend checking out the [facility] patient-centered guide for fibromyalgia and chronic pain management: [email or web address]://howell-gardner.net/  Referral to [facility] placed for Actemra infusions

## 2023-04-09 ENCOUNTER — Encounter: Payer: Self-pay | Admitting: Family Medicine

## 2023-04-09 ENCOUNTER — Other Ambulatory Visit: Payer: Self-pay

## 2023-04-09 DIAGNOSIS — B372 Candidiasis of skin and nail: Secondary | ICD-10-CM

## 2023-04-09 DIAGNOSIS — M79645 Pain in left finger(s): Secondary | ICD-10-CM | POA: Diagnosis not present

## 2023-04-09 MED ORDER — NYSTATIN 100000 UNIT/GM EX CREA: 1.0000 | TOPICAL_CREAM | Freq: Two times a day (BID) | CUTANEOUS | 0 refills | Status: DC

## 2023-04-10 ENCOUNTER — Telehealth: Payer: Self-pay | Admitting: Pharmacy Technician

## 2023-04-10 LAB — CYCLIC CITRUL PEPTIDE ANTIBODY, IGG: Cyclic Citrullin Peptide Ab: <16 U

## 2023-04-10 LAB — SEDIMENTATION RATE: Sed Rate: 2 mm/h (ref 0–30)

## 2023-04-10 LAB — IGG, IGA, IGM
IgG (Immunoglobin G), Serum: 689 mg/dL (ref 600–1640)
IgM, Serum: 54 mg/dL (ref 50–300)
Immunoglobulin A: 181 mg/dL (ref 47–310)

## 2023-04-10 LAB — VITAMIN D 25 HYDROXY (VIT D DEFICIENCY, FRACTURES): Vit D, 25-Hydroxy: 61 ng/mL (ref 30–100)

## 2023-04-10 LAB — RHEUMATOID FACTOR: Rheumatoid fact SerPl-aCnc: <10 [IU]/mL (ref <14)

## 2023-04-10 NOTE — Telephone Encounter (Signed)
 Auth Submission: APPROVED - BCBS Site of care: Site of care: [facility] Payer: BCBS MEDICARE Medication & CPT/J Code(s) submitted: ACTEMRA [id number] Route of submission (phone, fax, portal):  Phone #[phone] Fax # Units/visits requested: 636MG  Q4WKS - 13 DOSES HAS BEEN APPROVED BY BCBS Reference number: [id number] Approval from: 04/08/23 to 04/06/24   Auth type: FREE DRUG - APPROVED TO RECEIVE FREE DRUG UNTIL TX PLAN D/C OR CHANGE WITH INSURANCE  FYI flag and excell sheet updated.

## 2023-04-15 ENCOUNTER — Encounter: Payer: Self-pay | Admitting: Internal Medicine

## 2023-04-15 DIAGNOSIS — M5412 Radiculopathy, cervical region: Secondary | ICD-10-CM | POA: Diagnosis not present

## 2023-04-15 DIAGNOSIS — E109 Type 1 diabetes mellitus without complications: Secondary | ICD-10-CM | POA: Diagnosis not present

## 2023-04-24 ENCOUNTER — Ambulatory Visit

## 2023-04-24 VITALS — BP 134/81 | HR 54 | Temp 97.5°F | Resp 16 | Ht 65.0 in | Wt 351.4 lb

## 2023-04-24 DIAGNOSIS — Z79899 Other long term (current) drug therapy: Secondary | ICD-10-CM | POA: Diagnosis not present

## 2023-04-24 DIAGNOSIS — M069 Rheumatoid arthritis, unspecified: Secondary | ICD-10-CM

## 2023-04-24 DIAGNOSIS — M79645 Pain in left finger(s): Secondary | ICD-10-CM | POA: Diagnosis not present

## 2023-04-24 DIAGNOSIS — E559 Vitamin D deficiency, unspecified: Secondary | ICD-10-CM

## 2023-04-24 MED ORDER — DIPHENHYDRAMINE HCL 25 MG PO CAPS: 25.0000 mg | ORAL_CAPSULE | Freq: Once | ORAL | Status: DC

## 2023-04-24 MED ORDER — ACETAMINOPHEN 325 MG PO TABS: 650.0000 mg | ORAL_TABLET | Freq: Once | ORAL | Status: DC

## 2023-04-24 MED ORDER — TOCILIZUMAB 400 MG/20ML IV SOLN
4.0000 mg/kg | Freq: Once | INTRAVENOUS | Status: AC
  Administered 2023-04-24: 638 mg via INTRAVENOUS
  Filled 2023-04-24: qty 20

## 2023-04-24 NOTE — Progress Notes (Signed)
 Diagnosis: Rheumatoid Arthritis  Provider:  Chilton Greathouse MD  Procedure: IV Infusion  IV Type: Peripheral, IV Location: R Forearm  Actemra (Tocilizumab), Dose: 638mg   Infusion Start Time: 1504  Infusion Stop Time: 1605  Post Infusion IV Care: Patient declined observation and Peripheral IV Discontinued  Discharge: Condition: Good, Destination: Home . AVS Declined  Performed by:  Adriana Mccallum, RN

## 2023-05-01 ENCOUNTER — Ambulatory Visit: Payer: Medicare Other | Admitting: Urology

## 2023-05-01 ENCOUNTER — Ambulatory Visit: Payer: Self-pay | Admitting: Urology

## 2023-05-07 DIAGNOSIS — E119 Type 2 diabetes mellitus without complications: Secondary | ICD-10-CM | POA: Diagnosis not present

## 2023-05-07 LAB — HM DIABETES EYE EXAM

## 2023-05-12 ENCOUNTER — Encounter: Payer: Self-pay | Admitting: Internal Medicine

## 2023-05-13 MED ORDER — HYDROXYCHLOROQUINE SULFATE 200 MG PO TABS: 200.0000 mg | ORAL_TABLET | Freq: Two times a day (BID) | ORAL | 1 refills | Status: DC

## 2023-05-13 NOTE — Telephone Encounter (Signed)
 Eye exam: not on file has appointment scheduled for 05/16/2023   Labs: 02/26/2023 CBC/CMP WNL  Next Visit: 06/16/2023  Last Visit: 04/08/2023  [id number] arthritis, involving unspecified site, unspecified whether rheumatoid factor present   Current Dose per office note 04/08/2023: Continue Plaquenil 400 mg daily   Okay to refill Plaquenil?

## 2023-05-14 ENCOUNTER — Encounter: Payer: Self-pay | Admitting: Family Medicine

## 2023-05-14 ENCOUNTER — Other Ambulatory Visit: Payer: Self-pay | Admitting: Family Medicine

## 2023-05-14 DIAGNOSIS — E669 Obesity, unspecified: Secondary | ICD-10-CM

## 2023-05-14 DIAGNOSIS — I10 Essential (primary) hypertension: Secondary | ICD-10-CM

## 2023-05-14 DIAGNOSIS — G629 Polyneuropathy, unspecified: Secondary | ICD-10-CM

## 2023-05-15 ENCOUNTER — Ambulatory Visit: Payer: Self-pay

## 2023-05-15 ENCOUNTER — Ambulatory Visit (INDEPENDENT_AMBULATORY_CARE_PROVIDER_SITE_OTHER): Admitting: Family Medicine

## 2023-05-15 VITALS — BP 97/60 | HR 59 | Temp 98.5°F | Ht 65.0 in | Wt 347.1 lb

## 2023-05-15 DIAGNOSIS — R6889 Other general symptoms and signs: Secondary | ICD-10-CM | POA: Diagnosis not present

## 2023-05-15 DIAGNOSIS — E109 Type 1 diabetes mellitus without complications: Secondary | ICD-10-CM | POA: Diagnosis not present

## 2023-05-15 MED ORDER — BENZONATATE 200 MG PO CAPS: 200.0000 mg | ORAL_CAPSULE | Freq: Two times a day (BID) | ORAL | 0 refills | Status: AC | PRN

## 2023-05-15 NOTE — Telephone Encounter (Signed)
  Chief Complaint: coughing up yellow and brown phlegm  Symptoms: headache, runny nose, facial pain, fever to 100 Frequency: 4 days Pertinent Negatives: Patient denies SOB,  Disposition: [] ED /[] Urgent Care (no appt availability in office) / [x] Appointment(In office/virtual)/ []  [facility]/ [] Home Care/ [] Refused Recommended Disposition /[] [facility]/ []  Follow-up with PCP Additional Notes: made a nurse visit per Pt message from PCP Copied from CRM [id number]. Topic: Clinical - Red Word Triage >> May 15, 2023  9:23 AM Stanly Early wrote: Red Word that prompted transfer to Nurse Triage: Headache/Fever around 100 for the last 4 days. Coughing/congested. Her pcp told her to schedule a nurse visit for rsv/flu and covid testings Reason for Disposition  Coughing up rusty-colored (reddish-brown) sputum  Answer Assessment - Initial Assessment Questions 1. ONSET: "When did the cough begin?"      4 days  2. SEVERITY: "How bad is the cough today?"      Coughing spells 3. SPUTUM: "Describe the color of your sputum" (none, dry cough; clear, white, yellow, green)     Brown yellow 4. HEMOPTYSIS: "Are you coughing up any blood?" If so ask: "How much?" (flecks, streaks, tablespoons, etc.)     no 5. DIFFICULTY BREATHING: "Are you having difficulty breathing?" If Yes, ask: "How bad is it?" (e.g., mild, moderate, severe)    - MILD: No SOB at rest, mild SOB with walking, speaks normally in sentences, can lie down, no retractions, pulse < 100.    - MODERATE: SOB at rest, SOB with minimal exertion and prefers to sit, cannot lie down flat, speaks in phrases, mild retractions, audible wheezing, pulse 100-120.    - SEVERE: Very SOB at rest, speaks in single words, struggling to breathe, sitting hunched forward, retractions, pulse > 120      no 6. FEVER: "Do you have a fever?" If Yes, ask: "What is your temperature, how was it measured, and when did it start?"     1000 10. OTHER SYMPTOMS: "Do you  have any other symptoms?" (e.g., runny nose, wheezing, chest pain)       Night sweating, chest heaviness Facial pain pressure, runny nose, headache, joints hurt  Protocols used: Cough - Acute Productive-A-AH

## 2023-05-16 DIAGNOSIS — Z79899 Other long term (current) drug therapy: Secondary | ICD-10-CM | POA: Diagnosis not present

## 2023-05-17 LAB — COVID-19, FLU A+B AND RSV
Influenza A, NAA: NOT DETECTED
Influenza B, NAA: NOT DETECTED
RSV, NAA: NOT DETECTED
SARS-CoV-2, NAA: DETECTED — AB

## 2023-05-18 NOTE — Progress Notes (Signed)
 Testing for covid/flu/rsv.

## 2023-05-19 ENCOUNTER — Encounter: Payer: Self-pay | Admitting: Family Medicine

## 2023-05-20 DIAGNOSIS — M1812 Unilateral primary osteoarthritis of first carpometacarpal joint, left hand: Secondary | ICD-10-CM | POA: Diagnosis not present

## 2023-05-22 ENCOUNTER — Ambulatory Visit

## 2023-05-25 ENCOUNTER — Other Ambulatory Visit: Payer: Self-pay | Admitting: Family Medicine

## 2023-05-25 DIAGNOSIS — B372 Candidiasis of skin and nail: Secondary | ICD-10-CM

## 2023-05-27 MED ORDER — NYSTATIN 100000 UNIT/GM EX CREA: 1.0000 | TOPICAL_CREAM | Freq: Two times a day (BID) | CUTANEOUS | 0 refills | Status: AC

## 2023-05-29 ENCOUNTER — Other Ambulatory Visit: Payer: Self-pay

## 2023-05-29 ENCOUNTER — Ambulatory Visit (INDEPENDENT_AMBULATORY_CARE_PROVIDER_SITE_OTHER)

## 2023-05-29 VITALS — BP 128/72 | HR 58 | Temp 97.6°F | Resp 10 | Ht 64.0 in | Wt 352.8 lb

## 2023-05-29 DIAGNOSIS — Z79899 Other long term (current) drug therapy: Secondary | ICD-10-CM

## 2023-05-29 DIAGNOSIS — E559 Vitamin D deficiency, unspecified: Secondary | ICD-10-CM

## 2023-05-29 DIAGNOSIS — M069 Rheumatoid arthritis, unspecified: Secondary | ICD-10-CM

## 2023-05-29 MED ORDER — DIPHENHYDRAMINE HCL 25 MG PO CAPS
25.0000 mg | ORAL_CAPSULE | Freq: Once | ORAL | Status: DC
Start: 2023-05-29 — End: 2023-05-29

## 2023-05-29 MED ORDER — ACETAMINOPHEN 325 MG PO TABS: 650.0000 mg | ORAL_TABLET | Freq: Once | ORAL | Status: DC

## 2023-05-29 MED ORDER — TOCILIZUMAB 400 MG/20ML IV SOLN
4.0000 mg/kg | Freq: Once | INTRAVENOUS | Status: AC
  Administered 2023-05-29: 630 mg via INTRAVENOUS
  Filled 2023-05-29: qty 20

## 2023-05-29 NOTE — Progress Notes (Signed)
 Diagnosis: Rheumatoid Arthritis  Provider:  Praveen Mannam MD  Procedure: IV Infusion  IV Type: Peripheral, IV Location: L Antecubital  Actemra  (Tocilizumab ), Dose: 630mg   Infusion Start Time: 1055  Infusion Stop Time: 1200  Post Infusion IV Care: Peripheral IV Discontinued  Discharge: Condition: Good, Destination: Home . AVS Declined  Performed by:  Aunica Dauphinee, RN

## 2023-05-30 LAB — COMPREHENSIVE METABOLIC PANEL WITH GFR
AG Ratio: 2.3 (calc) (ref 1.0–2.5)
ALT: 24 U/L (ref 6–29)
AST: 21 U/L (ref 10–35)
Albumin: 4.4 g/dL (ref 3.6–5.1)
Alkaline phosphatase (APISO): 38 U/L (ref 37–153)
BUN: 19 mg/dL (ref 7–25)
CO2: 26 mmol/L (ref 20–32)
Calcium: 9.7 mg/dL (ref 8.6–10.4)
Chloride: 106 mmol/L (ref 98–110)
Creat: 0.71 mg/dL (ref 0.50–1.03)
Globulin: 1.9 g/dL (ref 1.9–3.7)
Glucose, Bld: 98 mg/dL (ref 65–99)
Potassium: 4.1 mmol/L (ref 3.5–5.3)
Sodium: 142 mmol/L (ref 135–146)
Total Bilirubin: 0.7 mg/dL (ref 0.2–1.2)
Total Protein: 6.3 g/dL (ref 6.1–8.1)
eGFR: 101 mL/min/{1.73_m2} (ref >=60)

## 2023-05-30 LAB — CBC WITH DIFFERENTIAL/PLATELET
Absolute Lymphocytes: 1368 {cells}/uL (ref 850–3900)
Absolute Monocytes: 496 {cells}/uL (ref 200–950)
Basophils Absolute: 20 {cells}/uL (ref 0–200)
Basophils Relative: 0.5 %
Eosinophils Absolute: 68 {cells}/uL (ref 15–500)
Eosinophils Relative: 1.7 %
HCT: 42.5 % (ref 35.0–45.0)
Hemoglobin: 14 g/dL (ref 11.7–15.5)
MCH: 31.2 pg (ref 27.0–33.0)
MCHC: 32.9 g/dL (ref 32.0–36.0)
MCV: 94.7 fL (ref 80.0–100.0)
MPV: 12 fL (ref 7.5–12.5)
Monocytes Relative: 12.4 %
Neutro Abs: 2048 {cells}/uL (ref 1500–7800)
Neutrophils Relative %: 51.2 %
Platelets: 186 10*3/uL (ref 140–400)
RBC: 4.49 10*6/uL (ref 3.80–5.10)
RDW: 13.9 % (ref 11.0–15.0)
Total Lymphocyte: 34.2 %
WBC: 4 10*3/uL (ref 3.8–10.8)

## 2023-05-30 LAB — LIPID PANEL
Cholesterol: 141 mg/dL (ref <200)
HDL: 70 mg/dL (ref >=50)
LDL Cholesterol (Calc): 53 mg/dL
Non-HDL Cholesterol (Calc): 71 mg/dL (ref <130)
Total CHOL/HDL Ratio: 2 (calc) (ref <5.0)
Triglycerides: 99 mg/dL (ref <150)

## 2023-06-03 DIAGNOSIS — M47816 Spondylosis without myelopathy or radiculopathy, lumbar region: Secondary | ICD-10-CM | POA: Diagnosis not present

## 2023-06-05 ENCOUNTER — Other Ambulatory Visit: Payer: Self-pay | Admitting: Obstetrics and Gynecology

## 2023-06-05 DIAGNOSIS — Z1231 Encounter for screening mammogram for malignant neoplasm of breast: Secondary | ICD-10-CM

## 2023-06-11 ENCOUNTER — Encounter: Payer: Self-pay | Admitting: Family Medicine

## 2023-06-15 ENCOUNTER — Encounter (INDEPENDENT_AMBULATORY_CARE_PROVIDER_SITE_OTHER): Payer: Self-pay

## 2023-06-15 DIAGNOSIS — E109 Type 1 diabetes mellitus without complications: Secondary | ICD-10-CM | POA: Diagnosis not present

## 2023-06-16 ENCOUNTER — Other Ambulatory Visit: Payer: Self-pay | Admitting: Family Medicine

## 2023-06-16 ENCOUNTER — Ambulatory Visit: Admitting: Internal Medicine

## 2023-06-16 DIAGNOSIS — L304 Erythema intertrigo: Secondary | ICD-10-CM

## 2023-06-16 DIAGNOSIS — L401 Generalized pustular psoriasis: Secondary | ICD-10-CM

## 2023-06-17 ENCOUNTER — Encounter: Payer: Self-pay | Admitting: Family Medicine

## 2023-06-17 ENCOUNTER — Ambulatory Visit (INDEPENDENT_AMBULATORY_CARE_PROVIDER_SITE_OTHER): Admitting: Family Medicine

## 2023-06-17 VITALS — BP 134/76 | HR 65 | Ht 64.0 in | Wt 349.2 lb

## 2023-06-17 DIAGNOSIS — L578 Other skin changes due to chronic exposure to nonionizing radiation: Secondary | ICD-10-CM | POA: Diagnosis not present

## 2023-06-17 DIAGNOSIS — E1142 Type 2 diabetes mellitus with diabetic polyneuropathy: Secondary | ICD-10-CM

## 2023-06-17 DIAGNOSIS — R600 Localized edema: Secondary | ICD-10-CM

## 2023-06-17 DIAGNOSIS — L304 Erythema intertrigo: Secondary | ICD-10-CM

## 2023-06-17 MED ORDER — MUPIROCIN 2 % EX OINT: 1.0000 | TOPICAL_OINTMENT | Freq: Two times a day (BID) | CUTANEOUS | 1 refills | Status: AC

## 2023-06-17 MED ORDER — CLOTRIMAZOLE 1 % EX CREA
1.0000 | TOPICAL_CREAM | Freq: Two times a day (BID) | CUTANEOUS | 1 refills | Status: AC
Start: 2023-06-17 — End: ?

## 2023-06-17 NOTE — Patient Instructions (Signed)
 It was nice to see you today,  We addressed the following topics today: -Apply the antifungal cream in the morning, then put a layer of Desitin or Boudreau's paste on it, 4 hours later apply the antibacterial cream mupirocin followed by the Desitin/Boudreau's.  Do this twice a day - Continue doing it for least 7 days after your rash resolves. - I will send in a referral to a wound care clinic to see if they can treat it. - I will look into other treatment options for your edema.  Have a great day,  Etha Henle, MD

## 2023-06-17 NOTE — Progress Notes (Signed)
 Established Patient Office Visit  Subjective   [name], female    DOB: Apr 13, 1970  Age: 53 y.o. MRN: [id number]  Chief Complaint  Patient presents with   Weight Check   Leg Swelling    HPI Subjective - Rash in groin area bilaterally, now also under one breast - Rash is bright red, shiny, painful, skin breaking open - Rash worsens when swimming in pool, feels burning - Tried Nystatin  for 2 weeks without improvement, reports it made condition worse - Uses Dr. Lambert Pillion paste which helps temporarily but rash returns when discontinued - Developed sun-induced rash on non-exposed areas when outdoors - Sun rash appears within hours of exposure, resolves in about 2 hours - Sun rash burns when showering, no itching, swelling, or respiratory symptoms - Reports scar from C-section, hysterectomy, and revision with tunneling that keeps breaking open - Reports left leg swelling that cycles without clear pattern - Left leg swelling so severe it limits knee and ankle mobility - Tried Lasix  in addition to HCTZ without improvement - Waking every 2 hours at night to urinate - Reports morning improvement in leg swelling that returns within 2 hours - Reports tendon pain in groin area - Reports knee pain - Reports headaches attributed to cyst on C2 - Reports brain fog - Reports family history of skin cancer (grandmother, mother, father)  Medications: Actemra  monthly (on for 7 years), hydroxychloroquine , Lyrica for neuropathy, Ozempic  0.5 mg (titrating to 1 mg), hydrochlorothiazide .  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  PSH: C-section, hysterectomy, revision surgery with tunneling.  FH: Family history of skin cancer (grandmother, mother, father).  Social Hx: Moved from [place]  to current location 2.5 years ago. Previously lived in [place]  for 25-26 years.  ROS: Positive for  pain with rash, leg swelling, nocturia, headaches, joint pain. Negative for itching, respiratory symptoms with sun rash.    The 10-year ASCVD risk score (Arnett DK, et al., 2019) is: 2.7%  Health Maintenance Due  Topic Date Due   DTaP/Tdap/Td (1 - Tdap) Never done   Zoster Vaccines- Shingrix (1 of 2) Never done   Pneumococcal Vaccine 76-36 Years old (2 of 2 - PPSV23) 06/23/2014   COVID-19 Vaccine (4 - 2024-25 season) 09/15/2022   Medicare Annual Wellness (AWV)  05/07/2023   HEMOGLOBIN A1C  06/16/2023      Objective:     BP 134/76   Pulse 65   Ht 5\' 4"  (1.626 m)   Wt (!) 349 lb 4 oz (158.4 kg)   SpO2 97%   BMI 59.95 kg/m    Physical Exam General: Alert, oriented Skin: Mild linear erythematous rash under the left breast    No results found for any visits on 06/17/23.      Assessment & Plan:   Intertrigo Assessment & Plan:    - Likely fungal vs bacterial etiology, possibly exacerbated by immunosuppression    - Prescribe clotrimazole  to use in place of nystatin .  Prescribing mupirocin  oint as well.  Apply each bid, use barrier cream in addition.     - Refer to wound care for evaluation and management   Dermatitis due to sun Assessment & Plan: Pt presented photos of macular rash on anterior torso that appear after sun exposure, notably in the summer.  No rash visible today.  Rash is non pruritic but 'burns' when in the shower.  Rash appeared larger in size than what  you would expect with milaria rubra.  Possibly heat induced eczema vs SE of her actemra     - recommendedPatient using protective clothing as workaround    - advised pt to discuss with her dermatologist   Morbid obesity (HCC) Assessment & Plan:    - Currently on Ozempic  0.5 mg    - Lost 3-4 pounds since last visit    - Plan to titrate to 1 mg when current supply (4 pens) is depleted   Diabetic polyneuropathy associated with type 2 diabetes mellitus (HCC) Assessment & Plan:    - Confirmed by nerve  conduction studies    - Continue Lyrica   Bilateral lower extremity edema Assessment & Plan: Pt states thiazide has not helped. Would favor lipidema, possibly multifactorial with elements of edema but pt states previous venous workup has been normal and cardiac workup has been negative as well.     - Likely regular edema rather than lymphedema based on morning improvement    - Previous vascular evaluation normal    - HCTZ and Lasix  trial unsuccessful. Continue thiazide for bp.      - if lipidema would require liposuction. Would need to discuss this with pt at next visit   Other orders -     Clotrimazole ; Apply 1 Application topically 2 (two) times daily.  Dispense: 60 g; Refill: 1 -     Mupirocin ; Apply 1 Application topically 2 (two) times daily.  Dispense: 30 g; Refill: 1      Return in about 3 months (around 09/17/2023) for weight, rash, edema.    Laneta Pintos, MD

## 2023-06-23 DIAGNOSIS — L578 Other skin changes due to chronic exposure to nonionizing radiation: Secondary | ICD-10-CM | POA: Insufficient documentation

## 2023-06-23 NOTE — Assessment & Plan Note (Signed)
-   Confirmed by nerve conduction studies    - Continue Lyrica

## 2023-06-23 NOTE — Assessment & Plan Note (Signed)
 Pt presented photos of macular rash on anterior torso that appear after sun exposure, notably in the summer.  No rash visible today.  Rash is non pruritic but 'burns' when in the shower.  Rash appeared larger in size than what you would expect with milaria rubra.  Possibly heat induced eczema vs SE of her actemra     - recommendedPatient using protective clothing as workaround    - advised pt to discuss with her dermatologist

## 2023-06-23 NOTE — Assessment & Plan Note (Signed)
-   Likely fungal vs bacterial etiology, possibly exacerbated by immunosuppression    - Prescribe clotrimazole  to use in place of nystatin .  Prescribing mupirocin  oint as well.  Apply each bid, use barrier cream in addition.     - Refer to wound care for evaluation and management

## 2023-06-23 NOTE — Assessment & Plan Note (Signed)
-   Currently on Ozempic  0.5 mg    - Lost 3-4 pounds since last visit    - Plan to titrate to 1 mg when current supply (4 pens) is depleted

## 2023-06-23 NOTE — Assessment & Plan Note (Addendum)
 Pt states thiazide has not helped. Would favor lipidema, possibly multifactorial with elements of edema but pt states previous venous workup has been normal and cardiac workup has been negative as well.     - Likely regular edema rather than lymphedema based on morning improvement    - Previous vascular evaluation normal    - HCTZ and Lasix  trial unsuccessful. Continue thiazide for bp.      - if lipidema would require liposuction. Would need to discuss this with pt at next visit

## 2023-06-25 ENCOUNTER — Ambulatory Visit: Attending: Internal Medicine | Admitting: Internal Medicine

## 2023-06-25 ENCOUNTER — Other Ambulatory Visit: Payer: Self-pay | Admitting: Gastroenterology

## 2023-06-25 ENCOUNTER — Ambulatory Visit

## 2023-06-25 ENCOUNTER — Encounter: Payer: Self-pay | Admitting: Internal Medicine

## 2023-06-25 ENCOUNTER — Encounter: Payer: Self-pay | Admitting: Family Medicine

## 2023-06-25 VITALS — BP 122/72 | HR 67 | Resp 16 | Ht 64.0 in | Wt 351.0 lb

## 2023-06-25 DIAGNOSIS — M069 Rheumatoid arthritis, unspecified: Secondary | ICD-10-CM

## 2023-06-25 DIAGNOSIS — L409 Psoriasis, unspecified: Secondary | ICD-10-CM

## 2023-06-25 DIAGNOSIS — M25551 Pain in right hip: Secondary | ICD-10-CM

## 2023-06-25 DIAGNOSIS — M25552 Pain in left hip: Secondary | ICD-10-CM

## 2023-06-25 DIAGNOSIS — Z79899 Other long term (current) drug therapy: Secondary | ICD-10-CM | POA: Diagnosis not present

## 2023-06-25 NOTE — Progress Notes (Signed)
 Office Visit Note  Patient: Elizabeth Patrick             Date of Birth: Jul 22, 1970           MRN: [id number]             PCP: Laneta Pintos, MD Referring: Laneta Pintos, MD Visit Date: 06/25/2023   Subjective:  Follow-up (Patient states she had COVID last month. Patient states she has new pain in areas that were not hurting before. Patient states she would like to discuss supplements and lab results. Patient states her brain fog is getting worse. )   Discussed the use of AI scribe software for clinical note transcription with the patient, who gave verbal consent to proceed.  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  She has experienced significant joint pain and skin issues, with a history of psoriasis. Her symptoms worsened after contracting COVID-19 last month, which lasted two and a half weeks and resulted in lingering exhaustion and joint aches, but no significant respiratory symptoms. During this time, her psoriasis flared, with peeling skin and blisters on her hands and feet.  She has been on Actemra  infusions for seven years for joint issues, but reports progressive deterioration in her condition. Initially, she was able to work full-time and perform daily activities, but her condition has worsened to the point where she now requires a cane for mobility. She has also been on prednisone  for two and a half years. Recently, she started Ozempic  to aid with weight loss, as her weight has been a contributing factor to her joint issues.  She experiences significant pain in her knees, hips, and elbows, with her left leg swelling and causing frequent falls. She has been using HCTZ to manage the swelling, but it has not been effective. She also reports neuropathy, which makes wearing compression garments difficult. She has a history of yeast  infections and rashes, particularly in areas where skin touches skin, and has been managing these with topical treatments.  Her medication history includes methotrexate, which she discontinued due to liver issues and mouth sores. She has been on hydroxychloroquine  for a year and a half, but is uncertain of its effectiveness in reducing her pain. She wants to reduce her medication burden if possible.     Previous HPI 04/08/23 Elizabeth Patrick is a 53 year old female with osteoarthritis, seronegative rheumatoid arthritis, psoriasis, and chronic neuropathic pain who presents for transferring care of her RA.   She has a history of seronegative rheumatoid arthritis diagnosed ten years ago based on polyarticular small joint inflammation and highly abnormal Vectra labwork. She is currently receiving Actemra  infusions monthly, with her next dose scheduled for April 8th. She has tried multiple medications in the past, including Enbrel, Humira, Xeljanz, methotrexate, and leflunomide, with varying degrees of success and side effects. Actemra  is 'okay' but causes GI symptoms after each infusion. She has not tried subcutaneous Actemra . She is tired all the time, experiences pain in small joints like her hands and toes, and has headaches and shoulder pain. No frequent infections while on Actemra , except for a recent UTI. She experiences swelling in her legs and varicose veins.   She underwent surgery on her thumb at the end of January for osteoarthritis. Post-surgery, the neuropathy in her hand has resolved, although she continues to experience  significant pain in her hands. She has been using Plaquenil  for the past eight to nine months but is unsure of its efficacy. She experiences painful hands and has undergone cortisone injections every three months and ablation for her back pain due to degenerative changes.   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 She has received spinal injections, which she now realizes were effective as her symptoms have returned. She experiences neuropathy, particularly at night, and takes a high dose of Lyrica to manage it.   She has psoriasis on her palms and soles, which flares up with stress or when her body is 'overtaxed'. She is currently experiencing a breakout on her feet. She uses clobetasol  for her psoriasis and nystatin  for intertrigo, which she is managing due to weight loss-related skin changes.   She has a history of diabetes, diagnosed with a hemoglobin A1c of 6.1, but reports no significant hyperglycemia or complications such as eye or kidney problems. She is on Ozempic  for diabetes management. She also reports a history of mild carpal tunnel syndrome diagnosed before her RA diagnosis.   She has moved to a house without stairs to accommodate her mobility issues.    DMARD Hx Methotrexate - PO GI Sx, [place] injection site reactions Leflunomide - Nonresponder Humira - Nonresponder (?) Enbrel - Nonresponder (?) Xeljanz - Nonresponder Actemra  - Current treatment Hydroxychloroquine  - Current treatment   Labs reviewed 02/2023 CBC wnl CMP wnl Lipids TGs high   10/2022 HAV/HBV/HCV neg TB neg   Review of Systems  Constitutional:  Positive for fatigue.  HENT:  Positive for mouth sores and mouth dryness.   Eyes:  Positive for dryness.  Respiratory:  Negative for shortness of breath.   Cardiovascular:  Negative for chest pain and palpitations.  Gastrointestinal:  Negative for blood in stool, constipation and diarrhea.  Endocrine: Positive for increased urination.  Genitourinary:  Positive for involuntary urination.  Musculoskeletal:  Positive for joint pain, gait problem, joint pain, joint swelling, myalgias, morning stiffness, muscle tenderness and myalgias. Negative for muscle weakness.  Skin:  Positive for rash and sensitivity to sunlight. Negative for color change  and hair loss.  Allergic/Immunologic: Positive for susceptible to infections.  Neurological:  Positive for dizziness and headaches.  Hematological:  Negative for swollen glands.  Psychiatric/Behavioral:  Positive for sleep disturbance. Negative for depressed mood. The patient is not nervous/anxious.     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   Vitamin B12 deficiency 07/23/2021   Severe sleep apnea 07/04/2021   Psoriasis and similar disorder 05/27/2021   Depression, major, single episode, moderate (HCC) 04/29/2021   Loud snoring 04/17/2021   Menopause 04/16/2021   Morbid obesity (HCC) 04/05/2021   Osteoarthritis of both knees 04/04/2021   Type 2 diabetes mellitus without complication, with long-term current use of insulin  (HCC) 11/28/2020   Gastroesophageal reflux disease 11/28/2020   Recurrent UTI 10/17/2020   Hyperlipidemia associated with type 2 diabetes mellitus (HCC) 10/11/2020   Bilateral lower extremity edema 10/11/2020   Diabetic polyneuropathy associated with type  2 diabetes mellitus (HCC) 10/11/2020   NASH (nonalcoholic steatohepatitis) 10/10/2020   Rheumatoid arthritis (HCC) 09/26/2020   Hypertension associated with diabetes (HCC) 09/26/2020   Osteoarthritis 09/26/2020    Past Medical History:  Diagnosis Date   Abnormal uterine bleeding    Anemia    Blood transfusion without reported diagnosis    Complication of anesthesia    can wake up during procedure   Diabetes mellitus without complication (HCC)    Dysmenorrhea    Endometriosis    Fibroid    Genital warts    GERD  (gastroesophageal reflux disease)    Hypertension    PONV (postoperative nausea and vomiting)    RA (rheumatoid arthritis) (HCC)    Radiculopathy     Family History  Problem Relation Age of Onset   Obesity Mother    Coronary artery disease Mother    Bipolar disorder Mother    Multiple sclerosis Mother    Hypertension Father    Heart attack Father    Vascular Disease Father    Breast cancer Maternal Grandmother 41   Past Surgical History:  Procedure Laterality Date   ABDOMINAL HYSTERECTOMY     CARPOMETACARPEL SUSPENSION PLASTY Left 02/13/2023   Procedure: Left trapeziectomy with ligament reconstruction and tendon interposition;  Surgeon: Marilyn Shropshire, MD;  Location: [facility] OR;  Service: Orthopedics;  Laterality: Left;  regional, he can start earlier if need be to fit   CLOSED REDUCTION FINGER WITH PERCUTANEOUS PINNING Left 02/13/2023   Procedure: possible percutaneous pinning of thumb MCP join;  Surgeon: Marilyn Shropshire, MD;  Location: [facility] OR;  Service: Orthopedics;  Laterality: Left;  regional he can start earlier if need be to fit   COLONOSCOPY WITH PROPOFOL  N/A 06/26/2022   Procedure: COLONOSCOPY WITH PROPOFOL ;  Surgeon: Luke Salaam, MD;  Location: [facility] ENDOSCOPY;  Service: Gastroenterology;  Laterality: N/A;  Patient informed the office that she wakes from anesthesia combative and pulliing at tubes.  She wanted us  to be aware.   ESOPHAGOGASTRODUODENOSCOPY (EGD) WITH PROPOFOL  N/A 06/26/2022   Procedure: ESOPHAGOGASTRODUODENOSCOPY (EGD) WITH PROPOFOL ;  Surgeon: Luke Salaam, MD;  Location: [facility] ENDOSCOPY;  Service: Gastroenterology;  Laterality: N/A;   PARATHYROIDECTOMY     Social History   Social History Narrative   Not on file   Immunization History  Administered Date(s) Administered   Hep A / Hep B 08/13/2021   Influenza, Seasonal, Injecte, Preservative Fre 10/15/2022   Influenza,inj,Quad PF,6+ Mos 11/01/2014, 11/23/2020, 10/11/2021   Moderna Sars-Covid-2 Vaccination  05/31/2019, 06/28/2019, 01/14/2020   Pneumococcal Conjugate-13 04/28/2014     Objective: Vital Signs: BP 122/72 (BP Location: Left Arm, Patient Position: Sitting, Cuff Size: Normal)   Pulse 67   Resp 16   Ht 5' 4 (1.626 m)   Wt (!) 351 lb (159.2 kg)   BMI 60.25 kg/m    Physical Exam Constitutional:      Appearance: She is obese.   Eyes:     Conjunctiva/sclera: Conjunctivae normal.    Cardiovascular:     Rate and Rhythm: Normal rate and regular rhythm.  Pulmonary:     Effort: Pulmonary effort is normal.     Breath sounds: Normal breath sounds.  Lymphadenopathy:     Cervical: No cervical adenopathy.   Skin:    General: Skin is warm and dry.     Comments: Superficial venous varicosities b/l legs Mostly resolving appearing pustular rash on soles of feet, some callus and dry skin no plaques   Neurological:     Mental Status: She  is alert.   Psychiatric:        Mood and Affect: Mood normal.      Musculoskeletal Exam:  Elbows full ROM no tenderness or swelling Right wrist painful with full extension, PIP and DIP joint tenderness to pressure R>L, heberdon's nodes present, right 1st CMC squaring with mild MCP subluxation, reducible with a lot of pain Tenderness throughout multiple areas on upper and lower back Right hip very painful with rotation and wight weight bearing, tenderness on anterior and into groin Knees tenderness to pressure on anterior and medial joint line, no palpable effusions Ankles full ROM no tenderness or swelling  Investigation: No additional findings.  Imaging: No results found.  Recent Labs: Lab Results  Component Value Date   WBC 5.2 06/25/2023   HGB 14.5 06/25/2023   PLT 175 06/25/2023   NA 142 06/25/2023   K 4.1 06/25/2023   CL 104 06/25/2023   CO2 30 06/25/2023   GLUCOSE 97 06/25/2023   BUN 20 06/25/2023   CREATININE 0.88 06/25/2023   BILITOT 0.6 06/25/2023   ALKPHOS 37 (L) 10/15/2022   AST 23 06/25/2023   ALT 24 06/25/2023    PROT 6.9 06/25/2023   ALBUMIN 4.5 10/15/2022   CALCIUM  10.0 06/25/2023    Speciality Comments: PLQ eye exam: 05/16/2023 No Toxicity @[facility]  Procedures:  No procedures performed Allergies: Alcohol, Dilantin [phenytoin], and Methotrexate   Assessment / Plan:     Visit Diagnoses: Psoriasis and similar disorder  Rheumatoid arthritis, involving unspecified site, unspecified whether rheumatoid factor present (HCC)- Plan: Sedimentation rate, C-reactive protein Chronic joint pain and inflammation consistent with psoriatic arthritis. Actemra  limited effectiveness after 7 years, patient prefers discontinuation. Cosentyx chosen for efficacy and rapid action sicne current picture fits more with PsA vs seronegative RA. - Discontinue Actemra . - Initiate Cosentyx injections 300 mg [place] with loading dose at 0 and 4 weeks, then monthly.  High risk medication use - Plan: CBC with Differential/Platelet, Comprehensive metabolic panel with GFR Reviewed risks of medication including injection reactions and infections. Hx of intertriginous rashes maybe high yeast infection risk but never serious infection Hx. -Checking CBC and CMP for medication monitoring  Bilateral hip pain - Plan: XR HIPS BILAT W OR W/O PELVIS 3-4 VIEWS Xray bilateral hip with severe OA. Recommend orthopedic consultation she is an established patient with [facility] previously but for knees, can try to f/u there first but will also sent referral.  Palmar pustular psoriasis Exacerbation with peeling and blistering, possibly worsened by recent COVID-19 infection.  Lymphedema Chronic left leg lymphedema with significant swelling and discomfort. Compression therapy difficult due to neuropathy and pain. Chronic neuropathy contributing to pain and difficulty with compression therapy. - Consider pneumatic compression therapy.  Intertriginous rash Recurrent rash due to skin folds, exacerbated by weight loss and skin  contact.  Photosensitivity due to hydroxychloroquine  Photosensitivity likely from hydroxychloroquine , causing hives and blotchy skin upon sun exposure.  C  Orders: Orders Placed This Encounter  Procedures   XR HIPS BILAT W OR W/O PELVIS 3-4 VIEWS   Sedimentation rate   C-reactive protein   CBC with Differential/Platelet   Comprehensive metabolic panel with GFR   Ambulatory referral to Orthopedic Surgery   No orders of the defined types were placed in this encounter.    Follow-Up Instructions: Return in about 3 months (around 09/25/2023) for ?PsA/?RA COS start f/u 3mos.   Matt Song, MD  Note - This record has been created using [employer].  Chart creation  errors have been sought, but may not always  have been located. Such creation errors do not reflect on  the standard of medical care.

## 2023-06-25 NOTE — Progress Notes (Signed)
 Pharmacy Note  Subjective:  Patient presents today to [facility] for follow up office visit.  Patient was seen by the pharmacist for counseling on Cosentyx for psoriatic arthritis and plaque psoriasis. Next Actemra  infusion scheduled for tomorrow, 06/26/2023  History of inflammatory bowel disease: No  Objective:  CBC    Component Value Date/Time   WBC 4.0 05/29/2023 0920   RBC 4.49 05/29/2023 0920   HGB 14.0 05/29/2023 0920   HGB 14.3 10/15/2022 0939   HCT 42.5 05/29/2023 0920   HCT 43.4 10/15/2022 0939   PLT 186 05/29/2023 0920   PLT 170 10/15/2022 0939   MCV 94.7 05/29/2023 0920   MCV 98 (H) 10/15/2022 0939   MCH 31.2 05/29/2023 0920   MCHC 32.9 05/29/2023 0920   RDW 13.9 05/29/2023 0920   RDW 12.5 10/15/2022 0939   LYMPHSABS 1.5 10/15/2022 0939   EOSABS 68 05/29/2023 0920   EOSABS 0.1 10/15/2022 0939   BASOSABS 20 05/29/2023 0920   BASOSABS 0.0 10/15/2022 0939    CMP     Component Value Date/Time   NA 142 05/29/2023 0920   NA 144 10/15/2022 0939   K 4.1 05/29/2023 0920   CL 106 05/29/2023 0920   CO2 26 05/29/2023 0920   GLUCOSE 98 05/29/2023 0920   BUN 19 05/29/2023 0920   BUN 17 10/15/2022 0939   CREATININE 0.71 05/29/2023 0920   CALCIUM  9.7 05/29/2023 0920   PROT 6.3 05/29/2023 0920   PROT 6.3 10/15/2022 0939   ALBUMIN 4.5 10/15/2022 0939   AST 21 05/29/2023 0920   ALT 24 05/29/2023 0920   ALKPHOS 37 (L) 10/15/2022 0939   BILITOT 0.7 05/29/2023 0920   BILITOT 0.3 10/15/2022 0939   GFRNONAA >60 02/13/2023 1031    Baseline Immunosuppressant Therapy Labs TB GOLD   Hepatitis Panel    Latest Ref Rng & Units 08/15/2022    2:30 PM  Hepatitis  Hep B Surface Ag NON-REACTIVE NON-REACTIVE   Hep C Ab NON-REACTIVE NON-REACTIVE    HIV Lab Results  Component Value Date   HIV NON-REACTIVE 08/15/2022   HIV Non Reactive 12/12/2020   Immunoglobulins    Latest Ref Rng & Units 04/08/2023    8:47 AM  Immunoglobulin Electrophoresis  IgA  47 - 310  mg/dL 295   IgG 621 - 3,086 mg/dL 578   IgM 50 - 469 mg/dL 54    SPEP    Latest Ref Rng & Units 05/29/2023    9:20 AM  Serum Protein Electrophoresis  Total Protein 6.1 - 8.1 g/dL 6.3    Assessment/Plan:  Previously treated with MTX leflunomide, Enbrel, Humira, Xeljanz  Counseled patient that Cosentyx is a IL-17 inhibitor.  Counseled patient on purpose, proper use, and adverse effects of Cosentyx. Reviewed the most common adverse effects of infection (more commonly nasopharyngitis, URTI), inflammatory bowel disease, and allergic reaction.  Counseled patient that Cosentyx should be held prior to scheduled surgery.  Counseled patient to avoid live vaccines while on Cosentyx.  Recommend annual influenza, PCV 15 or PCV20 or Pneumovax 23, and Shingrix as indicated.  Reviewed the importance of regular labs while on Cosentyx.  Will monitor CBC and CMP 1 month after starting and every 3 months routinely thereafter. Will monitor TB gold annually.  Standing orders placed.  Provided patient with medication education material and answered all questions.  Patient consented to Cosentyx.  Will upload consent into patient's chart.  Will apply for Cosentyx through patient's insurance.  Reviewed storage information for Cosentyx.  Advised initial injection must be administered in office.  Patient voiced understanding.    Patient dose will be for plaque psoriasis +/- psoriatic arthritis 300 mg every 7 days for 5 weeks then 300 mg every 28 days.  Prescription will be sent to pharmacy pending lab results and insurance approval. She signed [employer] PAP forms today in office and will email tax return. PAP forms sent to [employer]  Actemra  infusion that was scheduled for tomorrow has been discontinued and patient is aware. Infusion therapy plan discontinued  Ashtin Melichar, PharmD, MPH, BCPS, CPP Clinical Pharmacist (Rheumatology and Pulmonology)

## 2023-06-25 NOTE — Patient Instructions (Signed)
 Secukinumab Injection What is this medication? SECUKINUMAB (sek ue KIN ue mab) treats autoimmune conditions, such as arthritis and psoriasis. It works by slowing down an overactive immune system.  It may also be used to treat hidradenitis suppurativa (HS). HS is a condition that causes painful lumps under the skin in areas such as the armpits and groin. It is a monoclonal antibody. This medicine may be used for other purposes; ask your health care provider or pharmacist if you have questions. COMMON BRAND NAME(S): Cosentyx What should I tell my care team before I take this medication? They need to know if you have any of these conditions: Crohn's disease, ulcerative colitis, or other inflammatory bowel disease Immune system problems Infection or history of infection, such as a viral infection, chickenpox, cold sores, or herpes Recently received or are scheduled to receive a vaccine Tuberculosis, a positive skin test for tuberculosis, or recent close contact with someone who has tuberculosis An unusual or allergic reaction to secukinumab, latex, rubber, other medications, foods, dyes, or preservatives Pregnant or trying to get pregnant Breast-feeding How should I use this medication? This medication is injected into a vein or under the skin. It is given by your care team in a hospital or clinic setting if it is injected into a vein. If it is injected under the skin, it may be given at home. If you get this medication at home, you will be taught how to prepare and give it. Use it exactly as directed. Take it as directed on the prescription label. Keep taking it unless your care team tells you to stop. It is important that you put your used needles and syringes in a special sharps container. Do not put them in a trash can. If you do not have a sharps container, call your pharmacist or care team to get one. A special MedGuide will be given to you by the pharmacist with each prescription and refill. Be  sure to read this information carefully each time. Talk to your care team about the use of this medication in children. While it may be prescribed for children as young as 2 years for selected conditions, precautions do apply. Overdosage: If you think you have taken too much of this medicine contact a poison control center or emergency room at once. NOTE: This medicine is only for you. Do not share this medicine with others. What if I miss a dose? If you get this medication at the hospital or clinic: It is important not to miss your dose. Call your care team if you are unable to keep an appointment. If you give yourself this medication at home: If you miss a dose, take it as soon as you can. If it is almost time for your next dose, take only that dose. Do not take double or extra doses. Call your care team with questions. What may interact with this medication? Live virus vaccines This list may not describe all possible interactions. Give your health care provider a list of all the medicines, herbs, non-prescription drugs, or dietary supplements you use. Also tell them if you smoke, drink alcohol, or use illegal drugs. Some items may interact with your medicine. What should I watch for while using this medication? Visit your care team for regular checks on your progress. Tell your care team if your symptoms do not start to get better or if they get worse. You will be tested for tuberculosis (TB) before you start this medication. If your care team prescribes  any medication for TB, you should start taking the TB medication before starting this medication. Make sure to finish the full course of TB medication. This medication may increase your risk of getting an infection. Call your care team for advice if you get a fever, chills, sore throat, or other symptoms of a cold or flu. Do not treat yourself. Try to avoid being around people who are sick. This medication can decrease the response to a vaccine. If  you need to get vaccinated, tell your care team if you have received this medication within the last 6 months. Extra booster doses may be needed. Talk to your care team to see if a different vaccination schedule is needed. What side effects may I notice from receiving this medication? Side effects that you should report to your care team as soon as possible: Allergic reactions--skin rash, itching, hives, swelling of the face, lips, tongue, or throat Dry, itchy, scaly patches of skin that blister or peel Infection--fever, chills, cough, sore throat, wounds that don't heal, pain or trouble when passing urine, general feeling of discomfort or being unwell Sudden or severe stomach pain, bloody diarrhea, fever, nausea, vomiting Side effects that usually do not require medical attention (report these to your care team if they continue or are bothersome): Diarrhea Runny or stuffy nose Sore throat This list may not describe all possible side effects. Call your doctor for medical advice about side effects. You may report side effects to FDA at 1-800-FDA-1088. Where should I keep my medication? Keep out of the reach of children and pets. Store in the refrigerator. Do not freeze. Keep it in the original carton until you are ready to use it. Protect from light. Do not shake. Remove the dose from the refrigerator about 30 minutes before it is time for you to use it. Use it within 4 days of removing it from the carton. Get rid of any unused medication after the expiration date. To get rid of medications that are no longer needed or have expired: Take the medication to a medication take-back program. Check with your pharmacy or law enforcement to find a location. If you cannot return the medication, ask your pharmacist or care team how to get rid of this medication safely. NOTE: This sheet is a summary. It may not cover all possible information. If you have questions about this medicine, talk to your doctor,  pharmacist, or health care provider.  2024 Elsevier/Gold Standard (2022-12-13 00:00:00)

## 2023-06-26 ENCOUNTER — Telehealth: Payer: Self-pay

## 2023-06-26 ENCOUNTER — Encounter: Payer: Self-pay | Admitting: Internal Medicine

## 2023-06-26 ENCOUNTER — Ambulatory Visit

## 2023-06-26 DIAGNOSIS — L409 Psoriasis, unspecified: Secondary | ICD-10-CM

## 2023-06-26 DIAGNOSIS — Z79899 Other long term (current) drug therapy: Secondary | ICD-10-CM

## 2023-06-26 LAB — COMPREHENSIVE METABOLIC PANEL WITH GFR
AG Ratio: 2.1 (calc) (ref 1.0–2.5)
ALT: 24 U/L (ref 6–29)
AST: 23 U/L (ref 10–35)
Albumin: 4.7 g/dL (ref 3.6–5.1)
Alkaline phosphatase (APISO): 34 U/L — ABNORMAL LOW (ref 37–153)
BUN: 20 mg/dL (ref 7–25)
CO2: 30 mmol/L (ref 20–32)
Calcium: 10 mg/dL (ref 8.6–10.4)
Chloride: 104 mmol/L (ref 98–110)
Creat: 0.88 mg/dL (ref 0.50–1.03)
Globulin: 2.2 g/dL (ref 1.9–3.7)
Glucose, Bld: 97 mg/dL (ref 65–99)
Potassium: 4.1 mmol/L (ref 3.5–5.3)
Sodium: 142 mmol/L (ref 135–146)
Total Bilirubin: 0.6 mg/dL (ref 0.2–1.2)
Total Protein: 6.9 g/dL (ref 6.1–8.1)
eGFR: 78 mL/min/{1.73_m2} (ref >=60)

## 2023-06-26 LAB — CBC WITH DIFFERENTIAL/PLATELET
Absolute Lymphocytes: 1446 {cells}/uL (ref 850–3900)
Absolute Monocytes: 536 {cells}/uL (ref 200–950)
Basophils Absolute: 21 {cells}/uL (ref 0–200)
Basophils Relative: 0.4 %
Eosinophils Absolute: 57 {cells}/uL (ref 15–500)
Eosinophils Relative: 1.1 %
HCT: 43.2 % (ref 35.0–45.0)
Hemoglobin: 14.5 g/dL (ref 11.7–15.5)
MCH: 32.1 pg (ref 27.0–33.0)
MCHC: 33.6 g/dL (ref 32.0–36.0)
MCV: 95.6 fL (ref 80.0–100.0)
MPV: 11.6 fL (ref 7.5–12.5)
Monocytes Relative: 10.3 %
Neutro Abs: 3141 {cells}/uL (ref 1500–7800)
Neutrophils Relative %: 60.4 %
Platelets: 175 10*3/uL (ref 140–400)
RBC: 4.52 10*6/uL (ref 3.80–5.10)
RDW: 13.4 % (ref 11.0–15.0)
Total Lymphocyte: 27.8 %
WBC: 5.2 10*3/uL (ref 3.8–10.8)

## 2023-06-26 LAB — C-REACTIVE PROTEIN: CRP: <3 mg/L (ref <8.0)

## 2023-06-26 LAB — SEDIMENTATION RATE: Sed Rate: 2 mm/h (ref 0–30)

## 2023-06-26 NOTE — Telephone Encounter (Signed)
-----   Message from [facility] sent at 06/25/2023 11:23 AM EDT ----- Pending OV note from today, please start SQ Cosentyx BIV Patient dose will be for plaque psoriasis +/- psoriatic arthritis 300 mg every 7 days for 5 weeks then 300 mg every 28 days.  Prescription will be sent to pharmacy pending lab results and insurance approval. She signed [employer] PAP forms today in office and will email tax return.

## 2023-06-27 NOTE — Telephone Encounter (Signed)
 I spoke with Ms. Burpee, xray findings show advanced right hip osteoarthritis and mild to moderate changes in the left hip. There is also some lateral calcification that could indicate a problem such as chronic labral tear. I think she will need to see orthopedic surgery for the right hip considering injection, weight may limit surgical options at this time. I do not think she needs to taper medication, can start Cosentyx once due for next infusion dose.

## 2023-06-30 ENCOUNTER — Other Ambulatory Visit (HOSPITAL_COMMUNITY): Payer: Self-pay

## 2023-06-30 NOTE — Telephone Encounter (Signed)
 Received income documents from patient via email  Submitted Patient Assistance Application to [employer] for COSENTYX SQ along with provider portion, patient portion, PA, medication list, insurance card copy and income documents. Will update patient when we receive a response.  Phone: [phone] Fax: [phone]  Geraldene Kleine, PharmD, MPH, BCPS, CPP Clinical Pharmacist (Rheumatology and Pulmonology)

## 2023-06-30 NOTE — Telephone Encounter (Signed)
 Received notification from [facility] regarding a prior authorization for COSENTYX SQ. Authorization has been APPROVED from 06/30/2023 to 06/29/2024. Approval letter sent to scan center.  Per test claim, copay for 28 days supply is $1366.76 (loading dose)  Patient can fill through [facility]: [phone]   Authorization # [id number] Phone # [phone]  Will need 2024 tax return for [employer] PAP submission. MyChart message sent to patient as reminder. She had wanted to email forms to me per discussion at OV  Geraldene Kleine, PharmD, MPH, BCPS, CPP Clinical Pharmacist (Rheumatology and Pulmonology)

## 2023-06-30 NOTE — Telephone Encounter (Addendum)
 Submitted an expedited Prior Authorization request to [facility] for COSENTYX SQ via CoverMyMeds. Will update once we receive a response.  Key: Ardella Beaver

## 2023-06-30 NOTE — Telephone Encounter (Signed)
 Tonya from [facility] called the office to advise that the PA was approved for the COSENTYX SQ and that she would be faxing the information over to the office.

## 2023-07-01 ENCOUNTER — Other Ambulatory Visit: Payer: Self-pay | Admitting: Family Medicine

## 2023-07-01 DIAGNOSIS — E669 Obesity, unspecified: Secondary | ICD-10-CM

## 2023-07-01 DIAGNOSIS — G629 Polyneuropathy, unspecified: Secondary | ICD-10-CM

## 2023-07-01 DIAGNOSIS — I10 Essential (primary) hypertension: Secondary | ICD-10-CM

## 2023-07-02 MED ORDER — COSENTYX UNOREADY 300 MG/2ML ~~LOC~~ SOAJ
300.0000 mg | SUBCUTANEOUS | Status: DC
Start: 2023-07-02 — End: 2023-09-08

## 2023-07-02 MED ORDER — COSENTYX UNOREADY 300 MG/2ML ~~LOC~~ SOAJ: 300.0000 mg | SUBCUTANEOUS | 0 refills | Status: DC

## 2023-07-02 NOTE — Addendum Note (Signed)
 Addended by: Thais Fill on: 07/02/2023 09:39 AM   Modules accepted: Orders

## 2023-07-02 NOTE — Telephone Encounter (Signed)
 Received a fax from  [employer] regarding an approval for COSENTYX SQ patient assistance from 07/01/2023 to 01/14/2024. Approval letter sent to scan center.  Phone: [phone] Fax: [phone]  Called patient to notify. Provided her with phone number for [employer] PAP and advised that she call company at the end of the week if she has not heard from company. She verbalized understanding. Nothing further needed at this  Geraldene Kleine, PharmD, MPH, BCPS, CPP Clinical Pharmacist (Rheumatology and Pulmonology)

## 2023-07-09 ENCOUNTER — Encounter: Payer: Self-pay | Admitting: Sports Medicine

## 2023-07-09 ENCOUNTER — Ambulatory Visit: Admitting: Sports Medicine

## 2023-07-09 DIAGNOSIS — E66813 Obesity, class 3: Secondary | ICD-10-CM

## 2023-07-09 DIAGNOSIS — G8929 Other chronic pain: Secondary | ICD-10-CM

## 2023-07-09 DIAGNOSIS — M25552 Pain in left hip: Secondary | ICD-10-CM

## 2023-07-09 DIAGNOSIS — M160 Bilateral primary osteoarthritis of hip: Secondary | ICD-10-CM

## 2023-07-09 DIAGNOSIS — M170 Bilateral primary osteoarthritis of knee: Secondary | ICD-10-CM

## 2023-07-09 DIAGNOSIS — Z6841 Body Mass Index (BMI) 40.0 and over, adult: Secondary | ICD-10-CM

## 2023-07-09 DIAGNOSIS — M25551 Pain in right hip: Secondary | ICD-10-CM

## 2023-07-09 MED ORDER — MELOXICAM 15 MG PO TABS: 15.0000 mg | ORAL_TABLET | Freq: Every day | ORAL | 1 refills | Status: DC

## 2023-07-09 NOTE — Progress Notes (Signed)
 Elizabeth Patrick - 53 y.o. female MRN [id number]  Date of birth: 04-21-1970  Office Visit Note: Visit Date: 07/09/2023 PCP: Chandra Toribio POUR, MD Referred by: Jeannetta Lonni ORN, MD  Subjective: Chief Complaint  Patient presents with   Right Hip - Pain   HPI: Elizabeth Patrick is a pleasant 53 y.o. female who presents today for acute on chronic bilateral hip pain.  Anzal has had bilateral hip pain in the past although her right hip has been the most bothersome and has been present for greater than 1 year.  Here over the last 3 months or so her pain has progressed and she is having increasing pain into the groin of the hip. She has seen a new rheumatologist, seeing Dr. Jeannetta, who discussed she does not have rheumatoid arthritis but a case of psoriatic arthritis.  He did obtain hip x-rays and recommended orthopedic evaluation here. She has tried oral Voltaren, Celebrex and other NSAIDs in the past without significant improvement.  She does have known severe bilateral knee osteoarthritis.  She is trying to lose weight and has lost over 75 pounds in the past.  When she saw Dr. Jerri in the past, he reported her weight goal would be approximately 230 pounds before elective joint replacement.  She currently is on Ozempic .  She is managed on Lyrica 225 mg 3 times a day, does follow with pain management.  She is planning to begin Cosentynx with Rheumatology.  BMI: 59.7 (5'4 and 348 lbs)  Lab Results  Component Value Date   HGBA1C 5.2 12/16/2022   Pertinent ROS were reviewed with the patient and found to be negative unless otherwise specified above in HPI.   Assessment & Plan: Visit Diagnoses:  1. Bilateral primary osteoarthritis of hip   2. Chronic hip pain, bilateral   3. Class 3 severe obesity with body mass index (BMI) of 50.0 to 59.9 in adult, unspecified obesity type, unspecified whether serious comorbidity present   4. Primary osteoarthritis of both knees    Plan: Impression is acute  exacerbation of chronic bilateral hip osteoarthritis with right hip being symptomatic and severe in nature.  Her pain is limiting her physical activity and even her ADLs.  She does have bilateral severe osteoarthritis as well.  She is working on weight loss and is managed on Ozempic , but finds dedicated physical therapy worsens her symptoms.  We will get her started in aquatic-based physical therapy for her hips and knees.  She will work with her primary physician on other weight loss strategies, she is open to considering gastric sleeve or banding.  She will continue her Lyrica 225 mg 3 times daily.  We will start a trial of meloxicam 15 mg which she will take for the next 2-3 weeks and then may transition to as needed.  For her acute pain, we will bring her back next week for an ultrasound-guided right intra-articular hip injection.   Follow-up: Return for Schedule for US -guided R-hip inj (30-min) next available.   Meds & Orders: No orders of the defined types were placed in this encounter.  No orders of the defined types were placed in this encounter.    Procedures: No procedures performed      Clinical History: No specialty comments available.  She reports that she quit smoking about 18 years ago. Her smoking use included cigarettes. She started smoking about 28 years ago. She has a 10 pack-year smoking history. She has been exposed to tobacco smoke. She has  never used smokeless tobacco.  Recent Labs    09/06/22 1102 12/16/22 1442  HGBA1C 4.8 5.2    Objective:   Physical Exam  Gen: Well-appearing, in no acute distress; non-toxic CV: Well-perfused. Warm.  Resp: Breathing unlabored on room air; no wheezing. Psych: Fluid speech in conversation; appropriate affect; normal thought process  Ortho Exam - Bilateral hips: Mild right sided greater trochanteric TTP.  There is both restriction and associated pain with internal rotation of the right hip, positive FADIR. There is no  significant bony restriction of the contralateral left hip and a negative FADIR on exam.  Imaging:  *3 view bilateral hip x-rays of the right and left hip from 06/25/2023 was reviewed by myself today.  X-rays demonstrate severe joint space narrowing and osteoarthritic change of the right hip with notable subchondral bony sclerosis as well as osteophytosis over the superior lateral joint.  There is right greater than left SI joint arthritic change as well.  The left hip shows at least moderate osteoarthritis with degenerative spurring over the inferior aspect of the femoral head and neck juncture.  Past Medical/Family/Surgical/Social History: Medications & Allergies reviewed per EMR, new medications updated. 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   Vitamin B12 deficiency 07/23/2021   Severe sleep apnea 07/04/2021   Psoriasis and similar disorder 05/27/2021   Depression, major, single episode, moderate (HCC) 04/29/2021   Loud snoring 04/17/2021   Menopause 04/16/2021   Morbid obesity (HCC) 04/05/2021   Osteoarthritis of both knees 04/04/2021   Type 2 diabetes mellitus without complication, with long-term current use of insulin  (HCC) 11/28/2020   Gastroesophageal reflux disease 11/28/2020   Recurrent UTI 10/17/2020   Hyperlipidemia associated with type 2 diabetes mellitus (HCC) 10/11/2020   Bilateral lower extremity edema 10/11/2020   Diabetic polyneuropathy associated with type 2 diabetes mellitus (HCC) 10/11/2020   NASH (nonalcoholic steatohepatitis) 10/10/2020   Rheumatoid arthritis (HCC) 09/26/2020   Hypertension  associated with diabetes (HCC) 09/26/2020   Osteoarthritis 09/26/2020   Past Medical History:  Diagnosis Date   Abnormal uterine bleeding    Anemia    Blood transfusion without reported diagnosis    Complication of anesthesia    can wake up during procedure   Diabetes mellitus without complication (HCC)    Dysmenorrhea    Endometriosis    Fibroid    Genital warts    GERD (gastroesophageal reflux disease)    Hypertension    PONV (postoperative nausea and vomiting)    RA (rheumatoid arthritis) (HCC)    Radiculopathy    Family History  Problem Relation Age of Onset   Obesity Mother    Coronary artery disease Mother    Bipolar disorder Mother    Multiple sclerosis Mother    Hypertension Father    Heart attack Father    Vascular Disease Father    Breast cancer Maternal Grandmother 6   Past Surgical History:  Procedure Laterality Date   ABDOMINAL HYSTERECTOMY     CARPOMETACARPEL SUSPENSION PLASTY Left 02/13/2023   Procedure: Left trapeziectomy with ligament reconstruction and tendon interposition;  Surgeon: Romona Harari, MD;  Location: [facility] OR;  Service: Orthopedics;  Laterality: Left;  regional, he can start earlier if need be to fit   CLOSED REDUCTION FINGER WITH PERCUTANEOUS PINNING Left 02/13/2023   Procedure: possible percutaneous pinning of thumb MCP join;  Surgeon: Romona Harari, MD;  Location: [facility] OR;  Service: Orthopedics;  Laterality: Left;  regional he can start earlier if need be to fit   COLONOSCOPY WITH PROPOFOL  N/A 06/26/2022   Procedure: COLONOSCOPY WITH PROPOFOL ;  Surgeon: Therisa Bi, MD;  Location: [facility] ENDOSCOPY;  Service: Gastroenterology;  Laterality: N/A;  Patient informed the office that she wakes from anesthesia combative and pulliing at tubes.  She wanted us  to be aware.   ESOPHAGOGASTRODUODENOSCOPY (EGD) WITH PROPOFOL  N/A 06/26/2022   Procedure: ESOPHAGOGASTRODUODENOSCOPY (EGD) WITH PROPOFOL ;  Surgeon: Therisa Bi, MD;  Location: [facility] ENDOSCOPY;   Service: Gastroenterology;  Laterality: N/A;   PARATHYROIDECTOMY     Social History   Occupational History   Not on file  Tobacco Use   Smoking status: Former    Current packs/day: 0.00    Average packs/day: 1 pack/day for 10.0 years (10.0 ttl pk-yrs)    Types: Cigarettes    Start date: 01/15/1995    Quit date: 01/14/2005    Years since quitting: 18.4    Passive exposure: Past   Smokeless tobacco: Never  Vaping Use   Vaping status: Never Used  Substance and Sexual Activity   Alcohol use: Not Currently   Drug use: Never   Sexual activity: Yes    Partners: Male    Birth control/protection: Surgical    Comment: Hysterectomy; First IC @ 13y/o, Partners >5, DES exposure-unknown

## 2023-07-10 ENCOUNTER — Ambulatory Visit

## 2023-07-10 DIAGNOSIS — Z0000 Encounter for general adult medical examination without abnormal findings: Secondary | ICD-10-CM | POA: Diagnosis not present

## 2023-07-10 NOTE — Progress Notes (Signed)
 Subjective:   Elizabeth Patrick is a 53 y.o. who presents for a Medicare Wellness preventive visit.  As a reminder, Annual Wellness Visits don't include a physical exam, and some assessments may be limited, especially if this visit is performed virtually. We may recommend an in-person follow-up visit with your provider if needed.  Visit Complete: Virtual I connected with  Elizabeth Patrick on 07/10/23 by a audio enabled telemedicine application and verified that I am speaking with the correct person using two identifiers.  Patient Location: Home  Provider Location: Home Office  I discussed the limitations of evaluation and management by telemedicine. The patient expressed understanding and agreed to proceed.  Vital Signs: Because this visit was a virtual/telehealth visit, some criteria may be missing or patient reported. Any vitals not documented were not able to be obtained and vitals that have been documented are patient reported.  VideoError- Librarian, academic were attempted between this provider and patient, however failed, due to patient having technical difficulties OR patient did not have access to video capability.  We continued and completed visit with audio only.   Persons Participating in Visit: Patient.  AWV Questionnaire: No: Patient Medicare AWV questionnaire was not completed prior to this visit.  Cardiac Risk Factors include: advanced age (>34men, >101 women);diabetes mellitus;dyslipidemia;hypertension     Objective:    Today's Vitals   07/10/23 1007  PainSc: 5    There is no height or weight on file to calculate BMI.     07/10/2023   10:19 AM 02/13/2023   10:27 AM 06/26/2022    7:02 AM 06/06/2022    1:36 PM 05/07/2022   11:11 AM 07/03/2021    4:08 PM 05/10/2021    2:17 PM  Advanced Directives  Does Patient Have a Medical Advance Directive? No No No No No No No  Would patient like information on creating a medical advance directive? No  - Patient declined No - Patient declined   No - Patient declined No - Patient declined Yes (Inpatient - patient defers creating a medical advance directive at this time - Information given)    Current Medications (verified) Outpatient Encounter Medications as of 07/10/2023  Medication Sig   acetaminophen  (TYLENOL ) 325 MG tablet Take 975 mg by mouth every 6 (six) hours as needed for moderate pain (pain score 4-6).   amLODipine  (NORVASC ) 10 MG tablet TAKE 1 TABLET BY MOUTH DAILY   atorvastatin  (LIPITOR) 10 MG tablet TAKE 1 TABLET(10 MG) BY MOUTH DAILY   clobetasol  cream (TEMOVATE ) 0.05 % Apply 1 application. topically 2 (two) times daily.   Cranberry 1000 MG CAPS Take 1,000 mg by mouth daily.   Cyanocobalamin  (B-12 PO) Take 1 capsule by mouth daily.   esomeprazole  (NEXIUM ) 40 MG capsule Take 1 capsule (40 mg total) by mouth daily.   estradiol  (VIVELLE -DOT) 0.0375 MG/24HR Place 1 patch onto the skin 2 (two) times a week.   fenofibrate  (TRICOR ) 145 MG tablet TAKE 1 TABLET BY MOUTH EVERY DAY WITH DINNER   hydrochlorothiazide  (HYDRODIURIL ) 12.5 MG tablet Take 1 tablet (12.5 mg total) by mouth daily.   HYDROcodone-acetaminophen  (NORCO/VICODIN) 5-325 MG tablet Take 1 tablet by mouth every 8 (eight) hours as needed.   Lidocaine  (BLUE-EMU PAIN RELIEF DRY EX) Apply 1 Application topically daily as needed (pain).   losartan  (COZAAR ) 100 MG tablet TAKE 1 TABLET(100 MG) BY MOUTH DAILY   meloxicam (MOBIC) 15 MG tablet Take 1 tablet (15 mg total) by mouth daily.   metoprolol   succinate (TOPROL -XL) 25 MG 24 hr tablet Take 1 tablet (25 mg total) by mouth daily.   Multiple Vitamin (MULTIVITAMIN PO) Take by mouth.   mupirocin  ointment (BACTROBAN ) 2 % Apply 1 Application topically 2 (two) times daily.   pregabalin (LYRICA) 225 MG capsule Take 225 mg by mouth 3 (three) times daily.   progesterone  (PROMETRIUM ) 100 MG capsule Take 1 capsule (100 mg total) by mouth daily.   tiZANidine (ZANAFLEX) 2 MG tablet Take 2 mg  by mouth every 6 (six) hours as needed for muscle spasms.   benzonatate  (TESSALON ) 200 MG capsule Take 1 capsule (200 mg total) by mouth 2 (two) times daily as needed for cough. (Patient not taking: Reported on 07/10/2023)   Blood Pressure Monitor DEVI 1 Device by Does not apply route daily. (Patient not taking: Reported on 07/10/2023)   clotrimazole  (LOTRIMIN ) 1 % cream Apply 1 Application topically 2 (two) times daily. (Patient not taking: Reported on 07/10/2023)   nystatin  cream (MYCOSTATIN ) Apply 1 Application topically 2 (two) times daily. (Patient not taking: Reported on 07/10/2023)   Secukinumab (COSENTYX UNOREADY) 300 MG/2ML SOAJ Inject 300 mg into the skin once a week. at Weeks 0, 1, 2, 3 (Patient not taking: Reported on 07/10/2023)   Secukinumab (COSENTYX UNOREADY) 300 MG/2ML SOAJ Inject 300 mg into the skin every 28 (twenty-eight) days. **ALL REFILLS NEED TO BE ESCRIBED to [facility]** (Patient not taking: Reported on 07/10/2023)   No facility-administered encounter medications on file as of 07/10/2023.    Allergies (verified) Alcohol, Dilantin [phenytoin], and Methotrexate   History: Past Medical History:  Diagnosis Date   Abnormal uterine bleeding    Anemia    Blood transfusion without reported diagnosis    Complication of anesthesia    can wake up during procedure   Diabetes mellitus without complication (HCC)    Dysmenorrhea    Endometriosis    Fibroid    Genital warts    GERD (gastroesophageal reflux disease)    Hypertension    PONV (postoperative nausea and vomiting)    RA (rheumatoid arthritis) (HCC)    Radiculopathy    Past Surgical History:  Procedure Laterality Date   ABDOMINAL HYSTERECTOMY     CARPOMETACARPEL SUSPENSION PLASTY Left 02/13/2023   Procedure: Left trapeziectomy with ligament reconstruction and tendon interposition;  Surgeon: Romona Harari, MD;  Location: [facility] OR;  Service: Orthopedics;  Laterality: Left;  regional, he can start earlier if  need be to fit   CLOSED REDUCTION FINGER WITH PERCUTANEOUS PINNING Left 02/13/2023   Procedure: possible percutaneous pinning of thumb MCP join;  Surgeon: Romona Harari, MD;  Location: [facility] OR;  Service: Orthopedics;  Laterality: Left;  regional he can start earlier if need be to fit   COLONOSCOPY WITH PROPOFOL  N/A 06/26/2022   Procedure: COLONOSCOPY WITH PROPOFOL ;  Surgeon: Therisa Bi, MD;  Location: [facility] ENDOSCOPY;  Service: Gastroenterology;  Laterality: N/A;  Patient informed the office that she wakes from anesthesia combative and pulliing at tubes.  She wanted us  to be aware.   ESOPHAGOGASTRODUODENOSCOPY (EGD) WITH PROPOFOL  N/A 06/26/2022   Procedure: ESOPHAGOGASTRODUODENOSCOPY (EGD) WITH PROPOFOL ;  Surgeon: Therisa Bi, MD;  Location: [facility] ENDOSCOPY;  Service: Gastroenterology;  Laterality: N/A;   PARATHYROIDECTOMY     Family History  Problem Relation Age of Onset   Obesity Mother    Coronary artery disease Mother    Bipolar disorder Mother    Multiple sclerosis Mother    Hypertension Father    Heart attack Father  Vascular Disease Father    Breast cancer Maternal Grandmother 27   Social History   Socioeconomic History   Marital status: Married    Spouse name: Not on file   Number of children: Not on file   Years of education: Not on file   Highest education level: Not on file  Occupational History   Not on file  Tobacco Use   Smoking status: Former    Current packs/day: 0.00    Average packs/day: 1 pack/day for 10.0 years (10.0 ttl pk-yrs)    Types: Cigarettes    Start date: 01/15/1995    Quit date: 01/14/2005    Years since quitting: 18.4    Passive exposure: Past   Smokeless tobacco: Never  Vaping Use   Vaping status: Never Used  Substance and Sexual Activity   Alcohol use: Not Currently   Drug use: Yes    Types: Hydrocodone   Sexual activity: Yes    Partners: Male    Birth control/protection: Surgical    Comment: Hysterectomy; First IC @ 13y/o, Partners >5,  DES exposure-unknown  Other Topics Concern   Not on file  Social History Narrative   Not on file   Social Drivers of Health   Financial Resource Strain: Low Risk  (07/10/2023)   Overall Financial Resource Strain (CARDIA)    Difficulty of Paying Living Expenses: Not hard at all  Food Insecurity: No Food Insecurity (07/10/2023)   Hunger Vital Sign    Worried About Running Out of Food in the Last Year: Never true    Ran Out of Food in the Last Year: Never true  Transportation Needs: No Transportation Needs (07/10/2023)   PRAPARE - Administrator, Civil Service (Medical): No    Lack of Transportation (Non-Medical): No  Physical Activity: Sufficiently Active (07/10/2023)   Exercise Vital Sign    Days of Exercise per Week: 3 days    Minutes of Exercise per Session: 60 min  Stress: No Stress Concern Present (07/10/2023)   [employer] of Occupational Health - Occupational Stress Questionnaire    Feeling of Stress: Not at all  Social Connections: Moderately Integrated (07/10/2023)   Social Connection and Isolation Panel    Frequency of Communication with Friends and Family: More than three times a week    Frequency of Social Gatherings with Friends and Family: Not on file    Attends Religious Services: Never    Database administrator or Organizations: Yes    Attends Engineer, structural: More than 4 times per year    Marital Status: Married    Tobacco Counseling Counseling given: Not Answered    Clinical Intake:  Pre-visit preparation completed: Yes  Pain : 0-10 Pain Score: 5  Pain Type: Chronic pain Pain Location: Hip (left knee) Pain Orientation: Right Pain Descriptors / Indicators: Aching Pain Onset: More than a month ago Pain Frequency: Constant     Nutritional Risks: None Diabetes: Yes CBG done?: No Did pt. bring in CBG monitor from home?: No  Lab Results  Component Value Date   HGBA1C 5.2 12/16/2022   HGBA1C 4.8 09/06/2022   HGBA1C 5.1  06/06/2022     How often do you need to have someone help you when you read instructions, pamphlets, or other written materials from your doctor or pharmacy?: 1 - Never  Interpreter Needed?: No  Information entered by :: NAllen LPN   Activities of Daily Living     07/10/2023   10:09 AM 02/13/2023  10:37 AM  In your present state of health, do you have any difficulty performing the following activities:  Hearing? 0   Vision? 0   Difficulty concentrating or making decisions? 1   Comment sometimes   Walking or climbing stairs? 1   Dressing or bathing? 0   Doing errands, shopping? 0 0  Preparing Food and eating ? N   Using the Toilet? N   In the past six months, have you accidently leaked urine? Y   Do you have problems with loss of bowel control? N   Managing your Medications? N   Managing your Finances? N   Housekeeping or managing your Housekeeping? Y     Patient Care Team: Chandra Toribio POUR, MD as PCP - General (Family Medicine) Cathlyn JAYSON Nikki Bobie FORBES, MD as Consulting Physician (Obstetrics and Gynecology) [facility] [place] Burnetta Brunet, [place] as Consulting Physician (Sports Medicine)  I have updated your Care Teams any recent Medical Services you may have received from other providers in the past year.     Assessment:   This is a routine wellness examination for Elizabeth Patrick.  Hearing/Vision screen Hearing Screening - Comments:: Denies hearing issues Vision Screening - Comments:: Regular eye exams, [facility]   Goals Addressed             This Visit's Progress    Patient Stated       07/10/2023, wants to lose 120 pounds       Depression Screen     07/10/2023   10:23 AM 06/17/2023   11:30 AM 03/17/2023    9:40 AM 12/16/2022    1:57 PM 10/28/2022    1:16 PM 09/06/2022   10:05 AM 06/06/2022    1:37 PM  PHQ 2/9 Scores  PHQ - 2 Score 0 1 4 0 0 1 0  PHQ- 9 Score 6 7 14 5 2 6 2     Fall Risk     07/10/2023   10:22 AM 06/17/2023   11:31 AM  06/06/2022    1:37 PM 05/07/2022   11:11 AM 03/01/2022   11:34 AM  Fall Risk   Falls in the past year? 1 1 1 1 1   Number falls in past yr: 1 1 0 0 1  Injury with Fall? 0 0 0 0 0  Risk for fall due to : Impaired balance/gait;Impaired mobility;History of fall(s);Medication side effect Other (Comment) History of fall(s) No Fall Risks   Follow up Falls evaluation completed;Falls prevention discussed Falls evaluation completed Falls evaluation completed Falls prevention discussed     MEDICARE RISK AT HOME:  Medicare Risk at Home Any stairs in or around the home?: No If so, are there any without handrails?: No Home free of loose throw rugs in walkways, pet beds, electrical cords, etc?: Yes Adequate lighting in your home to reduce risk of falls?: Yes Life alert?: No Use of a cane, walker or w/c?: Yes Grab bars in the bathroom?: Yes Shower chair or bench in shower?: Yes Elevated toilet seat or a handicapped toilet?: No  TIMED UP AND GO:  Was the test performed?  No  Cognitive Function: 6CIT completed        07/10/2023   10:24 AM 05/07/2022   11:12 AM  6CIT Screen  What Year? 0 points 0 points  What month? 0 points 0 points  What time? 0 points 0 points  Count back from 20 0 points 0 points  Months in reverse 2 points 0 points  Repeat  phrase 0 points 0 points  Total Score 2 points 0 points    Immunizations Immunization History  Administered Date(s) Administered   Hep A / Hep B 08/13/2021   Influenza, Seasonal, Injecte, Preservative Fre 10/15/2022   Influenza,inj,Quad PF,6+ Mos 11/01/2014, 11/23/2020, 10/11/2021   Moderna Sars-Covid-2 Vaccination 05/31/2019, 06/28/2019, 01/14/2020   Pneumococcal Conjugate-13 04/28/2014    Screening Tests Health Maintenance  Topic Date Due   DTaP/Tdap/Td (1 - Tdap) Never done   Zoster Vaccines- Shingrix (1 of 2) Never done   Pneumococcal Vaccine 72-61 Years old (2 of 2 - PPSV23, PCV20, or PCV21) 06/23/2014   Hepatitis B Vaccines (2 of 3 -  19+ 3-dose series) 09/10/2021   COVID-19 Vaccine (4 - 2024-25 season) 09/15/2022   HEMOGLOBIN A1C  06/16/2023   INFLUENZA VACCINE  08/15/2023   Diabetic kidney evaluation - Urine ACR  09/06/2023   FOOT EXAM  12/16/2023   OPHTHALMOLOGY EXAM  05/06/2024   Diabetic kidney evaluation - eGFR measurement  06/24/2024   Medicare Annual Wellness (AWV)  07/09/2024   MAMMOGRAM  10/03/2024   Cervical Cancer Screening (HPV/Pap Cotest)  05/23/2026   Colonoscopy  06/25/2032   Hepatitis C Screening  Completed   HIV Screening  Completed   HPV VACCINES  Aged Out   Meningococcal B Vaccine  Aged Out    Health Maintenance  Health Maintenance Due  Topic Date Due   DTaP/Tdap/Td (1 - Tdap) Never done   Zoster Vaccines- Shingrix (1 of 2) Never done   Pneumococcal Vaccine 36-40 Years old (2 of 2 - PPSV23, PCV20, or PCV21) 06/23/2014   Hepatitis B Vaccines (2 of 3 - 19+ 3-dose series) 09/10/2021   COVID-19 Vaccine (4 - 2024-25 season) 09/15/2022   HEMOGLOBIN A1C  06/16/2023   Health Maintenance Items Addressed: Due for TDAP. States has pneumonia vaccine in [place].A1C to be addressed at appointment in September.  Additional Screening:  Vision Screening: Recommended annual ophthalmology exams for early detection of glaucoma and other disorders of the eye. Would you like a referral to an eye doctor? No    Dental Screening: Recommended annual dental exams for proper oral hygiene  Community Resource Referral / Chronic Care Management: CRR required this visit?  No   CCM required this visit?  No   Plan:    I have personally reviewed and noted the following in the patient's chart:   Medical and social history Use of alcohol, tobacco or illicit drugs  Current medications and supplements including opioid prescriptions. Patient is currently taking opioid prescriptions. Information provided to patient regarding non-opioid alternatives. Patient advised to discuss non-opioid treatment plan with their  provider. Functional ability and status Nutritional status Physical activity Advanced directives List of other physicians Hospitalizations, surgeries, and ER visits in previous 12 months Vitals Screenings to include cognitive, depression, and falls Referrals and appointments  In addition, I have reviewed and discussed with patient certain preventive protocols, quality metrics, and best practice recommendations. A written personalized care plan for preventive services as well as general preventive health recommendations were provided to patient.   Elizabeth FORBES Dawn, LPN   [id number]   After Visit Summary: (MyChart) Due to this being a telephonic visit, the after visit summary with patients personalized plan was offered to patient via MyChart   Notes: Nothing significant to report at this time.

## 2023-07-10 NOTE — Patient Instructions (Signed)
 Elizabeth Patrick , Thank you for taking time out of your busy schedule to complete your Annual Wellness Visit with me. I enjoyed our conversation and look forward to speaking with you again next year. I, as well as your care team,  appreciate your ongoing commitment to your health goals. Please review the following plan we discussed and let me know if I can assist you in the future. Your Game plan/ To Do List    Referrals: If you haven't heard from the office you've been referred to, please reach out to them at the phone provided.  N/a Follow up Visits: Next Medicare AWV with our clinical staff: 08/19/2024 at 10:10   Have you seen your provider in the last 6 months (3 months if uncontrolled diabetes)? Yes Next Office Visit with your provider: 09/23/2023 at 9:30  Clinician Recommendations:  Aim for 30 minutes of exercise or brisk walking, 6-8 glasses of water, and 5 servings of fruits and vegetables each day.       This is a list of the screening recommended for you and due dates:  Health Maintenance  Topic Date Due   DTaP/Tdap/Td vaccine (1 - Tdap) Never done   Zoster (Shingles) Vaccine (1 of 2) Never done   Pneumococcal Vaccination (2 of 2 - PPSV23, PCV20, or PCV21) 06/23/2014   Hepatitis B Vaccine (2 of 3 - 19+ 3-dose series) 09/10/2021   COVID-19 Vaccine (4 - 2024-25 season) 09/15/2022   Hemoglobin A1C  06/16/2023   Flu Shot  08/15/2023   Yearly kidney health urinalysis for diabetes  09/06/2023   Complete foot exam   12/16/2023   Eye exam for diabetics  05/06/2024   Yearly kidney function blood test for diabetes  06/24/2024   Medicare Annual Wellness Visit  07/09/2024   Mammogram  10/03/2024   Pap with HPV screening  05/23/2026   Colon Cancer Screening  06/25/2032   Hepatitis C Screening  Completed   HIV Screening  Completed   HPV Vaccine  Aged Out   Meningitis B Vaccine  Aged Out    Advanced directives: (ACP Link)Information on Advanced Care Planning can be found at Scientist, product/process development Advance Health Care Directives Advance Health Care Directives. [email or web address].[email or web address]/  Advance Care Planning is important because it:  [x]  Makes sure you receive the medical care that is consistent with your values, goals, and preferences  [x]  It provides guidance to your family and loved ones and reduces their decisional burden about whether or not they are making the right decisions based on your wishes.  Follow the link provided in your after visit summary or read over the paperwork we have mailed to you to help you started getting your Advance Directives in place. If you need assistance in completing these, please reach out to us  so that we can help you!  See attachments for Preventive Care and Fall Prevention Tips.

## 2023-07-11 ENCOUNTER — Encounter: Payer: Self-pay | Admitting: Family Medicine

## 2023-07-14 ENCOUNTER — Encounter: Payer: Self-pay | Admitting: Internal Medicine

## 2023-07-14 ENCOUNTER — Other Ambulatory Visit: Payer: Self-pay | Admitting: Family Medicine

## 2023-07-15 DIAGNOSIS — E109 Type 1 diabetes mellitus without complications: Secondary | ICD-10-CM | POA: Diagnosis not present

## 2023-07-16 ENCOUNTER — Other Ambulatory Visit: Payer: Self-pay

## 2023-07-16 ENCOUNTER — Ambulatory Visit: Admitting: Sports Medicine

## 2023-07-16 ENCOUNTER — Encounter: Payer: Self-pay | Admitting: Sports Medicine

## 2023-07-16 DIAGNOSIS — M1611 Unilateral primary osteoarthritis, right hip: Secondary | ICD-10-CM | POA: Diagnosis not present

## 2023-07-16 DIAGNOSIS — M25551 Pain in right hip: Secondary | ICD-10-CM

## 2023-07-16 MED ORDER — METHYLPREDNISOLONE ACETATE 40 MG/ML IJ SUSP
80.0000 mg | INTRAMUSCULAR | Status: AC | PRN
  Administered 2023-07-16: 80 mg via INTRA_ARTICULAR

## 2023-07-16 MED ORDER — LIDOCAINE HCL 1 % IJ SOLN
4.0000 mL | INTRAMUSCULAR | Status: AC | PRN
  Administered 2023-07-16: 4 mL

## 2023-07-16 NOTE — Progress Notes (Signed)
   Procedure Note  Patient: Elizabeth Patrick             Date of Birth: 04/17/1970           MRN: [id number]             Visit Date: 07/16/2023  Procedures: Visit Diagnoses: No diagnosis found.  Large Joint Inj: R hip joint on 07/16/2023 10:09 AM Indications: pain Details: 22 G 3.5 in needle, ultrasound-guided anterior approach Medications: 4 mL lidocaine  1 %; 80 mg methylPREDNISolone  acetate 40 MG/ML Outcome: tolerated well, no immediate complications  Procedure: US -guided intra-articular hip injection, Right After discussion on risks/benefits/indications and informed verbal consent was obtained, a timeout was performed. Patient was lying supine on exam table. The hip was cleaned with betadine and alcohol swabs. Then utilizing ultrasound guidance, the patient's femoral head and neck junction was identified and subsequently injected with 4:2 lidocaine :depomedrol via an in-plane approach with ultrasound visualization of the injectate administered into the hip joint. Patient tolerated procedure well without immediate complications.  Procedure, treatment alternatives, risks and benefits explained, specific risks discussed. Consent was given by the patient. Immediately prior to procedure a time out was called to verify the correct patient, procedure, equipment, support staff and site/side marked as required. Patient was prepped and draped in the usual sterile fashion.     - patient tolerated procedure well, discussed post-injection protocol - follow-up with me as needed --> discussed protocol for infrequent injections as needed > 3 months apart - She is making an appointment with a gastric physician to discuss banding, gastric bypass for weight loss management  Lonell Sprang, DO Primary Care Sports Medicine Physician  [facility] - Orthopedics  This note was dictated using Dragon naturally speaking software and may contain errors in syntax, spelling, or content which have not been  identified prior to signing this note.

## 2023-07-27 ENCOUNTER — Encounter: Payer: Self-pay | Admitting: Family Medicine

## 2023-07-29 ENCOUNTER — Other Ambulatory Visit: Payer: Self-pay | Admitting: Family Medicine

## 2023-07-29 DIAGNOSIS — M5137 Other intervertebral disc degeneration, lumbosacral region with discogenic back pain only: Secondary | ICD-10-CM

## 2023-08-03 ENCOUNTER — Encounter: Payer: Self-pay | Admitting: Internal Medicine

## 2023-08-04 ENCOUNTER — Encounter: Payer: Self-pay | Admitting: Family Medicine

## 2023-08-06 ENCOUNTER — Other Ambulatory Visit: Payer: Self-pay | Admitting: Family Medicine

## 2023-08-06 DIAGNOSIS — M170 Bilateral primary osteoarthritis of knee: Secondary | ICD-10-CM | POA: Diagnosis not present

## 2023-08-15 DIAGNOSIS — E109 Type 1 diabetes mellitus without complications: Secondary | ICD-10-CM | POA: Diagnosis not present

## 2023-08-21 ENCOUNTER — Other Ambulatory Visit: Payer: Self-pay | Admitting: Family Medicine

## 2023-08-21 DIAGNOSIS — E669 Obesity, unspecified: Secondary | ICD-10-CM

## 2023-08-21 DIAGNOSIS — G629 Polyneuropathy, unspecified: Secondary | ICD-10-CM

## 2023-08-21 DIAGNOSIS — I10 Essential (primary) hypertension: Secondary | ICD-10-CM

## 2023-08-21 IMAGING — US US ABDOMEN LIMITED
1 series · 14 of 25 positions shown · non-contrast
Comparison: 12/15/2020

CLINICAL DATA: Abnormal LFT

EXAM:
ULTRASOUND ABDOMEN LIMITED RIGHT UPPER QUADRANT

[Series 1: us abdomen limited ruq (liver/gb) · 14 of 86 slices shown]
[im 1/86]
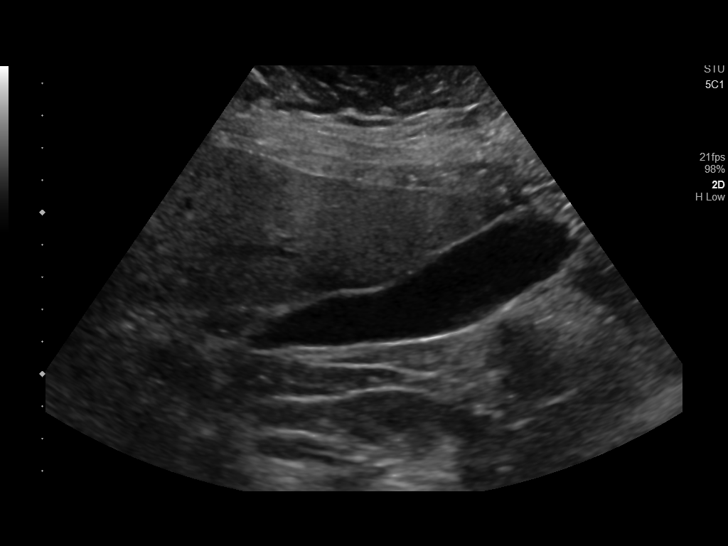
[im 8/86]
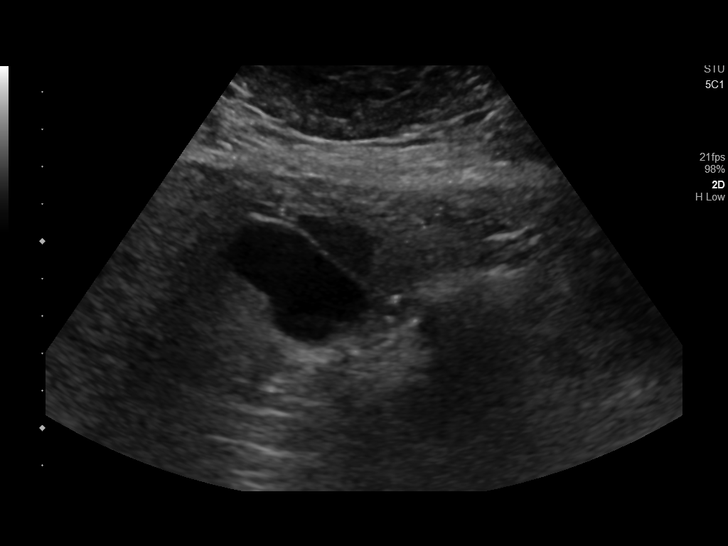
[im 15/86]
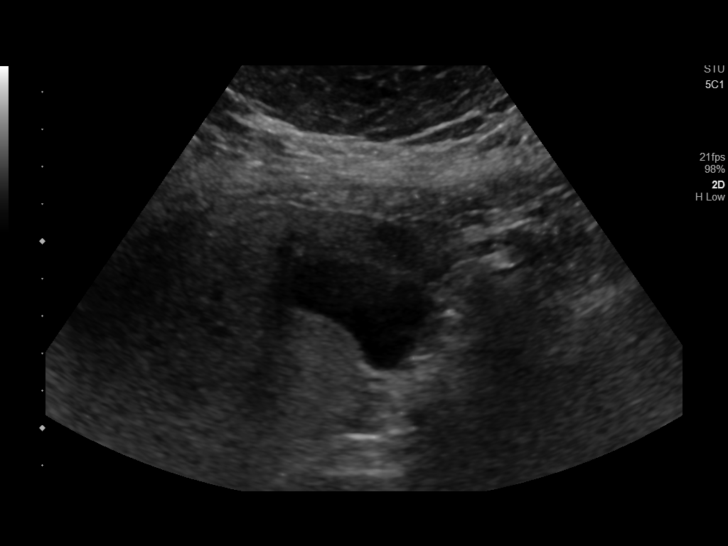
[im 22/86]
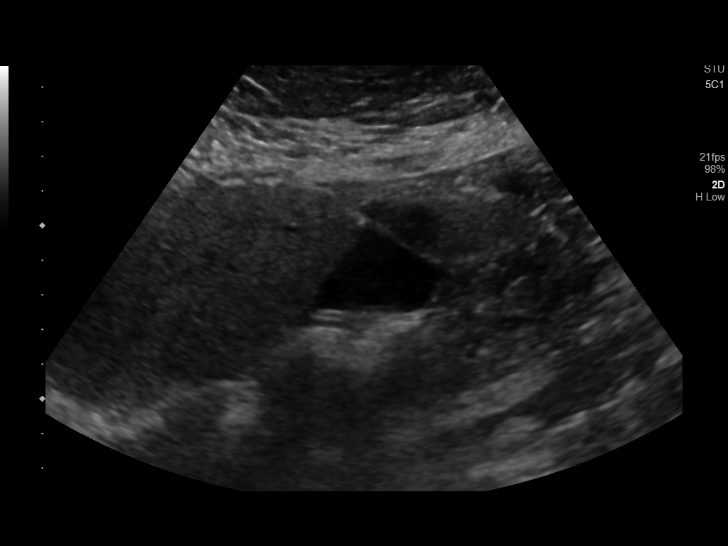
[im 29/86]
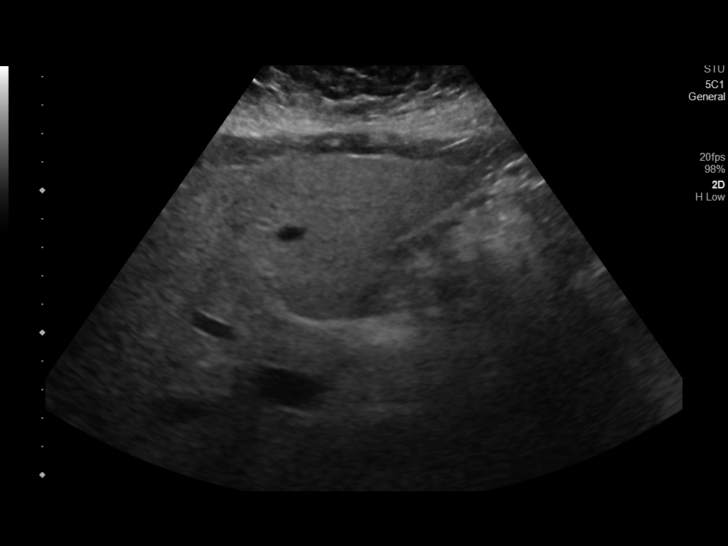
[im 32/86]
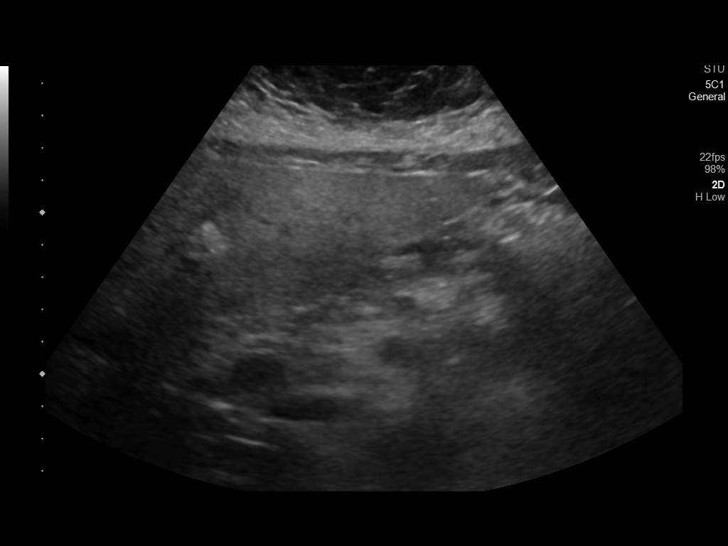
[im 39/86]
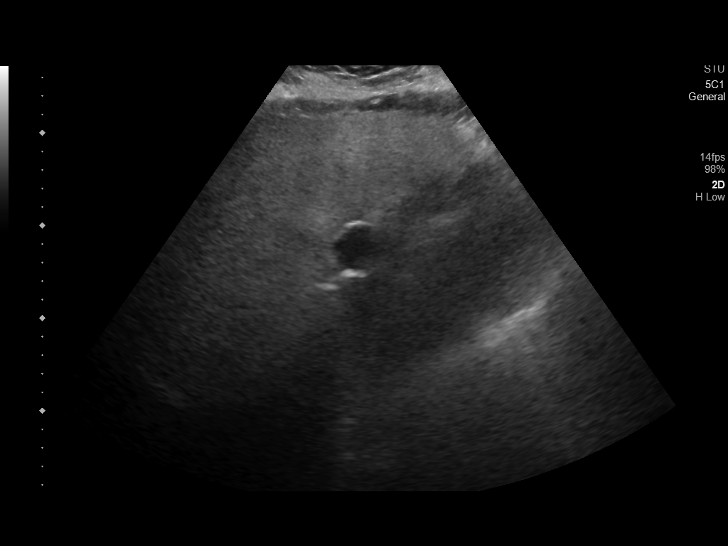
[im 47/86]
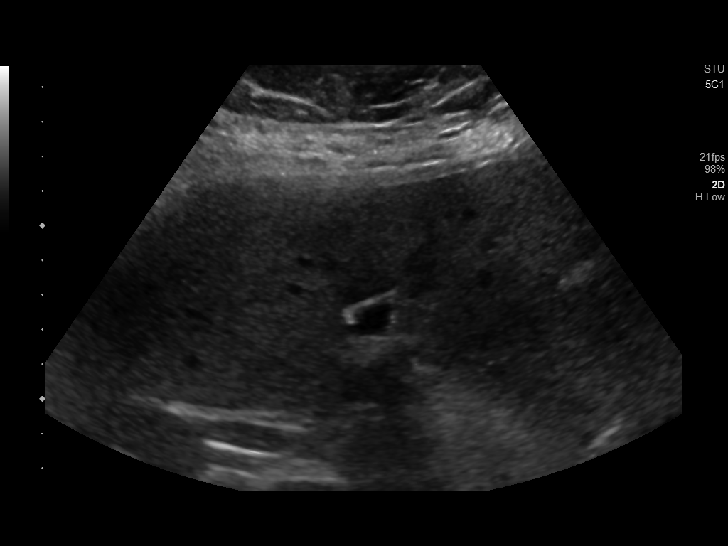
[im 54/86]
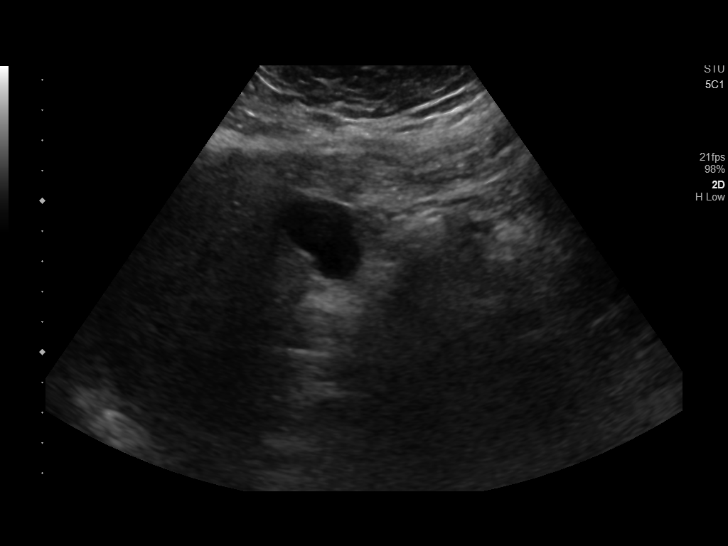
[im 57/86]
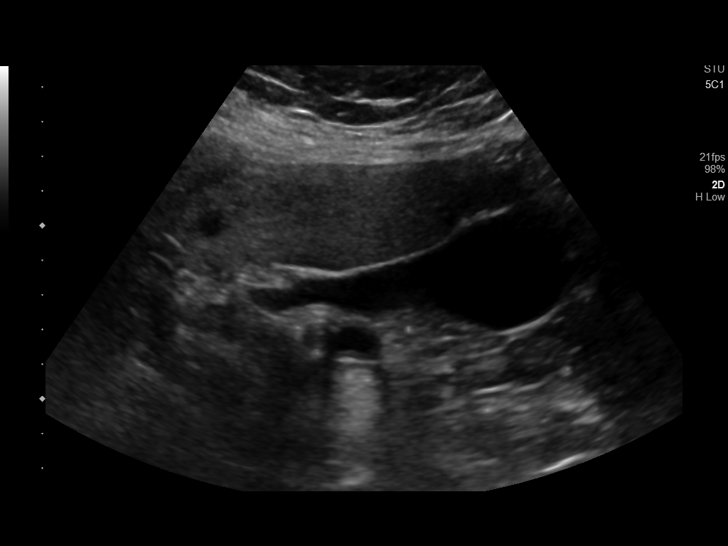
[im 64/86]
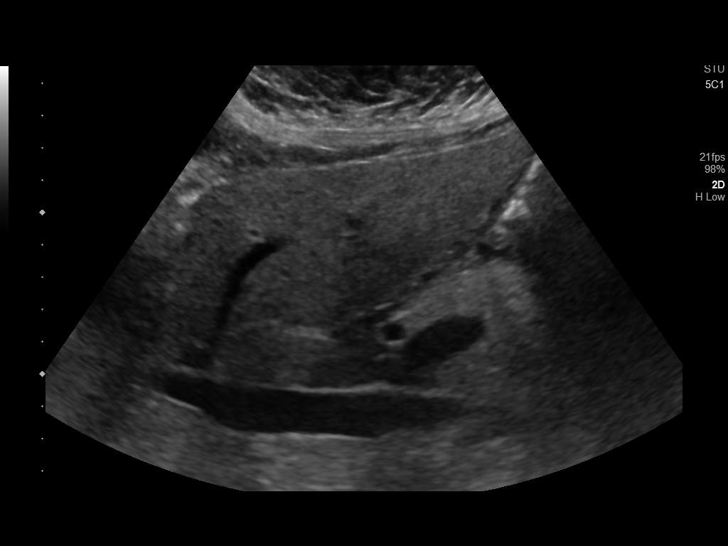
[im 71/86]
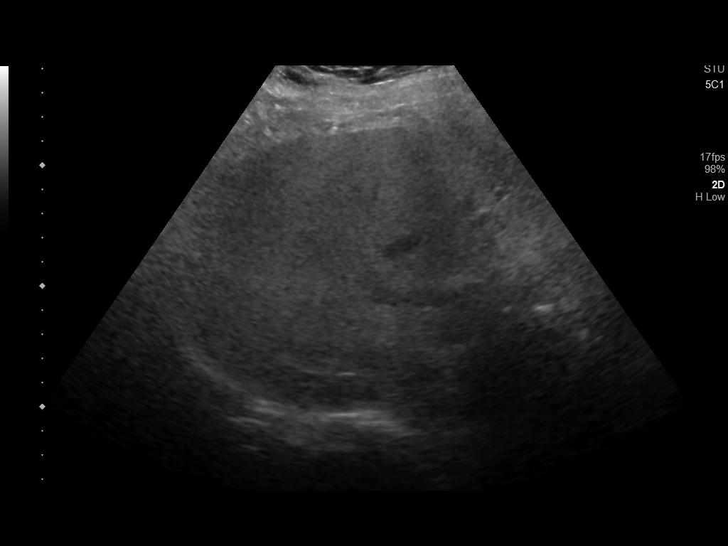
[im 78/86]
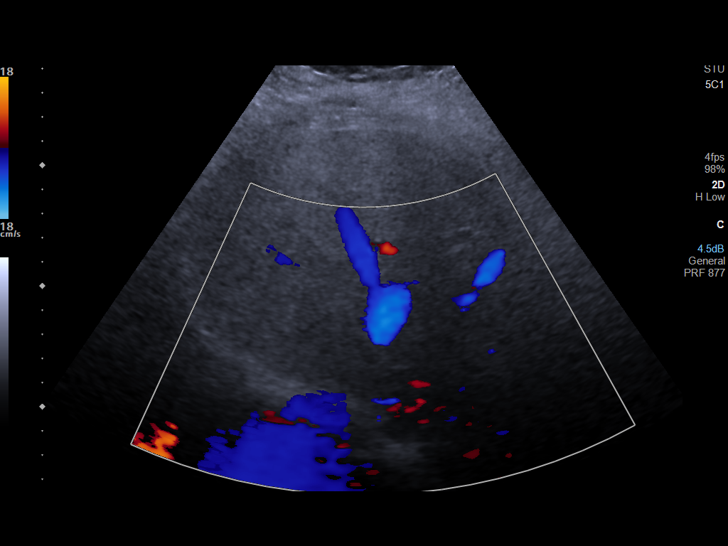
[im 86/86]
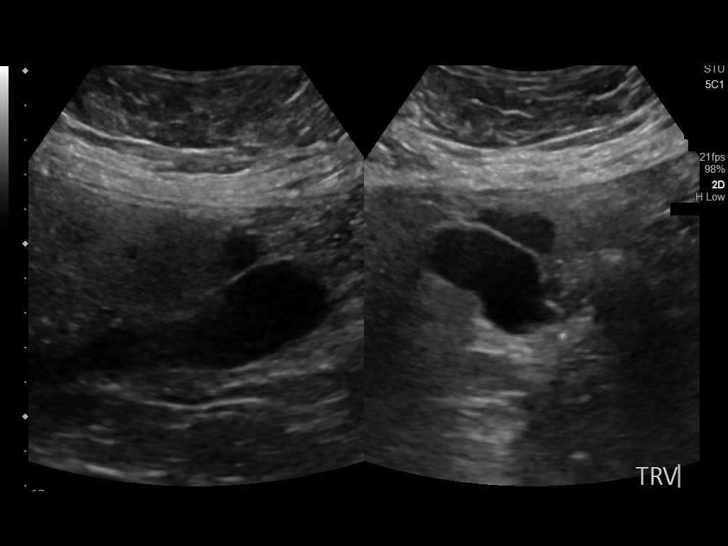

[14 of 25 positions shown; findings below may reference images not displayed]

FINDINGS: Gallbladder:

No gallstones or wall thickening visualized. No sonographic Murphy
sign noted by sonographer.

Common bile duct:

Diameter: 3.4 mm

Liver:

Diffusely echogenic. Focal hypoechoic regions adjacent to the
gallbladder favored to represent fat sparing. Portal vein is patent
on color Doppler imaging with normal direction of blood flow towards
the liver.

Other: None.
IMPRESSION: 1. Echogenic liver consistent with hepatic steatosis with suspected
fat sparing near the gallbladder
2. Otherwise negative examination

## 2023-08-27 ENCOUNTER — Ambulatory Visit: Admitting: Physician Assistant

## 2023-08-27 ENCOUNTER — Encounter: Payer: Self-pay | Admitting: Physician Assistant

## 2023-08-27 VITALS — BP 110/77

## 2023-08-27 DIAGNOSIS — Z79899 Other long term (current) drug therapy: Secondary | ICD-10-CM | POA: Diagnosis not present

## 2023-08-27 DIAGNOSIS — L408 Other psoriasis: Secondary | ICD-10-CM

## 2023-08-27 DIAGNOSIS — L280 Lichen simplex chronicus: Secondary | ICD-10-CM

## 2023-08-27 DIAGNOSIS — L4050 Arthropathic psoriasis, unspecified: Secondary | ICD-10-CM | POA: Diagnosis not present

## 2023-08-27 NOTE — Progress Notes (Signed)
   New Patient Visit   Subjective  Elizabeth Patrick is a 53 y.o. NEW PATIENT female who presents for the following: Pt states she has psoriasis with psoriatic arthritis on her hands and feet this has been going on for 7 years. Pt states her hands and feet burn and get itchy when she has a flare. Pt is taking cosentyx  she has been taking it for 7 weeks now. Pt uses [place] soap with shea butter. Pt uses curel itch defense lotion. Pt uses arm and hammer to was her clothes, no fabric softer or dryer sheets.Pt has a family history of skin cancer.  Other concern: scaly patches on wrist and elbow    The following portions of the chart were reviewed this encounter and updated as appropriate: medications, allergies, medical history  Review of Systems:  No other skin or systemic complaints except as noted in HPI or Assessment and Plan.  Objective  Well appearing patient in no apparent distress; mood and affect are within normal limits.  A focused examination was performed of the following areas:   Relevant exam findings are noted in the Assessment and Plan.    Assessment & Plan  Lichenification  Exam: lichenified patch of right wrist and left elbow   Treatment Plan: Recommended Amlactin lotion  PALMOPLANTAR PSORIASIS with psoriatic arthritis  Exam: clear today     Psoriasis is a chronic non-curable, but treatable genetic/hereditary disease that may have other systemic features affecting other organ systems such as joints (Psoriatic Arthritis). It is associated with an increased risk of inflammatory bowel disease, heart disease, non-alcoholic fatty liver disease, and depression.  Treatments include light and laser treatments; topical medications; and systemic medications including oral and injectables.  Treatment Plan: Continue Cosentyx  as prescribed by her Internist  May restart Clobetasol  as needed for flares   I, Doyce Pan, CMA, am acting as scribe for [employer] K,  PA-C.  LICHENIFICATION   PALMOPLANTAR PSORIASIS    Return in about 6 months (around 02/27/2024) for Psoriasis.   Documentation: I have reviewed the above documentation for accuracy and completeness, and I agree with the above.  Sabeen Piechocki K, PA-C

## 2023-08-27 NOTE — Patient Instructions (Signed)

## 2023-09-03 ENCOUNTER — Encounter: Payer: Self-pay | Admitting: Internal Medicine

## 2023-09-04 NOTE — Telephone Encounter (Signed)
 Last Fill: 07/09/2023  Labs: 06/25/2023 Alk. Phos. 34  Next Visit: 09/29/2023  Last Visit: 06/25/2023  DX:  Rheumatoid arthritis, involving unspecified site, unspecified whether rheumatoid factor present   Current Dose per office note 06/25/2023: not discussed  Okay to refill Meloxicam ?

## 2023-09-05 MED ORDER — MELOXICAM 15 MG PO TABS: 15.0000 mg | ORAL_TABLET | Freq: Every day | ORAL | 1 refills | Status: DC

## 2023-09-08 ENCOUNTER — Encounter: Payer: Self-pay | Admitting: Internal Medicine

## 2023-09-08 DIAGNOSIS — L409 Psoriasis, unspecified: Secondary | ICD-10-CM

## 2023-09-08 DIAGNOSIS — Z79899 Other long term (current) drug therapy: Secondary | ICD-10-CM

## 2023-09-08 MED ORDER — COSENTYX UNOREADY 300 MG/2ML ~~LOC~~ SOAJ: 300.0000 mg | SUBCUTANEOUS | 0 refills | Status: DC

## 2023-09-08 NOTE — Telephone Encounter (Signed)
 Last Fill: 07/02/2023  Labs: 06/25/2023 Alk. Phos 34  TB Gold: 10/2022  TB neg   Next Visit: 09/29/2023  Last Visit: 06/25/2023  IK:Ednmpjdpd and similar disorder   Current Dose per office note 06/25/2023: Cosentyx  injections 300 mg [place] with loading dose at 0 and 4 weeks, then monthly.   Okay to refill Cosentyx ?

## 2023-09-15 DIAGNOSIS — E109 Type 1 diabetes mellitus without complications: Secondary | ICD-10-CM | POA: Diagnosis not present

## 2023-09-16 NOTE — Progress Notes (Signed)
 Office Visit Note  Patient: Elizabeth Patrick             Date of Birth: 05-Jul-1970           MRN: [id number]             PCP: Chandra Toribio POUR, MD Referring: Chandra Toribio POUR, MD Visit Date: 09/29/2023   Subjective:  Medical Management of Chronic Issues (Stage 4 arthritis of the right hip. Patient has seen a New interventional pain management to see about other options for the pain)  Discussed the use of AI scribe software for clinical note transcription with the patient, who gave verbal consent to proceed.  History of Present Illness   Elizabeth Patrick is a 53 y.o. female here for follow up with psoriatic arthritis and possibly seronegative RA now on Cosentyx  300 mg [place] monthly.  She has experienced significant improvement in her psoriasis symptoms since starting Cosentyx . After receiving a couple of shots, she is due for her second monthly dose soon. Her feet, previously covered in pustules, have cleared, and she currently has no lesions on her hands, which typically flare up under stress.  Despite the improvement in psoriasis, there is no significant change in her joint symptoms. She finds it difficult to distinguish between psoriatic arthritis and osteoarthritis symptoms. She experiences persistent pain in the back of her head, neck, spine, and down one shoulder. She has undergone x-rays and an MRI of her neck and receives nerve ablations every six months for lower back pain. She can feel when the effects of the ablation start to wear off as the pain returns.  She had an injection in the left hip 2 months ago with orthopedics with good benefit.  She describes new pain at the tip of her elbow and in one finger, which she attributes to osteoarthritis. She is scheduled for hand surgery but is hesitant to proceed. Her hands are affected by osteoarthritis, causing pain across her hand and elbow.  In terms of family history, her mother has purple feet, similar to her own condition, which she  attributes to venous insufficiency.     Previous HPI 06/25/2023 Elizabeth Patrick is a 54 year old female with psoriatic arthritis possibly seronegative RA on actemra  infusions who presents with joint pain and skin issues.   She has experienced significant joint pain and skin issues, with a history of psoriasis. Her symptoms worsened after contracting COVID-19 last month, which lasted two and a half weeks and resulted in lingering exhaustion and joint aches, but no significant respiratory symptoms. During this time, her psoriasis flared, with peeling skin and blisters on her hands and feet.   She has been on Actemra  infusions for seven years for joint issues, but reports progressive deterioration in her condition. Initially, she was able to work full-time and perform daily activities, but her condition has worsened to the point where she now requires a cane for mobility. She has also been on prednisone  for two and a half years. Recently, she started Ozempic  to aid with weight loss, as her weight has been a contributing factor to her joint issues.   She experiences significant pain in her knees, hips, and elbows, with her left leg swelling and causing frequent falls. She has been using HCTZ to manage the swelling, but it has not been effective. She also reports neuropathy, which makes wearing compression garments difficult. She has a history of yeast infections and rashes, particularly in areas where skin touches skin,  and has been managing these with topical treatments.   Her medication history includes methotrexate, which she discontinued due to liver issues and mouth sores. She has been on hydroxychloroquine  for a year and a half, but is uncertain of its effectiveness in reducing her pain. She wants to reduce her medication burden if possible.        Previous HPI 04/08/23 Elizabeth Patrick is a 53 year old female with osteoarthritis, seronegative rheumatoid arthritis, psoriasis, and chronic  neuropathic pain who presents for transferring care of her RA.   She has a history of seronegative rheumatoid arthritis diagnosed ten years ago based on polyarticular small joint inflammation and highly abnormal Vectra labwork. She is currently receiving Actemra  infusions monthly, with her next dose scheduled for April 8th. She has tried multiple medications in the past, including Enbrel, Humira, Xeljanz, methotrexate, and leflunomide, with varying degrees of success and side effects. Actemra  is 'okay' but causes GI symptoms after each infusion. She has not tried subcutaneous Actemra . She is tired all the time, experiences pain in small joints like her hands and toes, and has headaches and shoulder pain. No frequent infections while on Actemra , except for a recent UTI. She experiences swelling in her legs and varicose veins.   She underwent surgery on her thumb at the end of January for osteoarthritis. Post-surgery, the neuropathy in her hand has resolved, although she continues to experience significant pain in her hands. She has been using Plaquenil  for the past eight to nine months but is unsure of its efficacy. She experiences painful hands and has undergone cortisone injections every three months and ablation for her back pain due to degenerative changes.   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 She has received spinal injections, which she now realizes were effective as her symptoms have returned. She experiences neuropathy, particularly at night, and takes a high dose of Lyrica to manage it.   She has psoriasis on her palms and soles, which flares up with stress or when her body is 'overtaxed'. She is currently experiencing a breakout on her feet. She uses clobetasol  for her psoriasis and nystatin  for intertrigo, which she is managing due to weight loss-related skin changes.   She has a history of  diabetes, diagnosed with a hemoglobin A1c of 6.1, but reports no significant hyperglycemia or complications such as eye or kidney problems. She is on Ozempic  for diabetes management. She also reports a history of mild carpal tunnel syndrome diagnosed before her RA diagnosis.   She has moved to a house without stairs to accommodate her mobility issues.    DMARD Hx Methotrexate - PO GI Sx, Anthon injection site reactions Leflunomide - Nonresponder Humira - Nonresponder (?) Enbrel - Nonresponder (?) Earma - Nonresponder Actemra  - Current treatment Hydroxychloroquine  - Current treatment   Labs reviewed 02/2023 CBC wnl CMP wnl Lipids TGs high   10/2022 HAV/HBV/HCV neg TB neg   Review of Systems  Constitutional:  Positive for fatigue.  HENT:  Positive for mouth sores and mouth dryness.   Eyes:  Positive for dryness.  Respiratory:  Negative for shortness of breath.   Cardiovascular:  Negative for chest pain and palpitations.  Gastrointestinal:  Negative for blood in stool, constipation and diarrhea.  Endocrine: Positive for increased urination.  Genitourinary:  Positive for involuntary urination.  Musculoskeletal:  Positive for joint pain, gait problem, joint pain, joint swelling, myalgias, muscle weakness,  morning stiffness, muscle tenderness and myalgias.  Skin:  Positive for rash and sensitivity to sunlight. Negative for color change and hair loss.  Allergic/Immunologic: Positive for susceptible to infections.  Neurological:  Positive for headaches. Negative for dizziness.  Hematological:  Negative for swollen glands.  Psychiatric/Behavioral:  Positive for sleep disturbance. Negative for depressed mood. The patient is not nervous/anxious.     PMFS History:  Patient Active Problem List   Diagnosis Date Noted   Psoriatic arthritis (HCC) 09/29/2023   Healthcare maintenance 09/29/2023   Chronic low back pain (Bilateral) w/o sciatica 09/24/2023   Chronic knee pain (Bilateral)  09/24/2023   Cervicalgia 09/24/2023   Cervicogenic headache (Right) 09/24/2023   Primary osteoarthritis involving multiple joints 09/24/2023   Chronic pain syndrome 09/23/2023   Pharmacologic therapy 09/23/2023   Disorder of skeletal system 09/23/2023   Problems influencing health status 09/23/2023   Cervical radiculopathy 09/23/2023   Long term (current) use of opiate analgesic 09/23/2023   Chronic neck pain 09/23/2023   Primary osteoarthritis of knees (Bilateral) 09/23/2023   Wrist pain, right 09/23/2023   Lumbosacral spondylosis without myelopathy 09/23/2023   Polyneuropathy due to type 2 diabetes mellitus (HCC) 09/23/2023   Encounter for long-term opiate analgesic use 09/23/2023   Abnormal MRI, cervical spine (06/18/2022) 09/23/2023   Dermatitis due to sun 06/23/2023   High risk medication use 04/08/2023   Vitamin D  deficiency 04/08/2023   Screening for tuberculosis 04/08/2023   Intertrigo 12/16/2022   Chronic idiopathic constipation 09/06/2022   Gastroparesis 06/26/2022   Chronic hip pain (Right) 12/15/2021   DDD (degenerative disc disease), lumbosacral 12/15/2021   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/2021   Vitamin B12 deficiency 07/23/2021   Severe sleep apnea 07/04/2021   Psoriasis and similar disorder 05/27/2021   Depression, major, single episode, moderate (HCC) 04/29/2021   Loud snoring 04/17/2021   Menopause 04/16/2021   Morbid obesity (HCC) 04/05/2021   Type 2 diabetes mellitus without complication, with long-term current use of insulin  (HCC) 11/28/2020   Gastroesophageal reflux disease 11/28/2020   Recurrent UTI 10/17/2020   Hyperlipidemia associated with type 2 diabetes mellitus (HCC) 10/11/2020   Bilateral lower extremity edema 10/11/2020   Diabetic polyneuropathy associated with type 2 diabetes mellitus (HCC) 10/11/2020   NASH (nonalcoholic steatohepatitis) 10/10/2020   Hypertension associated with diabetes (HCC) 09/26/2020   Osteoarthritis 09/26/2020    Past  Medical History:  Diagnosis Date   Abnormal uterine bleeding    Anemia    Blood transfusion without reported diagnosis    Complication of anesthesia    can wake up during procedure   Diabetes mellitus without complication (HCC)    Dysmenorrhea    Endometriosis    Fibroid    Genital warts    GERD (gastroesophageal reflux disease)    Hypertension    PONV (postoperative nausea and vomiting)    RA (rheumatoid arthritis) (HCC)    Radiculopathy     Family History  Problem Relation Age of Onset   Obesity Mother    Coronary artery disease Mother    Bipolar disorder Mother    Multiple sclerosis Mother    Hypertension Father    Heart attack Father    Vascular Disease Father    Breast cancer Maternal Grandmother 60   Past Surgical History:  Procedure Laterality Date   ABDOMINAL HYSTERECTOMY     CARPOMETACARPEL SUSPENSION PLASTY Left 02/13/2023   Procedure: Left trapeziectomy with ligament reconstruction and tendon interposition;  Surgeon: Romona Harari, MD;  Location: [facility] OR;  Service: Orthopedics;  Laterality: Left;  regional, he can start earlier if need be to fit   CLOSED REDUCTION FINGER WITH PERCUTANEOUS PINNING Left 02/13/2023   Procedure: possible percutaneous pinning of thumb MCP join;  Surgeon: Romona Harari, MD;  Location: [facility] OR;  Service: Orthopedics;  Laterality: Left;  regional he can start earlier if need be to fit   COLONOSCOPY WITH PROPOFOL  N/A 06/26/2022   Procedure: COLONOSCOPY WITH PROPOFOL ;  Surgeon: Therisa Bi, MD;  Location: [facility] ENDOSCOPY;  Service: Gastroenterology;  Laterality: N/A;  Patient informed the office that she wakes from anesthesia combative and pulliing at tubes.  She wanted us  to be aware.   ESOPHAGOGASTRODUODENOSCOPY (EGD) WITH PROPOFOL  N/A 06/26/2022   Procedure: ESOPHAGOGASTRODUODENOSCOPY (EGD) WITH PROPOFOL ;  Surgeon: Therisa Bi, MD;  Location: [facility] ENDOSCOPY;  Service: Gastroenterology;  Laterality: N/A;   PARATHYROIDECTOMY     Social  History   Social History Narrative   Not on file   Immunization History  Administered Date(s) Administered   Hep A / Hep B 08/13/2021   Influenza, Seasonal, Injecte, Preservative Fre 10/15/2022, 09/23/2023   Influenza,inj,Quad PF,6+ Mos 11/01/2014, 11/23/2020, 10/11/2021   Moderna Sars-Covid-2 Vaccination 05/31/2019, 06/28/2019, 01/14/2020   Pneumococcal Conjugate-13 04/28/2014     Objective: Vital Signs: BP 116/64   Pulse 64   Temp 97.6 F (36.4 C)   Resp 16   Ht 5' 4 (1.626 m)   Wt (!) 346 lb 12.8 oz (157.3 kg)   BMI 59.53 kg/m    Physical Exam Constitutional:      Appearance: She is obese.  Eyes:     Conjunctiva/sclera: Conjunctivae normal.  Cardiovascular:     Rate and Rhythm: Normal rate and regular rhythm.  Pulmonary:     Effort: Pulmonary effort is normal.     Breath sounds: Normal breath sounds.  Lymphadenopathy:     Cervical: No cervical adenopathy.  Skin:    General: Skin is warm and dry.     Comments: Superficial venous varicosities b/l legs  Neurological:     Mental Status: She is alert.  Psychiatric:        Mood and Affect: Mood normal.      Musculoskeletal Exam:  Elbows full ROM no tenderness or swelling Right wrist painful with full extension, PIP and DIP joint tenderness to pressure R>L, heberdon's nodes present, right 1st CMC squaring with mild MCP subluxation, reducible with a lot of pain Tenderness throughout multiple areas on upper and lower back Knees tenderness to pressure on anterior and medial joint line, no palpable effusions Ankles full ROM no tenderness or swelling  Investigation: No additional findings.  Imaging: DG Knee Complete 4 Views Left Result Date: 10/06/2023 CLINICAL DATA:  Left knee pain EXAM: LEFT KNEE - COMPLETE 4+ VIEW COMPARISON:  Knee radiograph 09/19/2020 FINDINGS: There is severe tricompartmental osteoarthritis most pronounced in the medial compartment. No evidence for acute fracture or dislocation. No joint  effusion. Regional soft tissues unremarkable. IMPRESSION: Severe tricompartmental osteoarthritis. Electronically Signed   By: Bard Moats M.D.   On: 10/06/2023 20:05   DG Knee Complete 4 Views Right Result Date: 10/06/2023 CLINICAL DATA:  Right knee pain EXAM: DG KNEE COMPLETE 4+V*R* COMPARISON:  None Available. FINDINGS: There is severe tricompartmental osteoarthritis most pronounced within the medial compartment. No acute osseous abnormality. No joint effusion. Regional soft tissues unremarkable. IMPRESSION: Severe tricompartmental osteoarthritis. Electronically Signed   By: Bard Moats M.D.   On: 10/06/2023 20:04   DG HIPS BILAT WITH PELVIS 3-4 VIEWS Result Date: 10/06/2023 CLINICAL DATA:  Chronic bilateral hip pain EXAM: DG HIP (WITH OR WITHOUT PELVIS) 3-4V BILAT COMPARISON:  Renal stone CT 12/15/2020; pelvic radiograph 06/25/2023 FINDINGS: Redemonstrated severe bilateral hip joint degenerative changes, right greater than left. No acute osseous abnormality. SI joints unremarkable. Lumbar spine degenerative changes. IMPRESSION: Severe bilateral hip joint degenerative changes, right greater than left. Electronically Signed   By: Bard Moats M.D.   On: 10/06/2023 20:04   DG Lumbar Spine Complete W/Bend Result Date: 10/06/2023 CLINICAL DATA:  Low back pain EXAM: LUMBAR SPINE - COMPLETE WITH BENDING VIEWS COMPARISON:  Lumbar spine radiograph 09/19/2020 FINDINGS: Grade 1 anterolisthesis of L4 on L5. Multilevel degenerative disc disease throughout the lumbar spine most pronounced L3-4 and L5-S1. Preservation of the vertebral body heights. Lower lumbar spine facet degenerative changes. SI joints unremarkable. IMPRESSION: Multilevel degenerative disc and facet disease. Electronically Signed   By: Bard Moats M.D.   On: 10/06/2023 20:02   DG Cervical Spine With Flex & Extend Result Date: 10/06/2023 CLINICAL DATA:  Cervicalgia. EXAM: CERVICAL SPINE COMPLETE WITH FLEXION AND EXTENSION VIEWS COMPARISON:  MR  cervical spine 06/09/2022 FINDINGS: Normal anatomic alignment. No evidence for acute fracture or dislocation. Multilevel degenerative disc disease most pronounced C6-7 multilevel facet degenerative changes. Right C3-4 and C6-7 osseous neural foraminal narrowing. Left C4-5 and C5-6 osseous neural foraminal narrowing. Lung apices are clear. Lateral masses articulate appropriately with the dens. IMPRESSION: 1. Multilevel degenerative disc and facet disease. Electronically Signed   By: Bard Moats M.D.   On: 10/06/2023 20:00    Recent Labs: Lab Results  Component Value Date   WBC 6.7 09/29/2023   HGB 13.3 09/29/2023   PLT 235 09/29/2023   NA 142 09/29/2023   K 4.0 09/29/2023   CL 104 09/29/2023   CO2 30 09/29/2023   GLUCOSE 97 09/29/2023   BUN 16 09/29/2023   CREATININE 0.65 09/29/2023   BILITOT 0.5 09/29/2023   ALKPHOS 37 (L) 10/15/2022   AST 15 09/29/2023   ALT 15 09/29/2023   PROT 6.9 09/29/2023   ALBUMIN 4.5 10/15/2022   CALCIUM  9.9 09/29/2023   QFTBGOLDPLUS NEGATIVE 09/29/2023    Speciality Comments: PLQ eye exam: 05/16/2023 No Toxicity @[facility]  Procedures:  No procedures performed Allergies: Alcohol, Dilantin [phenytoin], and Methotrexate   Assessment / Plan:     Visit Diagnoses: Psoriasis and similar disorder Psoriatic arthritis managed with Cosentyx . Psoriasis symptoms improved, joint symptoms unchanged. Differentiation between psoriatic arthritis and osteoarthritis symptoms unclear. Blood tests for inflammation normal. - Continue Cosentyx  300 mg [place] monthly.  High risk medication use - Plan: CBC with Differential/Platelet, Comprehensive metabolic panel with GFR, QuantiFERON-TB Gold Plus - Order blood count and metabolic panel to monitor for potential side effects of Cosentyx . - Updated TB screening, last reviewed 10/2022 negative  Osteoarthritis of hands and fingers Osteoarthritis with pain and deformity in elbow and fingers. Aware of diagnosis,  hesitant about hand surgery.  Foraminal stenosis of lumbar spine Foraminal stenosis managed with nerve ablation. Pain in head, neck, spine, and shoulder due to nerve compression. Ablation provides relief, effects diminish over time. Continuing with nerve ablation every six months as scheduled.       Orders: Orders Placed This Encounter  Procedures   CBC with Differential/Platelet   Comprehensive metabolic panel with GFR   QuantiFERON-TB Gold Plus   No orders of the defined types were placed in this encounter.    Follow-Up Instructions: Return in about 3 months (around 12/29/2023) for PsA/PsO on COS f/u 3mos.  Lonni LELON Ester, MD  Note - This record has been created using [employer].  Chart creation errors have been sought, but may not always  have been located. Such creation errors do not reflect on  the standard of medical care.

## 2023-09-19 ENCOUNTER — Encounter: Payer: Self-pay | Admitting: Family Medicine

## 2023-09-19 ENCOUNTER — Other Ambulatory Visit: Payer: Self-pay

## 2023-09-19 ENCOUNTER — Encounter: Payer: Self-pay | Admitting: Internal Medicine

## 2023-09-19 MED ORDER — SEMAGLUTIDE (1 MG/DOSE) 4 MG/3ML ~~LOC~~ SOPN: 1.0000 mg | PEN_INJECTOR | SUBCUTANEOUS | 2 refills | Status: DC

## 2023-09-22 ENCOUNTER — Other Ambulatory Visit: Payer: Self-pay

## 2023-09-22 MED ORDER — SEMAGLUTIDE (2 MG/DOSE) 8 MG/3ML ~~LOC~~ SOPN
2.0000 mg | PEN_INJECTOR | SUBCUTANEOUS | 2 refills | Status: DC
Start: 2023-09-22 — End: 2023-09-22

## 2023-09-22 MED ORDER — SEMAGLUTIDE (2 MG/DOSE) 8 MG/3ML ~~LOC~~ SOPN: 2.0000 mg | PEN_INJECTOR | SUBCUTANEOUS | 2 refills | Status: DC

## 2023-09-22 NOTE — Addendum Note (Signed)
 Addended byBETHA GAYLE NUMBERS on: 09/22/2023 07:58 AM   Modules accepted: Orders

## 2023-09-22 NOTE — Therapy (Incomplete)
 OUTPATIENT PHYSICAL THERAPY LOWER EXTREMITY EVALUATION   Patient Name: Elizabeth Patrick MRN: [id number] DOB:1970-07-22, 53 y.o., female Today's Date: 09/22/2023  END OF SESSION:   Past Medical History:  Diagnosis Date   Abnormal uterine bleeding    Anemia    Blood transfusion without reported diagnosis    Complication of anesthesia    can wake up during procedure   Diabetes mellitus without complication (HCC)    Dysmenorrhea    Endometriosis    Fibroid    Genital warts    GERD (gastroesophageal reflux disease)    Hypertension    PONV (postoperative nausea and vomiting)    RA (rheumatoid arthritis) (HCC)    Radiculopathy    Past Surgical History:  Procedure Laterality Date   ABDOMINAL HYSTERECTOMY     CARPOMETACARPEL SUSPENSION PLASTY Left 02/13/2023   Procedure: Left trapeziectomy with ligament reconstruction and tendon interposition;  Surgeon: Romona Harari, MD;  Location: [facility] OR;  Service: Orthopedics;  Laterality: Left;  regional, he can start earlier if need be to fit   CLOSED REDUCTION FINGER WITH PERCUTANEOUS PINNING Left 02/13/2023   Procedure: possible percutaneous pinning of thumb MCP join;  Surgeon: Romona Harari, MD;  Location: [facility] OR;  Service: Orthopedics;  Laterality: Left;  regional he can start earlier if need be to fit   COLONOSCOPY WITH PROPOFOL  N/A 06/26/2022   Procedure: COLONOSCOPY WITH PROPOFOL ;  Surgeon: Therisa Bi, MD;  Location: [facility] ENDOSCOPY;  Service: Gastroenterology;  Laterality: N/A;  Patient informed the office that she wakes from anesthesia combative and pulliing at tubes.  She wanted us  to be aware.   ESOPHAGOGASTRODUODENOSCOPY (EGD) WITH PROPOFOL  N/A 06/26/2022   Procedure: ESOPHAGOGASTRODUODENOSCOPY (EGD) WITH PROPOFOL ;  Surgeon: Therisa Bi, MD;  Location: [facility] ENDOSCOPY;  Service: Gastroenterology;  Laterality: N/A;   PARATHYROIDECTOMY     Patient Active Problem List   Diagnosis Date Noted   Dermatitis due to sun 06/23/2023   High  risk medication use 04/08/2023   Vitamin D  deficiency 04/08/2023   Screening for tuberculosis 04/08/2023   Intertrigo 12/16/2022   Chronic idiopathic constipation 09/06/2022   Gastroparesis 06/26/2022   Right hip pain 12/15/2021   DDD (degenerative disc disease), lumbosacral 12/15/2021   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/2021   Vitamin B12 deficiency 07/23/2021   Severe sleep apnea 07/04/2021   Psoriasis and similar disorder 05/27/2021   Depression, major, single episode, moderate (HCC) 04/29/2021   Loud snoring 04/17/2021   Menopause 04/16/2021   Morbid obesity (HCC) 04/05/2021   Osteoarthritis of both knees 04/04/2021   Type 2 diabetes mellitus without complication, with long-term current use of insulin  (HCC) 11/28/2020   Gastroesophageal reflux disease 11/28/2020   Recurrent UTI 10/17/2020   Hyperlipidemia associated with type 2 diabetes mellitus (HCC) 10/11/2020   Bilateral lower extremity edema 10/11/2020   Diabetic polyneuropathy associated with type 2 diabetes mellitus (HCC) 10/11/2020   NASH (nonalcoholic steatohepatitis) 10/10/2020   Rheumatoid arthritis (HCC) 09/26/2020   Hypertension associated with diabetes (HCC) 09/26/2020   Osteoarthritis 09/26/2020    PCP: Toribio Comer MD  REFERRING PROVIDER: Lonell Sprang MD  REFERRING DIAG:  M16.0 (ICD-10-CM) - Bilateral primary osteoarthritis of hip  M25.551,M25.552,G89.29 (ICD-10-CM) - Chronic hip pain, bilateral  [phone] (ICD-10-CM) - Class 3 severe obesity with body mass index (BMI) of 50.0 to 59.9 in adult, unspecified obesity type, unspecified whether serious comorbidity present  M17.0 (ICD-10-CM) - Primary osteoarthritis of both knees    THERAPY DIAG:  No diagnosis found.  Rationale for Evaluation and Treatment: Rehabilitation  ONSET  DATE: ***  SUBJECTIVE:   SUBJECTIVE STATEMENT: ***  PERTINENT HISTORY: R hip injection  PAIN:  Are you having pain? Yes: NPRS scale: *** Pain location: Bilat hip R>L Pain  description: *** Aggravating factors: *** Relieving factors: ***  PRECAUTIONS: {Therapy precautions:24002}  RED FLAGS: None   WEIGHT BEARING RESTRICTIONS: No  FALLS:  Has patient fallen in last 6 months? {fallsyesno:27318}  LIVING ENVIRONMENT: Lives with: {OPRC lives with:25569::lives with their family} Lives in: {Lives in:25570} Stairs: {opstairs:27293} Has following equipment at home: {Assistive devices:23999}  OCCUPATION: ***  PLOF: {PLOF:24004}  PATIENT GOALS: ***  NEXT MD VISIT: ***  OBJECTIVE:  Note: Objective measures were completed at Evaluation unless otherwise noted.  DIAGNOSTIC FINDINGS: ***  PATIENT SURVEYS:  LEFS  COGNITION: Overall cognitive status: Within functional limits for tasks assessed     SENSATION: {sensation:27233}  EDEMA:  {edema:24020}  MUSCLE LENGTH: Hamstrings: Right *** deg; Left *** deg   POSTURE: {posture:25561}  PALPATION: ***  LOWER EXTREMITY ROM:  {AROM/PROM:27142} ROM Right eval Left eval  Hip flexion    Hip extension    Hip abduction    Hip adduction    Hip internal rotation    Hip external rotation    Knee flexion    Knee extension    Ankle dorsiflexion    Ankle plantarflexion    Ankle inversion    Ankle eversion     (Blank rows = not tested)  LOWER EXTREMITY MMT:  MMT Right eval Left eval  Hip flexion    Hip extension    Hip abduction    Hip adduction    Hip internal rotation    Hip external rotation    Knee flexion    Knee extension    Ankle dorsiflexion    Ankle plantarflexion    Ankle inversion    Ankle eversion     (Blank rows = not tested)  LOWER EXTREMITY SPECIAL TESTS:  {LEspecialtests:26242}  FUNCTIONAL TESTS:  {Functional tests:24029}  GAIT: Distance walked: *** Assistive device utilized: {Assistive devices:23999} Level of assistance: {Levels of assistance:24026} Comments: ***                                                                                                                                 TREATMENT DATE: ***    PATIENT EDUCATION:  Education details: Discussed eval findings, rehab rationale, aquatic program progression/POC and pools in area. Patient is in agreement  Person educated: Patient Education method: Explanation Education comprehension: verbalized understanding  HOME EXERCISE PROGRAM: ***  ASSESSMENT:  CLINICAL IMPRESSION: Patient is a 53 y.o. f who was seen today for physical therapy evaluation and treatment for ***.   OBJECTIVE IMPAIRMENTS: {opptimpairments:25111}.   ACTIVITY LIMITATIONS: {activitylimitations:27494}  PARTICIPATION LIMITATIONS: {participationrestrictions:25113}  PERSONAL FACTORS: {Personal factors:25162} are also affecting patient's functional outcome.   REHAB POTENTIAL: {rehabpotential:25112}  CLINICAL DECISION MAKING: {clinical decision making:25114}  EVALUATION COMPLEXITY: {Evaluation complexity:25115}   GOALS: Goals reviewed with patient? {yes/no:20286}  SHORT TERM GOALS: Target date: *** Pt will tolerate full aquatic sessions consistently without increase in pain and with improving function to demonstrate good toleration and effectiveness of intervention.  Baseline: Goal status: INITIAL  2.  *** Baseline:  Goal status: INITIAL  3.  *** Baseline:  Goal status: INITIAL  4.  *** Baseline:  Goal status: INITIAL  5.  *** Baseline:  Goal status: INITIAL  6.  *** Baseline:  Goal status: INITIAL  LONG TERM GOALS: Target date: ***  Pt to improve on LEFS by at least 9 point to demonstrate statistically significant Improvement in function. Baseline:  Goal status: INITIAL  2.  *** Baseline:  Goal status: INITIAL  3.  *** Baseline:  Goal status: INITIAL  4.  *** Baseline:  Goal status: INITIAL  5.  *** Baseline:  Goal status: INITIAL  6.  *** Baseline:  Goal status: INITIAL   PLAN:  PT FREQUENCY: {rehab frequency:25116}  PT DURATION: {rehab  duration:25117}  PLANNED INTERVENTIONS: 97164- PT Re-evaluation, 97750- Physical Performance Testing, 97110-Therapeutic exercises, 97530- Therapeutic activity, 97112- Neuromuscular re-education, 97535- Self Care, 02859- Manual therapy, [id number]- Gait training, [id number]- Aquatic Therapy, [phone]- Ionotophoresis 4mg /ml Dexamethasone , 79439 (1-2 muscles), 20561 (3+ muscles)- Dry Needling, Patient/Family education, Balance training, Stair training, Taping, Joint mobilization, DME instructions, Cryotherapy, and Moist heat  PLAN FOR NEXT SESSION: PIERRETTE Shuck [place]) Lilliemae Fruge MPT 09/22/23 12:39 PM [facility] [address] [place], [place], [address] Phone: [phone]   Fax:  [phone]

## 2023-09-23 ENCOUNTER — Ambulatory Visit (HOSPITAL_BASED_OUTPATIENT_CLINIC_OR_DEPARTMENT_OTHER): Attending: Sports Medicine | Admitting: Physical Therapy

## 2023-09-23 ENCOUNTER — Ambulatory Visit (INDEPENDENT_AMBULATORY_CARE_PROVIDER_SITE_OTHER): Admitting: Family Medicine

## 2023-09-23 ENCOUNTER — Encounter: Payer: Self-pay | Admitting: Family Medicine

## 2023-09-23 VITALS — BP 113/68 | HR 63 | Ht 64.0 in | Wt 345.1 lb

## 2023-09-23 DIAGNOSIS — M25531 Pain in right wrist: Secondary | ICD-10-CM | POA: Insufficient documentation

## 2023-09-23 DIAGNOSIS — M5412 Radiculopathy, cervical region: Secondary | ICD-10-CM | POA: Insufficient documentation

## 2023-09-23 DIAGNOSIS — M899 Disorder of bone, unspecified: Secondary | ICD-10-CM | POA: Insufficient documentation

## 2023-09-23 DIAGNOSIS — E119 Type 2 diabetes mellitus without complications: Secondary | ICD-10-CM | POA: Diagnosis not present

## 2023-09-23 DIAGNOSIS — M25551 Pain in right hip: Secondary | ICD-10-CM

## 2023-09-23 DIAGNOSIS — I152 Hypertension secondary to endocrine disorders: Secondary | ICD-10-CM

## 2023-09-23 DIAGNOSIS — M47817 Spondylosis without myelopathy or radiculopathy, lumbosacral region: Secondary | ICD-10-CM | POA: Insufficient documentation

## 2023-09-23 DIAGNOSIS — E1159 Type 2 diabetes mellitus with other circulatory complications: Secondary | ICD-10-CM | POA: Diagnosis not present

## 2023-09-23 DIAGNOSIS — Z23 Encounter for immunization: Secondary | ICD-10-CM | POA: Diagnosis not present

## 2023-09-23 DIAGNOSIS — G8929 Other chronic pain: Secondary | ICD-10-CM

## 2023-09-23 DIAGNOSIS — Z79891 Long term (current) use of opiate analgesic: Secondary | ICD-10-CM | POA: Insufficient documentation

## 2023-09-23 DIAGNOSIS — E1142 Type 2 diabetes mellitus with diabetic polyneuropathy: Secondary | ICD-10-CM | POA: Insufficient documentation

## 2023-09-23 DIAGNOSIS — Z79899 Other long term (current) drug therapy: Secondary | ICD-10-CM | POA: Insufficient documentation

## 2023-09-23 DIAGNOSIS — Z0000 Encounter for general adult medical examination without abnormal findings: Secondary | ICD-10-CM | POA: Diagnosis not present

## 2023-09-23 DIAGNOSIS — M170 Bilateral primary osteoarthritis of knee: Secondary | ICD-10-CM

## 2023-09-23 DIAGNOSIS — Z794 Long term (current) use of insulin: Secondary | ICD-10-CM | POA: Diagnosis not present

## 2023-09-23 DIAGNOSIS — Z789 Other specified health status: Secondary | ICD-10-CM | POA: Insufficient documentation

## 2023-09-23 DIAGNOSIS — L4050 Arthropathic psoriasis, unspecified: Secondary | ICD-10-CM

## 2023-09-23 DIAGNOSIS — M542 Cervicalgia: Secondary | ICD-10-CM | POA: Insufficient documentation

## 2023-09-23 DIAGNOSIS — R937 Abnormal findings on diagnostic imaging of other parts of musculoskeletal system: Secondary | ICD-10-CM | POA: Insufficient documentation

## 2023-09-23 DIAGNOSIS — G894 Chronic pain syndrome: Secondary | ICD-10-CM | POA: Insufficient documentation

## 2023-09-23 LAB — POCT GLYCOSYLATED HEMOGLOBIN (HGB A1C): HbA1c POC (<> result, manual entry): 5.1 % (ref 4.0–5.6)

## 2023-09-23 NOTE — Patient Instructions (Signed)

## 2023-09-23 NOTE — Progress Notes (Addendum)
 Annual physical  Subjective    [name], female    DOB: 07-Feb-1970  Age: 53 y.o. MRN: [id number]  Chief Complaint  Patient presents with   Annual Exam   HPI Elizabeth Patrick is a 53 y.o. old female here  for annual exam.   Subjective - Follow-up for multiple chronic issues. Reports new diagnosis of psoriatic arthritis from rheumatologist, Dr. Jeannetta, after previously being diagnosed with seronegative rheumatoid arthritis for 10-12 years. 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 Denies large scaly lesions on arms, but reports occasional dry skin patches and pustular lesions behind the ears. - Reports severe right hip pain with associated groin pain, present for over 8 months. X-ray of the hip showed grade 4 changes requiring replacement. A cortisone injection by orthopedics (Dr. Burnetta) provided significant relief. The pain is described as a humming sensation when lying down after walking. - Discussed weight loss for surgical clearance for hip and knee replacements. States weight has not changed despite dietary efforts. Reports pain limits ability to exercise. - Considering bariatric surgery, specifically a lap band, and has an upcoming appointment with a bariatric surgeon. Expressed concerns about the sleeve or bypass due to medication absorption issues and seeing negative outcomes in others. - Reports a small scratch on the leg with clear fluid drainage. History of cellulitis in the same leg twice. Monitoring for signs of infection.  Medications Medications discussed include Cosentyx  (completed 4 loading doses last month, now on first monthly maintenance dose), Ozempic  1 mg (being increased to 2 mg), and meloxicam . Has discontinued Actemra  and Plaquenil .  PMH, PSH, FH, Social Hx PMHx: Psoriatic arthritis (new diagnosis), palmoplantar psoriasis, severe right hip osteoarthritis, obesity, history of COVID-19 in May 2025, history of  cellulitis. No prior bariatric surgery. FH: Daughter is autistic. Social Hx: Discussed husband's health, including unemployment, new job, and medical issues (aortic stenosis, cataracts, knee pain).  ROS MSK: Positive for severe right hip and groin pain, knee pain. Negative for large scaly psoriatic lesions on arms. SKIN: Positive for palmoplantar pustular psoriasis, small scratch with clear drainage on leg. Denies redness or other signs of infection currently. CONSTITUTIONAL: Denies fever. Reports significant pain with ambulation.   The 10-year ASCVD risk score (Arnett DK, et al., 2019) is: 2.3%  Health Maintenance Due  Topic Date Due   DTaP/Tdap/Td (1 - Tdap) Never done   Zoster Vaccines- Shingrix (1 of 2) Never done   Pneumococcal Vaccine: 50+ Years (2 of 2 - PPSV23, PCV20, or PCV21) 06/23/2014   Hepatitis B Vaccines 19-59 Average Risk (2 of 3 - 19+ 3-dose series) 09/10/2021   COVID-19 Vaccine (4 - 2025-26 season) 09/15/2023      Objective:     BP 113/68   Pulse 63   Ht 5' 4 (1.626 m)   Wt (!) 345 lb 1.9 oz (156.5 kg)   SpO2 98%   BMI 59.24 kg/m    Physical Exam Gen: alert, oriented Heent: perrla, eomi Cv:rrr, no murmur Pulm: lctab Gi: soft, large pannus. Nbs Msk: antalgic gait Skin: warm and dry.  Psych: pleasant affect      Assessment & Plan:   Physical exam, annual  Type 2 diabetes mellitus without complication, with long-term current use of insulin  (HCC) -     POCT glycosylated hemoglobin (Hb A1C) -     TSH -     Microalbumin / creatinine urine ratio  Hypertension associated with diabetes (HCC) -  POCT glycosylated hemoglobin (Hb A1C) -     Microalbumin / creatinine urine ratio  Flu vaccine need -     Flu vaccine trivalent PF, 6mos and older(Flulaval,Afluria,Fluarix,Fluzone)  Psoriatic arthritis (HCC) Assessment & Plan: - Diagnosis recently changed from seronegative rheumatoid arthritis by rheumatologist. Palmoplantar psoriasis has improved  significantly with new treatment. - Continue Cosentyx  as prescribed by rheumatology.   Chronic hip pain (Right) Assessment & Plan: - Grade 4 changes on x-ray, confirmed by orthopedics. Requires total hip arthroplasty. A recent intra-articular cortisone injection provided significant pain relief. Pain is severe and limits mobility. - Continue meloxicam  for pain. - Awaiting surgical clearance, which is dependent on weight loss.   Morbid obesity (HCC) Assessment & Plan: - Difficulty with weight loss despite diet and medication. Limited by pain with exercise. Considering bariatric surgery to meet weight requirements for orthopedic surgeries. Currently on Ozempic  1 mg. - Increase Ozempic  to 2 mg daily. - If no significant weight loss on max dose of Ozempic , will consider switching to tirzepatide (Mounjaro/Zepbound). - Continue with bariatric surgery evaluation process.   Healthcare maintenance Assessment & Plan: - Needs labs for annual physical. Multiple labs already ordered by pain management for an appointment tomorrow. - Mammogram scheduled for this month. - GYN appointment scheduled for next month. - Labs for TSH will be drawn today. - Will receive influenza vaccine today. - Counseled to identify a pharmacy for the COVID-19 vaccine. A prescription for the specific brand carried by the chosen pharmacy will be provided.   Osteoarthritis of knees (Bilateral) Assessment & Plan: Pt has severe osteoarthritis of the knees and hips.  This impairs her ambulation significantly.  Typically ambulates with a walker.  Has difficulty getting up from seated position. Her gait is slowed.   Pt would benefit from home chair lift to help her get up and down from seated position.      Addended to add information regarding her knee osteoarthritis  Return in about 3 months (around 12/23/2023) for DM, weight.    Elizabeth MARLA Slain, MD

## 2023-09-23 NOTE — Progress Notes (Unsigned)
 PROVIDER NOTE: Interpretation of information contained herein should be left to medically-trained personnel. Specific patient instructions are provided elsewhere under Patient Instructions section of medical record. This document was created in part using AI and STT-dictation technology, any transcriptional errors that may result from this process are unintentional.  Patient: Elizabeth Patrick  Service: E/M Encounter  Provider: Eric DELENA Como, MD  DOB: September 27, 1970  Delivery: Face-to-face  Specialty: Interventional Pain Management  MRN: [id number]  Setting: Ambulatory outpatient facility  Specialty designation: 09  Type: New Patient  Location: Outpatient office facility  PCP: Chandra Toribio POUR, MD  DOS: 09/24/2023    Referring Prov.: Chandra Toribio POUR, MD   Primary Reason(s) for Visit: Encounter for initial evaluation of one or more chronic problems (new to examiner) potentially causing chronic pain, and posing a threat to normal musculoskeletal function. (Level of risk: High) CC: No chief complaint on file.  HPI  Elizabeth Patrick is a 53 y.o. year old, female patient, who comes for the first time to our practice referred by Chandra Toribio POUR, MD for our initial evaluation of her chronic pain. She has Rheumatoid arthritis (HCC); Hypertension associated with diabetes (HCC); Osteoarthritis; NASH (nonalcoholic steatohepatitis); Hyperlipidemia associated with type 2 diabetes mellitus (HCC); Bilateral lower extremity edema; Recurrent UTI; Type 2 diabetes mellitus without complication, with long-term current use of insulin  (HCC); Gastroesophageal reflux disease; Osteoarthritis of both knees; Morbid obesity (HCC); Menopause; Loud snoring; Depression, major, single episode, moderate (HCC); Psoriasis and similar disorder; Severe sleep apnea; Vitamin B12 deficiency; Diabetic polyneuropathy associated with type 2 diabetes mellitus (HCC); Right hip pain; DDD (degenerative disc disease), lumbosacral; Recurrent cold sores;  Gastroparesis; Chronic idiopathic constipation; Intertrigo; High risk medication use; Vitamin D  deficiency; Screening for tuberculosis; Dermatitis due to sun; Chronic pain syndrome; Pharmacologic therapy; Disorder of skeletal system; Problems influencing health status; Cervical radiculopathy; Long term (current) use of opiate analgesic; Neck pain; Primary osteoarthritis of both knees; Wrist pain, right; Lumbosacral spondylosis without myelopathy; Polyneuropathy due to type 2 diabetes mellitus (HCC); Encounter for long-term opiate analgesic use; and Abnormal MRI, cervical spine (06/18/2022) on their problem list. Today she comes in for evaluation of her No chief complaint on file.  Pain Assessment: Location:     Radiating:   Onset:   Duration:   Quality:   Severity:  /10 (subjective, self-reported pain score)  Effect on ADL:   Timing:   Modifying factors:   BP:    HR:    Onset and Duration: {Hx; Onset and Duration:210120511} Cause of pain: {Hx; Cause:210120521} Severity: {Pain Severity:210120502} Timing: {Symptoms; Timing:210120501} Aggravating Factors: {Causes; Aggravating pain factors:210120507} Alleviating Factors: {Causes; Alleviating Factors:210120500} Associated Problems: {Hx; Associated problems:210120515} Quality of Pain: {Hx; Symptom quality or Descriptor:210120531} Previous Examinations or Tests: {Hx; Previous examinations or test:210120529} Previous Treatments: {Hx; Previous Treatment:210120503}  Elizabeth Patrick is being evaluated for possible interventional pain management therapies for the treatment of her chronic pain.  Discussed the use of AI scribe software for clinical note transcription with the patient, who gave verbal consent to proceed.  History of Present Illness            Elizabeth Patrick has been informed that this initial visit was an evaluation only.  On the follow up appointment I will go over the results, including ordered tests and available interventional  therapies. At that time she will have the opportunity to decide whether to proceed with offered therapies or not. In the event that Elizabeth Patrick prefers avoiding interventional options, this will conclude our involvement in  the case.  Medication management recommendations may be provided upon request.  Patient informed that diagnostic tests may be ordered to assist in identifying underlying causes, narrow the list of differential diagnoses and aid in determining candidacy for (or contraindications to) planned therapeutic interventions.  Historic Controlled Substance Pharmacotherapy Review PMP and historical list of controlled substances: Pregabalin (Lyrica) 225 mg capsule, 1 cap p.o. 3 times daily (675 mg/day) (90/month) (last filled on 09/08/2023); hydrocodone/APAP 5/325, 1 tab p.o. 3 times daily (# 30) (last filled on 07/08/2023); oxycodone  IR 5 mg tablet, 1 tab p.o. 4 times daily (# 28) (last filled on 02/13/2023) Most recently prescribed controlled substance(s): Opioid Analgesic: None MME/day: 0 mg/day  Historical Monitoring: The patient  reports current drug use. Drug: Hydrocodone. List of prior UDS Testing: No results found for: MDMA, COCAINSCRNUR, PCPSCRNUR, PCPQUANT, CANNABQUANT, THCU, ETH, CBDTHCR, D8THCCBX, D9THCCBX Historical Background Evaluation: [place] PMP: PDMP reviewed during this encounter. Review of the past 72-months conducted.             PMP NARX Score Report:  Narcotic: 260 Sedative: 421 Stimulant: 000 [place] Department of public safety, offender search: Engineer, mining Information) Non-contributory Risk Assessment Profile: Aberrant behavior: None observed or detected today Risk factors for fatal opioid overdose: None identified today PMP NARX Overdose Risk Score: 310 Fatal overdose hazard ratio (HR): Calculation deferred Non-fatal overdose hazard ratio (HR): Calculation deferred Risk of opioid abuse or dependence: 0.7-3.0% with doses <= 36 MME/day and 6.1-26% with  doses >= 120 MME/day. Substance use disorder (SUD) risk level: See below Personal History of Substance Abuse (SUD-Substance use disorder):  Alcohol:    Illegal Drugs:    Rx Drugs:    ORT Risk Level calculation:    ORT Scoring interpretation table:  Score <3 = Low Risk for SUD  Score between 4-7 = Moderate Risk for SUD  Score >8 = High Risk for Opioid Abuse   PHQ-2 Depression Scale:  Total score:    PHQ-2 Scoring interpretation table: (Score and probability of major depressive disorder)  Score 0 = No depression  Score 1 = 15.4% Probability  Score 2 = 21.1% Probability  Score 3 = 38.4% Probability  Score 4 = 45.5% Probability  Score 5 = 56.4% Probability  Score 6 = 78.6% Probability   PHQ-9 Depression Scale:  Total score:    PHQ-9 Scoring interpretation table:  Score 0-4 = No depression  Score 5-9 = Mild depression  Score 10-14 = Moderate depression  Score 15-19 = Moderately severe depression  Score 20-27 = Severe depression (2.4 times higher risk of SUD and 2.89 times higher risk of overuse)   Pharmacologic Plan: As per protocol, I have not taken over any controlled substance management, pending the results of ordered tests and/or consults.            Initial impression: Pending review of available data and ordered tests.  Meds   Current Outpatient Medications:    acetaminophen  (TYLENOL ) 325 MG tablet, Take 975 mg by mouth every 6 (six) hours as needed for moderate pain (pain score 4-6)., Disp: , Rfl:    amLODipine  (NORVASC ) 10 MG tablet, TAKE 1 TABLET BY MOUTH DAILY, Disp: 90 tablet, Rfl: 3   atorvastatin  (LIPITOR) 10 MG tablet, TAKE 1 TABLET(10 MG) BY MOUTH DAILY, Disp: 90 tablet, Rfl: 3   benzonatate  (TESSALON ) 200 MG capsule, Take 1 capsule (200 mg total) by mouth 2 (two) times daily as needed for cough. (Patient not taking: Reported on 08/27/2023), Disp: 20 capsule, Rfl: 0  Blood Pressure Monitor DEVI, 1 Device by Does not apply route daily. (Patient not taking:  Reported on 07/10/2023), Disp: 1 each, Rfl: 0   clobetasol  cream (TEMOVATE ) 0.05 %, Apply 1 application. topically 2 (two) times daily., Disp: 60 g, Rfl: 3   clotrimazole  (LOTRIMIN ) 1 % cream, Apply 1 Application topically 2 (two) times daily. (Patient not taking: Reported on 07/10/2023), Disp: 60 g, Rfl: 1   Cranberry 1000 MG CAPS, Take 1,000 mg by mouth daily., Disp: , Rfl:    Cyanocobalamin  (B-12 PO), Take 1 capsule by mouth daily., Disp: , Rfl:    esomeprazole  (NEXIUM ) 40 MG capsule, Take 1 capsule (40 mg total) by mouth daily., Disp: 90 capsule, Rfl: 3   estradiol  (VIVELLE -DOT) 0.0375 MG/24HR, Place 1 patch onto the skin 2 (two) times a week., Disp: 24 patch, Rfl: 3   fenofibrate  (TRICOR ) 145 MG tablet, TAKE 1 TABLET BY MOUTH EVERY DAY WITH DINNER, Disp: 90 tablet, Rfl: 3   hydrochlorothiazide  (HYDRODIURIL ) 12.5 MG tablet, Take 1 tablet (12.5 mg total) by mouth daily., Disp: 90 tablet, Rfl: 3   HYDROcodone-acetaminophen  (NORCO/VICODIN) 5-325 MG tablet, Take 1 tablet by mouth every 8 (eight) hours as needed., Disp: , Rfl:    Lidocaine  (BLUE-EMU PAIN RELIEF DRY EX), Apply 1 Application topically daily as needed (pain)., Disp: , Rfl:    losartan  (COZAAR ) 100 MG tablet, TAKE 1 TABLET(100 MG) BY MOUTH DAILY, Disp: 90 tablet, Rfl: 1   meloxicam  (MOBIC ) 15 MG tablet, Take 1 tablet (15 mg total) by mouth daily., Disp: 30 tablet, Rfl: 1   metoprolol  succinate (TOPROL -XL) 25 MG 24 hr tablet, Take 1 tablet (25 mg total) by mouth daily., Disp: 90 tablet, Rfl: 3   Multiple Vitamin (MULTIVITAMIN PO), Take by mouth., Disp: , Rfl:    mupirocin  ointment (BACTROBAN ) 2 %, Apply 1 Application topically 2 (two) times daily. (Patient not taking: Reported on 08/27/2023), Disp: 30 g, Rfl: 1   nystatin  cream (MYCOSTATIN ), Apply 1 Application topically 2 (two) times daily. (Patient not taking: Reported on 08/27/2023), Disp: 30 g, Rfl: 0   pregabalin (LYRICA) 225 MG capsule, Take 225 mg by mouth 3 (three) times daily., Disp:  , Rfl:    progesterone  (PROMETRIUM ) 100 MG capsule, Take 1 capsule (100 mg total) by mouth daily., Disp: 90 capsule, Rfl: 3   Secukinumab  (COSENTYX  UNOREADY) 300 MG/2ML SOAJ, Inject 300 mg into the skin every 28 (twenty-eight) days. **ALL REFILLS NEED TO BE ESCRIBED to [facility] PT ASSISTANCE**, Disp: 6 mL, Rfl: 0   Semaglutide , 2 MG/DOSE, 8 MG/3ML SOPN, Inject 2 mg as directed once a week., Disp: 3 mL, Rfl: 2   tiZANidine (ZANAFLEX) 2 MG tablet, Take 2 mg by mouth every 6 (six) hours as needed for muscle spasms., Disp: , Rfl:   Imaging Review  Cervical Imaging: Cervical MR wo contrast: Results for orders placed during the hospital encounter of 06/09/22 MR CERVICAL SPINE WO CONTRAST  Narrative CLINICAL DATA:  Neck pain radiating to the bilateral hands for 7 months.  EXAM: MRI CERVICAL SPINE WITHOUT CONTRAST  TECHNIQUE: Multiplanar, multisequence MR imaging of the cervical spine was performed. No intravenous contrast was administered.  COMPARISON:  None Available.  FINDINGS: Alignment: Straightening of cervical lordosis  Vertebrae: No fracture, evidence of discitis, or bone lesion.  Cord: Normal signal and morphology.  Posterior Fossa, vertebral arteries, paraspinal tissues: Negative.  Disc levels:  C1-2: Tiny left posterolateral ganglion is likely in the ventral subarachnoid space.  C2-3: Degenerative facet spurring asymmetric to the  left. Patent canal and foramina  C3-4: Moderate degenerative facet spurring.  C4-5: Bulky degenerative facet spurring on the right. Mild right foraminal narrowing  C5-6: Bulky degenerative facet spurring especially on the left. Mild left foraminal narrowing  C6-7: Disc height loss with uncovertebral ridging and right foraminal protrusion. Advanced right and mild-to-moderate left foraminal narrowing  C7-T1:Mild facet spurring.  IMPRESSION: 1. Multilevel cervical spine degeneration especially affecting facets and the C6-7 disc. 2.  Focal right foraminal impingement at C6-7. Mild to moderate foraminal narrowing on the left at the same level. 3. Widely patent spinal canal.   Electronically Signed By: Dorn Roulette M.D. On: 06/18/2022 04:06  Lumbosacral Imaging: Lumbar DG (Complete) 4+V: Results for orders placed during the hospital encounter of 09/19/20 DG Lumbar Spine Complete  Narrative CLINICAL DATA:  Low back pain  EXAM: LUMBAR SPINE - COMPLETE 4+ VIEW  COMPARISON:  None.  FINDINGS: There are 5 non-rib-bearing lumbar type vertebral bodies with rudimentary ribs arising from the lowest rib-bearing vertebral body. Vertebral body heights are preserved. Alignment is normal, with no evidence of antero or retrolisthesis. There is no spondylolysis.  There is mild multilevel disc space narrowing. There is multilevel degenerative endplate change, most advanced at L1-L2. Facet arthropathy is most advanced at L5-S1.  The soft tissues are unremarkable.  IMPRESSION: Multilevel degenerative changes as above; facet arthropathy is most advanced at L5-S1.   Electronically Signed By: Maude Harry M.D. On: 09/20/2020 14:41  Knee Imaging: Knee-R DG 4 views: Results for orders placed during the hospital encounter of 09/19/20 DG Knee Complete 4 Views Right  Narrative CLINICAL DATA:  Right knee pain.  EXAM: RIGHT KNEE - COMPLETE 4+ VIEW  COMPARISON:  No prior.  FINDINGS: Severe diffuse tricompartment degenerative change. Loose bodies noted. Fracture of the superior aspect of the patella. This may be old. No acute abnormality otherwise noted.  IMPRESSION: 1. Severe diffuse tricompartment degenerative change. Loose bodies noted.  2. Fracture of the superior aspect of the patella. This may be old.   Electronically Signed By: Debby  Register M.D. On: 09/21/2020 07:05  Knee-L DG 4 views: Results for orders placed during the hospital encounter of 09/19/20 DG Knee Complete 4 Views  Left  Narrative CLINICAL DATA:  Left knee pain.  EXAM: LEFT KNEE - COMPLETE 4+ VIEW  COMPARISON:  No prior.  FINDINGS: Severe diffuse tricompartment degenerative change. No acute bony or joint abnormality identified. No evidence of fracture or dislocation.  IMPRESSION: Severe diffuse tricompartment degenerative change. No acute abnormality identified.   Electronically Signed By: Debby  Register M.D. On: 09/21/2020 07:06  Complexity Note: Imaging results reviewed.                         ROS  Cardiovascular: {Hx; Cardiovascular History:210120525} Pulmonary or Respiratory: {Hx; Pumonary and/or Respiratory History:210120523} Neurological: {Hx; Neurological:210120504} Psychological-Psychiatric: {Hx; Psychological-Psychiatric History:210120512} Gastrointestinal: {Hx; Gastrointestinal:210120527} Genitourinary: {Hx; Genitourinary:210120506} Hematological: {Hx; Hematological:210120510} Endocrine: {Hx; Endocrine history:210120509} Rheumatologic: {Hx; Rheumatological:210120530} Musculoskeletal: {Hx; Musculoskeletal:210120528} Work History: {Hx; Work history:210120514}  Allergies  Elizabeth Patrick is allergic to alcohol, dilantin [phenytoin], and methotrexate.  Laboratory Chemistry Profile   Renal Lab Results  Component Value Date   BUN 20 06/25/2023   CREATININE 0.88 06/25/2023   BCR SEE NOTE: 06/25/2023   GFRNONAA >60 02/13/2023   SPECGRAV >=1.030 (A) 05/22/2021   PHUR 5.5 05/22/2021   PROTEINUR TRACE (A) 10/16/2021     Electrolytes Lab Results  Component Value Date   NA 142 06/25/2023  K 4.1 06/25/2023   CL 104 06/25/2023   CALCIUM  10.0 06/25/2023     Hepatic Lab Results  Component Value Date   AST 23 06/25/2023   ALT 24 06/25/2023   ALBUMIN 4.5 10/15/2022   ALKPHOS 37 (L) 10/15/2022     ID Lab Results  Component Value Date   HIV NON-REACTIVE 08/15/2022     Bone Lab Results  Component Value Date   VD25OH 61 04/08/2023     Endocrine Lab  Results  Component Value Date   GLUCOSE 97 06/25/2023   GLUCOSEU NEGATIVE 10/16/2021   HGBA1C 5.2 12/16/2022   TSH 0.895 04/16/2021   FREET4 1.57 04/16/2021   ESTRADIOL  18 04/16/2021     Neuropathy Lab Results  Component Value Date   VITAMINB12 789 07/23/2021   FOLATE 16.0 07/23/2021   HGBA1C 5.2 12/16/2022   HIV NON-REACTIVE 08/15/2022     CNS No results found for: COLORCSF, APPEARCSF, RBCCOUNTCSF, WBCCSF, POLYSCSF, LYMPHSCSF, EOSCSF, PROTEINCSF, GLUCCSF, JCVIRUS, CSFOLI, IGGCSF, LABACHR, ACETBL   Inflammation (CRP: Acute  ESR: Chronic) Lab Results  Component Value Date   CRP <3.0 06/25/2023   ESRSEDRATE 2 06/25/2023     Rheumatology Lab Results  Component Value Date   RF <10 04/08/2023   ANA Negative 12/12/2020     Coagulation Lab Results  Component Value Date   PLT 175 06/25/2023     Cardiovascular Lab Results  Component Value Date   CKTOTAL 62 12/12/2020   HGB 14.5 06/25/2023   HCT 43.2 06/25/2023     Screening Lab Results  Component Value Date   HIV NON-REACTIVE 08/15/2022     Cancer No results found for: CEA, CA125, LABCA2   Allergens No results found for: ALMOND, APPLE, ASPARAGUS, AVOCADO, BANANA, BARLEY, BASIL, BAYLEAF, GREENBEAN, LIMABEAN, WHITEBEAN, BEEFIGE, REDBEET, BLUEBERRY, BROCCOLI, CABBAGE, MELON, CARROT, CASEIN, CASHEWNUT, CAULIFLOWER, CELERY     Note: Lab results reviewed.  PFSH  Drug: Elizabeth Patrick  reports current drug use. Drug: Hydrocodone. Alcohol:  reports that she does not currently use alcohol. Tobacco:  reports that she quit smoking about 18 years ago. Her smoking use included cigarettes. She started smoking about 28 years ago. She has a 10 pack-year smoking history. She has been exposed to tobacco smoke. She has never used smokeless tobacco. Medical:  has a past medical history of Abnormal uterine bleeding, Anemia, Blood transfusion without reported  diagnosis, Complication of anesthesia, Diabetes mellitus without complication (HCC), Dysmenorrhea, Endometriosis, Fibroid, Genital warts, GERD (gastroesophageal reflux disease), Hypertension, PONV (postoperative nausea and vomiting), RA (rheumatoid arthritis) (HCC), and Radiculopathy. Family: family history includes Bipolar disorder in her mother; Breast cancer (age of onset: 38) in her maternal grandmother; Coronary artery disease in her mother; Heart attack in her father; Hypertension in her father; Multiple sclerosis in her mother; Obesity in her mother; Vascular Disease in her father.  Past Surgical History:  Procedure Laterality Date   ABDOMINAL HYSTERECTOMY     CARPOMETACARPEL SUSPENSION PLASTY Left 02/13/2023   Procedure: Left trapeziectomy with ligament reconstruction and tendon interposition;  Surgeon: Romona Harari, MD;  Location: [facility] OR;  Service: Orthopedics;  Laterality: Left;  regional, he can start earlier if need be to fit   CLOSED REDUCTION FINGER WITH PERCUTANEOUS PINNING Left 02/13/2023   Procedure: possible percutaneous pinning of thumb MCP join;  Surgeon: Romona Harari, MD;  Location: [facility] OR;  Service: Orthopedics;  Laterality: Left;  regional he can start earlier if need be to fit   COLONOSCOPY WITH PROPOFOL  N/A 06/26/2022   Procedure:  COLONOSCOPY WITH PROPOFOL ;  Surgeon: Therisa Bi, MD;  Location: [facility] ENDOSCOPY;  Service: Gastroenterology;  Laterality: N/A;  Patient informed the office that she wakes from anesthesia combative and pulliing at tubes.  She wanted us  to be aware.   ESOPHAGOGASTRODUODENOSCOPY (EGD) WITH PROPOFOL  N/A 06/26/2022   Procedure: ESOPHAGOGASTRODUODENOSCOPY (EGD) WITH PROPOFOL ;  Surgeon: Therisa Bi, MD;  Location: [facility] ENDOSCOPY;  Service: Gastroenterology;  Laterality: N/A;   PARATHYROIDECTOMY     Active Ambulatory Problems    Diagnosis Date Noted   Rheumatoid arthritis (HCC) 09/26/2020   Hypertension associated with diabetes (HCC) 09/26/2020    Osteoarthritis 09/26/2020   NASH (nonalcoholic steatohepatitis) 10/10/2020   Hyperlipidemia associated with type 2 diabetes mellitus (HCC) 10/11/2020   Bilateral lower extremity edema 10/11/2020   Recurrent UTI 10/17/2020   Type 2 diabetes mellitus without complication, with long-term current use of insulin  (HCC) 11/28/2020   Gastroesophageal reflux disease 11/28/2020   Osteoarthritis of both knees 04/04/2021   Morbid obesity (HCC) 04/05/2021   Menopause 04/16/2021   Loud snoring 04/17/2021   Depression, major, single episode, moderate (HCC) 04/29/2021   Psoriasis and similar disorder 05/27/2021   Severe sleep apnea 07/04/2021   Vitamin B12 deficiency 07/23/2021   Diabetic polyneuropathy associated with type 2 diabetes mellitus (HCC) 10/11/2020   Right hip pain 12/15/2021   DDD (degenerative disc disease), lumbosacral 12/15/2021   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/2021   Gastroparesis 06/26/2022   Chronic idiopathic constipation 09/06/2022   Intertrigo 12/16/2022   High risk medication use 04/08/2023   Vitamin D  deficiency 04/08/2023   Screening for tuberculosis 04/08/2023   Dermatitis due to sun 06/23/2023   Chronic pain syndrome 09/23/2023   Pharmacologic therapy 09/23/2023   Disorder of skeletal system 09/23/2023   Problems influencing health status 09/23/2023   Cervical radiculopathy 09/23/2023   Long term (current) use of opiate analgesic 09/23/2023   Neck pain 09/23/2023   Primary osteoarthritis of both knees 09/23/2023   Wrist pain, right 09/23/2023   Lumbosacral spondylosis without myelopathy 09/23/2023   Polyneuropathy due to type 2 diabetes mellitus (HCC) 09/23/2023   Encounter for long-term opiate analgesic use 09/23/2023   Abnormal MRI, cervical spine (06/18/2022) 09/23/2023   Resolved Ambulatory Problems    Diagnosis Date Noted   Colon cancer screening 10/10/2020   Screening for endocrine, nutritional, metabolic and immunity disorder 10/10/2020   PAD (peripheral  artery disease) (HCC) 10/11/2020   Dysuria 10/17/2020   Absence of bladder continence 10/17/2020   Need for influenza vaccination 11/28/2020   Diabetes mellitus without complication (HCC) 03/05/2021   Prediabetes 05/02/2021   Obesity (BMI 35.0-39.9 without comorbidity) 12/15/2021   Past Medical History:  Diagnosis Date   Abnormal uterine bleeding    Anemia    Blood transfusion without reported diagnosis    Complication of anesthesia    Dysmenorrhea    Endometriosis    Fibroid    Genital warts    GERD (gastroesophageal reflux disease)    Hypertension    PONV (postoperative nausea and vomiting)    RA (rheumatoid arthritis) (HCC)    Radiculopathy    Constitutional Exam  General appearance: Well nourished, well developed, and well hydrated. In no apparent acute distress There were no vitals filed for this visit. BMI Assessment: Estimated body mass index is 60.25 kg/m as calculated from the following:   Height as of 06/25/23: 5' 4 (1.626 m).   Weight as of 06/25/23: 351 lb (159.2 kg).  BMI interpretation table: BMI level Category Range association with higher incidence of  chronic pain  <18 kg/m2 Underweight   18.5-24.9 kg/m2 Ideal body weight   25-29.9 kg/m2 Overweight Increased incidence by 20%  30-34.9 kg/m2 Obese (Class I) Increased incidence by 68%  35-39.9 kg/m2 Severe obesity (Class II) Increased incidence by 136%  >40 kg/m2 Extreme obesity (Class III) Increased incidence by 254%   Patient's current BMI Ideal Body weight  There is no height or weight on file to calculate BMI. Patient weight not recorded   BMI Readings from Last 4 Encounters:  06/25/23 60.25 kg/m  06/17/23 59.95 kg/m  05/29/23 60.56 kg/m  05/15/23 57.76 kg/m   Wt Readings from Last 4 Encounters:  06/25/23 (!) 351 lb (159.2 kg)  06/17/23 (!) 349 lb 4 oz (158.4 kg)  05/29/23 (!) 352 lb 12.8 oz (160 kg)  05/15/23 (!) 347 lb 1.9 oz (157.5 kg)    Psych/Mental status: Alert, oriented x 3 (person,  place, & time)       Eyes: PERLA Respiratory: No evidence of acute respiratory distress  Assessment  Primary Diagnosis & Pertinent Problem List: The primary encounter diagnosis was Chronic pain syndrome. Diagnoses of Pharmacologic therapy, Disorder of skeletal system, and Problems influencing health status were also pertinent to this visit.  Visit Diagnosis (New problems to examiner): 1. Chronic pain syndrome   2. Pharmacologic therapy   3. Disorder of skeletal system   4. Problems influencing health status    Plan of Care (Initial workup plan)  Note: Elizabeth Patrick was reminded that as per protocol, today's visit has been an evaluation only. We have not taken over the patient's controlled substance management.  Problem-specific plan: Assessment and Plan            Lab Orders  No laboratory test(s) ordered today   Imaging Orders  No imaging studies ordered today   Referral Orders  No referral(s) requested today   Procedure Orders    No procedure(s) ordered today   Pharmacotherapy (current): Medications ordered:  No orders of the defined types were placed in this encounter.  Medications administered during this visit: Elizabeth Patrick had no medications administered during this visit.   Analgesic Pharmacotherapy:  Opioid Analgesics: For patients currently taking or requesting to take opioid analgesics, in accordance with [place]  Medical Board Guidelines, we will assess their risks and indications for the use of these substances. After completing our evaluation, we may offer recommendations, but we no longer take patients for medication management. The prescribing physician will ultimately decide, based on his/her training and level of comfort whether to adopt any of the recommendations, including whether or not to prescribe such medicines.  Membrane stabilizer: To be determined at a later time  Muscle relaxant: To be determined at a later time  NSAID: To be  determined at a later time  Other analgesic(s): To be determined at a later time   Interventional management options: Elizabeth Patrick was informed that there is no guarantee that she would be a candidate for interventional therapies. The decision will be based on the results of diagnostic studies, as well as Elizabeth Patrick's risk profile.  Procedure(s) under consideration:  Pending results of ordered studies     Interventional Therapies  Risk Factors  Considerations  Medical Comorbidities:     Planned  Pending:      Under consideration:   Pending   Completed: (Analgesic benefit)1  None at this time   Therapeutic  Palliative (PRN) options:   None established   Completed by other providers:   Patient  of [facility])  Procedure Date Performed Result  Cervical/Thoracic - single, epidural injection 10/08/2022 no relief  RFA, Lumbar (Check blood thinner status) 10/11/2022 N/A  Lumbar or Sacral - single, destruction nerve 10/23/2022 90% on left, worse on right  Lumbar or Sacral - additional, destruction 10/23/2022 N/A  Joint Injection 12/11/2022 N/A  Major joint or bursa injection 12/16/2022 N/A  ESI, Cervical Interlaminar (Check blood thinner status) 03/18/2023 N/A  Joint Injection 03/18/2023 N/A  Cervical/Thoracic - single, epidural injection 04/15/2023 25% Relief Ongoing  RFA, Lumbar (Check blood thinner status) 05/13/2023 06/03/23 Blue Medicare  Lumbar or Sacral - single, destruction nerve 06/03/2023 90% Relief Ongoing  Joint Injection 07/30/2023 *blue medicare 08/06/23    1(Analgesic benefit): Expressed in percentage (%). (Local anesthetic[LA] +/- sedation  L.A.Local Anesthetic  Steroid benefit  Ongoing benefit)   Provider-requested follow-up: No follow-ups on file.  Future Appointments  Date Time Provider Department Center  09/23/2023  9:30 AM Chandra Toribio POUR, MD [facility]  09/23/2023  3:30 PM Zenaida Ronal FALCON, PT [facility] [id number]   09/24/2023  9:00 AM Tanya Glisson, MD [facility] None  09/29/2023 11:20 AM Jeannetta Lonni ORN, MD [facility] None  10/09/2023 12:00 PM [facility] MM 3 [facility]  10/21/2023  8:30 AM Cathlyn JAYSON Nikki Bobie FORBES, MD [facility] None  03/01/2024 11:45 AM Orman Erminio POUR, PA-C [facility] None  08/19/2024 10:10 AM [facility]-ANNUAL WELLNESS VISIT [facility]   I discussed the assessment and treatment plan with the patient. The patient was provided an opportunity to ask questions and all were answered. The patient agreed with the plan and demonstrated an understanding of the instructions.  Patient advised to call back or seek an in-person evaluation if the symptoms or condition worsens.  Duration of encounter: *** minutes.  Total time on encounter, as per AMA guidelines included both the face-to-face and non-face-to-face time personally spent by the physician and/or other qualified health care professional(s) on the day of the encounter (includes time in activities that require the physician or other qualified health care professional and does not include time in activities normally performed by clinical staff). Physician's time may include the following activities when performed: Preparing to see the patient (e.g., pre-charting review of records, searching for previously ordered imaging, lab work, and nerve conduction tests) Review of prior analgesic pharmacotherapies. Reviewing PMP Interpreting ordered tests (e.g., lab work, imaging, nerve conduction tests) Performing post-procedure evaluations, including interpretation of diagnostic procedures Obtaining and/or reviewing separately obtained history Performing a medically appropriate examination and/or evaluation Counseling and educating the patient/family/caregiver Ordering medications, tests, or procedures Referring and communicating with other health care professionals (when not separately reported) Documenting clinical information in the  electronic or other health record Independently interpreting results (not separately reported) and communicating results to the patient/ family/caregiver Care coordination (not separately reported)  Note by: Glisson DELENA Tanya, MD (TTS and AI technology used. I apologize for any typographical errors that were not detected and corrected.) Date: 09/24/2023; Time: 8:09 AM

## 2023-09-23 NOTE — Patient Instructions (Signed)
 It was nice to see you today,  We addressed the following topics today: -We will increase your Ozempic  to 2 mg. - If you still do not feel like it is helping we will switch you to Mounjaro. - Find out which pharmacy when you use for the COVID-vaccine.  You can also ask them what brand the vaccine they use.  We can then send in a prescription for it.   Have a great day,  Rolan Slain, MD

## 2023-09-24 ENCOUNTER — Encounter: Payer: Self-pay | Admitting: Pain Medicine

## 2023-09-24 ENCOUNTER — Ambulatory Visit: Attending: Pain Medicine | Admitting: Pain Medicine

## 2023-09-24 VITALS — BP 149/79 | HR 72 | Temp 97.2°F | Resp 16 | Ht 64.0 in | Wt 346.0 lb

## 2023-09-24 DIAGNOSIS — M545 Low back pain, unspecified: Secondary | ICD-10-CM | POA: Diagnosis not present

## 2023-09-24 DIAGNOSIS — M25562 Pain in left knee: Secondary | ICD-10-CM | POA: Diagnosis not present

## 2023-09-24 DIAGNOSIS — M25561 Pain in right knee: Secondary | ICD-10-CM | POA: Diagnosis not present

## 2023-09-24 DIAGNOSIS — M25551 Pain in right hip: Secondary | ICD-10-CM | POA: Insufficient documentation

## 2023-09-24 DIAGNOSIS — G8929 Other chronic pain: Secondary | ICD-10-CM | POA: Insufficient documentation

## 2023-09-24 DIAGNOSIS — G894 Chronic pain syndrome: Secondary | ICD-10-CM | POA: Diagnosis not present

## 2023-09-24 DIAGNOSIS — M899 Disorder of bone, unspecified: Secondary | ICD-10-CM | POA: Diagnosis not present

## 2023-09-24 DIAGNOSIS — M542 Cervicalgia: Secondary | ICD-10-CM | POA: Diagnosis not present

## 2023-09-24 DIAGNOSIS — G4486 Cervicogenic headache: Secondary | ICD-10-CM | POA: Diagnosis not present

## 2023-09-24 DIAGNOSIS — M170 Bilateral primary osteoarthritis of knee: Secondary | ICD-10-CM | POA: Diagnosis not present

## 2023-09-24 DIAGNOSIS — Z79899 Other long term (current) drug therapy: Secondary | ICD-10-CM | POA: Insufficient documentation

## 2023-09-24 DIAGNOSIS — Z789 Other specified health status: Secondary | ICD-10-CM | POA: Insufficient documentation

## 2023-09-24 DIAGNOSIS — M150 Primary generalized (osteo)arthritis: Secondary | ICD-10-CM | POA: Diagnosis not present

## 2023-09-24 LAB — MICROALBUMIN / CREATININE URINE RATIO
Creatinine, Urine: 149.6 mg/dL
Microalb/Creat Ratio: 6 mg/g{creat} (ref 0–29)
Microalbumin, Urine: 8.6 ug/mL

## 2023-09-24 LAB — TSH: TSH: 1.21 u[IU]/mL (ref 0.450–4.500)

## 2023-09-25 ENCOUNTER — Ambulatory Visit: Payer: Self-pay | Admitting: Family Medicine

## 2023-09-25 LAB — MAGNESIUM: Magnesium: 2.1 mg/dL (ref 1.6–2.3)

## 2023-09-25 LAB — SEDIMENTATION RATE: Sed Rate: 31 mm/h (ref 0–40)

## 2023-09-25 LAB — C-REACTIVE PROTEIN: CRP: 7 mg/L (ref 0–10)

## 2023-09-25 LAB — VITAMIN B12: Vitamin B-12: 771 pg/mL (ref 232–1245)

## 2023-09-26 ENCOUNTER — Encounter: Payer: Self-pay | Admitting: Family Medicine

## 2023-09-26 ENCOUNTER — Other Ambulatory Visit: Payer: Self-pay | Admitting: Family Medicine

## 2023-09-26 ENCOUNTER — Telehealth: Payer: Self-pay

## 2023-09-26 MED ORDER — ESOMEPRAZOLE MAGNESIUM 40 MG PO CPDR: 40.0000 mg | DELAYED_RELEASE_CAPSULE | Freq: Every day | ORAL | 1 refills | Status: DC

## 2023-09-26 NOTE — Telephone Encounter (Signed)
 Copied from CRM [id number]. Topic: General - Other >> Sep 25, 2023  9:02 AM Jasmin G wrote: Reason for CRM: Staff from [facility] called regarding the faxing of a recent application fro Ozempic  med for pt. >> Sep 26, 2023 10:59 AM Corin V wrote: Reviewed call for additional information: Harlene with [employer] was calling to advise they received the refill from for the patient Ozempic , but the patient's application has expired. She was calling to make the office aware that she had faxed over a 10 page application that the provider/patient will have to complete to and resubmit so an active application can be placed back on file for the refill to be completed. The clinic does not need to call back unless there are questions. Harlene called from [phone]

## 2023-09-29 ENCOUNTER — Ambulatory Visit: Attending: Internal Medicine | Admitting: Internal Medicine

## 2023-09-29 ENCOUNTER — Encounter: Payer: Self-pay | Admitting: Internal Medicine

## 2023-09-29 ENCOUNTER — Encounter (INDEPENDENT_AMBULATORY_CARE_PROVIDER_SITE_OTHER): Payer: Self-pay

## 2023-09-29 VITALS — BP 116/64 | HR 64 | Temp 97.6°F | Resp 16 | Ht 64.0 in | Wt 346.8 lb

## 2023-09-29 DIAGNOSIS — Z79899 Other long term (current) drug therapy: Secondary | ICD-10-CM | POA: Diagnosis not present

## 2023-09-29 DIAGNOSIS — M25551 Pain in right hip: Secondary | ICD-10-CM

## 2023-09-29 DIAGNOSIS — L409 Psoriasis, unspecified: Secondary | ICD-10-CM

## 2023-09-29 DIAGNOSIS — L4050 Arthropathic psoriasis, unspecified: Secondary | ICD-10-CM | POA: Insufficient documentation

## 2023-09-29 DIAGNOSIS — M25552 Pain in left hip: Secondary | ICD-10-CM | POA: Diagnosis not present

## 2023-09-29 DIAGNOSIS — Z0000 Encounter for general adult medical examination without abnormal findings: Secondary | ICD-10-CM | POA: Insufficient documentation

## 2023-09-29 NOTE — Assessment & Plan Note (Signed)
-   Needs labs for annual physical. Multiple labs already ordered by pain management for an appointment tomorrow. - Mammogram scheduled for this month. - GYN appointment scheduled for next month. - Labs for TSH will be drawn today. - Will receive influenza vaccine today. - Counseled to identify a pharmacy for the COVID-19 vaccine. A prescription for the specific brand carried by the chosen pharmacy will be provided.

## 2023-09-29 NOTE — Assessment & Plan Note (Signed)
-   Diagnosis recently changed from seronegative rheumatoid arthritis by rheumatologist. 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 - Continue Cosentyx  as prescribed by rheumatology.

## 2023-09-29 NOTE — Assessment & Plan Note (Signed)
-   Grade 4 changes on x-ray, confirmed by orthopedics. Requires total hip arthroplasty. A recent intra-articular cortisone injection provided significant pain relief. Pain is severe and limits mobility. - Continue meloxicam  for pain. - Awaiting surgical clearance, which is dependent on weight loss.

## 2023-09-29 NOTE — Assessment & Plan Note (Signed)
-   Difficulty with weight loss despite diet and medication. Limited by pain with exercise. Considering bariatric surgery to meet weight requirements for orthopedic surgeries. Currently on Ozempic  1 mg. - Increase Ozempic  to 2 mg daily. - If no significant weight loss on max dose of Ozempic , will consider switching to tirzepatide (Mounjaro/Zepbound). - Continue with bariatric surgery evaluation process.

## 2023-09-30 ENCOUNTER — Other Ambulatory Visit: Payer: Self-pay | Admitting: Family Medicine

## 2023-09-30 DIAGNOSIS — K5904 Chronic idiopathic constipation: Secondary | ICD-10-CM

## 2023-09-30 DIAGNOSIS — K7581 Nonalcoholic steatohepatitis (NASH): Secondary | ICD-10-CM

## 2023-09-30 DIAGNOSIS — K219 Gastro-esophageal reflux disease without esophagitis: Secondary | ICD-10-CM

## 2023-10-01 LAB — CBC WITH DIFFERENTIAL/PLATELET
Absolute Lymphocytes: 1809 {cells}/uL (ref 850–3900)
Absolute Monocytes: 596 {cells}/uL (ref 200–950)
Basophils Absolute: 34 {cells}/uL (ref 0–200)
Basophils Relative: 0.5 %
Eosinophils Absolute: 60 {cells}/uL (ref 15–500)
Eosinophils Relative: 0.9 %
HCT: 40.9 % (ref 35.0–45.0)
Hemoglobin: 13.3 g/dL (ref 11.7–15.5)
MCH: 30.4 pg (ref 27.0–33.0)
MCHC: 32.5 g/dL (ref 32.0–36.0)
MCV: 93.6 fL (ref 80.0–100.0)
MPV: 11.5 fL (ref 7.5–12.5)
Monocytes Relative: 8.9 %
Neutro Abs: 4201 {cells}/uL (ref 1500–7800)
Neutrophils Relative %: 62.7 %
Platelets: 235 Thousand/uL (ref 140–400)
RBC: 4.37 Million/uL (ref 3.80–5.10)
RDW: 12.3 % (ref 11.0–15.0)
Total Lymphocyte: 27 %
WBC: 6.7 Thousand/uL (ref 3.8–10.8)

## 2023-10-01 LAB — COMPREHENSIVE METABOLIC PANEL WITH GFR
AG Ratio: 1.9 (calc) (ref 1.0–2.5)
ALT: 15 U/L (ref 6–29)
AST: 15 U/L (ref 10–35)
Albumin: 4.5 g/dL (ref 3.6–5.1)
Alkaline phosphatase (APISO): 41 U/L (ref 37–153)
BUN: 16 mg/dL (ref 7–25)
CO2: 30 mmol/L (ref 20–32)
Calcium: 9.9 mg/dL (ref 8.6–10.4)
Chloride: 104 mmol/L (ref 98–110)
Creat: 0.65 mg/dL (ref 0.50–1.03)
Globulin: 2.4 g/dL (ref 1.9–3.7)
Glucose, Bld: 97 mg/dL (ref 65–99)
Potassium: 4 mmol/L (ref 3.5–5.3)
Sodium: 142 mmol/L (ref 135–146)
Total Bilirubin: 0.5 mg/dL (ref 0.2–1.2)
Total Protein: 6.9 g/dL (ref 6.1–8.1)
eGFR: 105 mL/min/1.73m2 (ref >=60)

## 2023-10-01 LAB — QUANTIFERON-TB GOLD PLUS
Mitogen-NIL: 7.68 [IU]/mL
NIL: 0.01 [IU]/mL
QuantiFERON-TB Gold Plus: NEGATIVE
TB1-NIL: 0 [IU]/mL
TB2-NIL: 0 [IU]/mL

## 2023-10-06 ENCOUNTER — Ambulatory Visit
Admission: RE | Admit: 2023-10-06 | Discharge: 2023-10-06 | Disposition: A | Discharge status: Home / Self Care | Source: Ambulatory Visit | Attending: Pain Medicine | Admitting: Pain Medicine

## 2023-10-06 ENCOUNTER — Other Ambulatory Visit: Payer: Self-pay | Admitting: Pain Medicine

## 2023-10-06 DIAGNOSIS — G894 Chronic pain syndrome: Secondary | ICD-10-CM

## 2023-10-06 DIAGNOSIS — M1712 Unilateral primary osteoarthritis, left knee: Secondary | ICD-10-CM | POA: Diagnosis not present

## 2023-10-06 DIAGNOSIS — M170 Bilateral primary osteoarthritis of knee: Secondary | ICD-10-CM

## 2023-10-06 DIAGNOSIS — Z79899 Other long term (current) drug therapy: Secondary | ICD-10-CM

## 2023-10-06 DIAGNOSIS — M47812 Spondylosis without myelopathy or radiculopathy, cervical region: Secondary | ICD-10-CM | POA: Diagnosis not present

## 2023-10-06 DIAGNOSIS — M899 Disorder of bone, unspecified: Secondary | ICD-10-CM

## 2023-10-06 DIAGNOSIS — G4486 Cervicogenic headache: Secondary | ICD-10-CM

## 2023-10-06 DIAGNOSIS — M47816 Spondylosis without myelopathy or radiculopathy, lumbar region: Secondary | ICD-10-CM | POA: Diagnosis not present

## 2023-10-06 DIAGNOSIS — M542 Cervicalgia: Secondary | ICD-10-CM

## 2023-10-06 DIAGNOSIS — M150 Primary generalized (osteo)arthritis: Secondary | ICD-10-CM

## 2023-10-06 DIAGNOSIS — M1711 Unilateral primary osteoarthritis, right knee: Secondary | ICD-10-CM | POA: Diagnosis not present

## 2023-10-06 DIAGNOSIS — M160 Bilateral primary osteoarthritis of hip: Secondary | ICD-10-CM | POA: Diagnosis not present

## 2023-10-06 DIAGNOSIS — G8929 Other chronic pain: Secondary | ICD-10-CM

## 2023-10-06 DIAGNOSIS — Z789 Other specified health status: Secondary | ICD-10-CM

## 2023-10-09 ENCOUNTER — Ambulatory Visit

## 2023-10-10 NOTE — Progress Notes (Signed)
 Elizabeth Patrick                                          MRN: [id number]   10/10/2023   The VBCI Quality Team Specialist reviewed this patient medical record for the purposes of chart review for care gap closure. The following were reviewed: abstraction for care gap closure-kidney health evaluation for diabetes:eGFR  and uACR.    VBCI Quality Team

## 2023-10-13 IMAGING — MG MM DIGITAL SCREENING BILAT W/ TOMO AND CAD
8 series · 8 of 24 positions shown · non-contrast
Comparison: Previous exam(s).

CLINICAL DATA: Screening.

EXAM:
DIGITAL SCREENING BILATERAL MAMMOGRAM WITH TOMOSYNTHESIS AND CAD
TECHNIQUE: Bilateral screening digital craniocaudal and mediolateral oblique
mammograms were obtained. Bilateral screening digital breast
tomosynthesis was performed. The images were evaluated with
computer-aided detection.

[R MLO synth-2D]
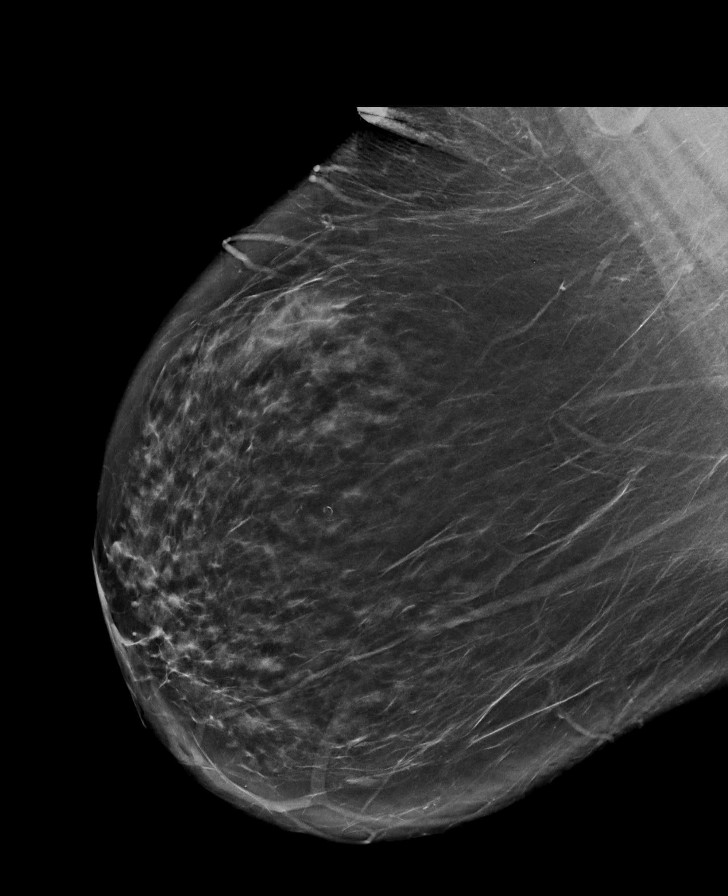

[R CC synth-2D]
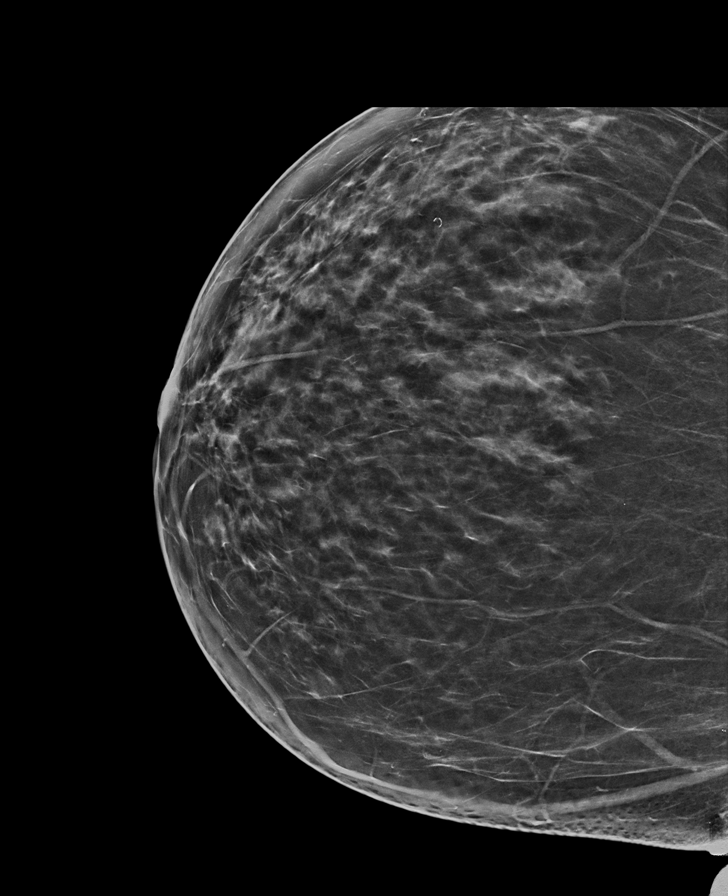

[L MLO synth-2D]
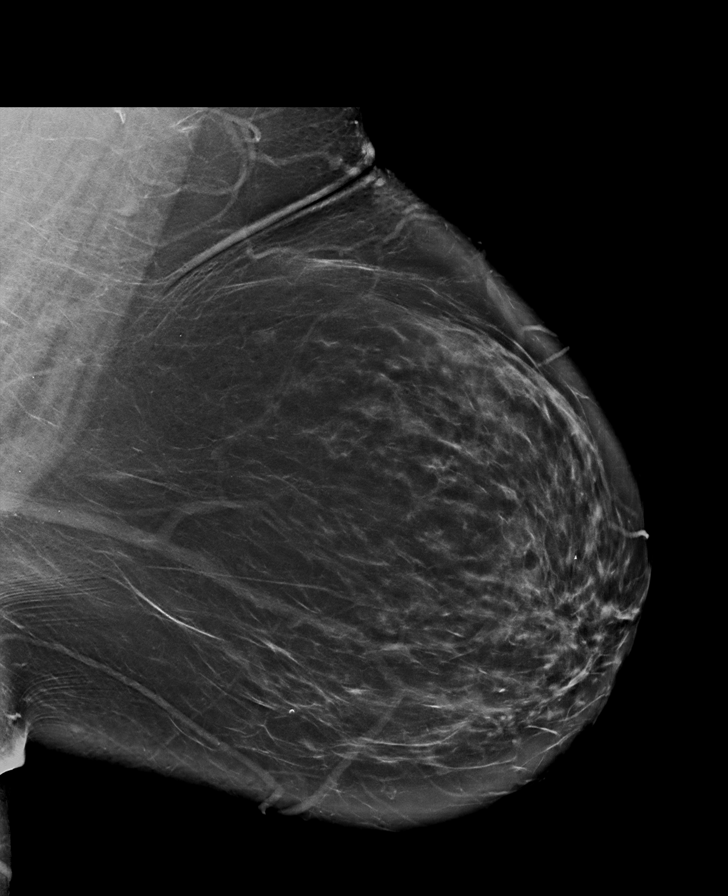

[L CC synth-2D]
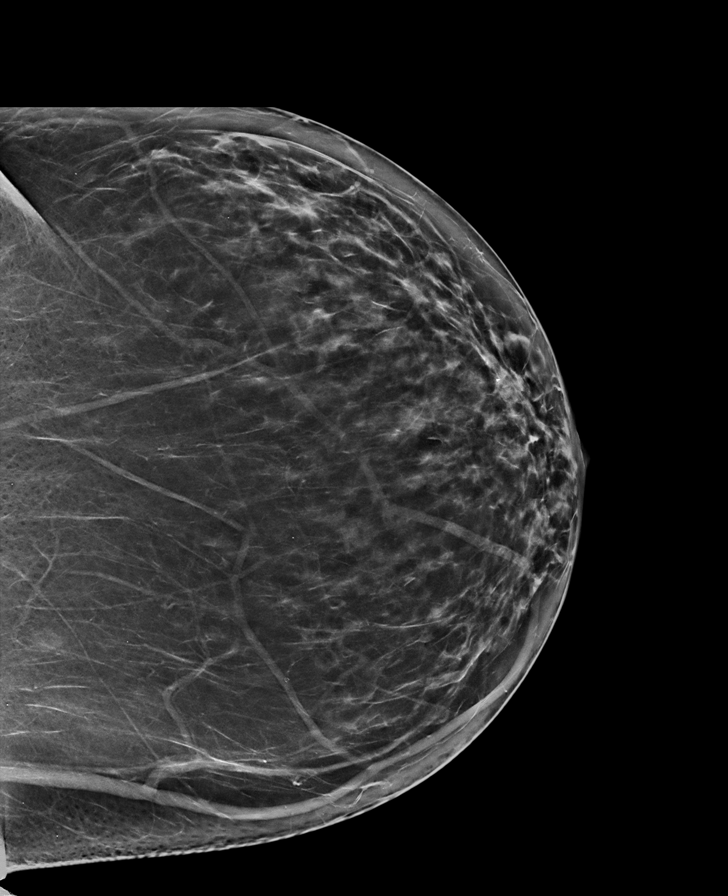

[R CC tomo · tomo slice 37/74.0]
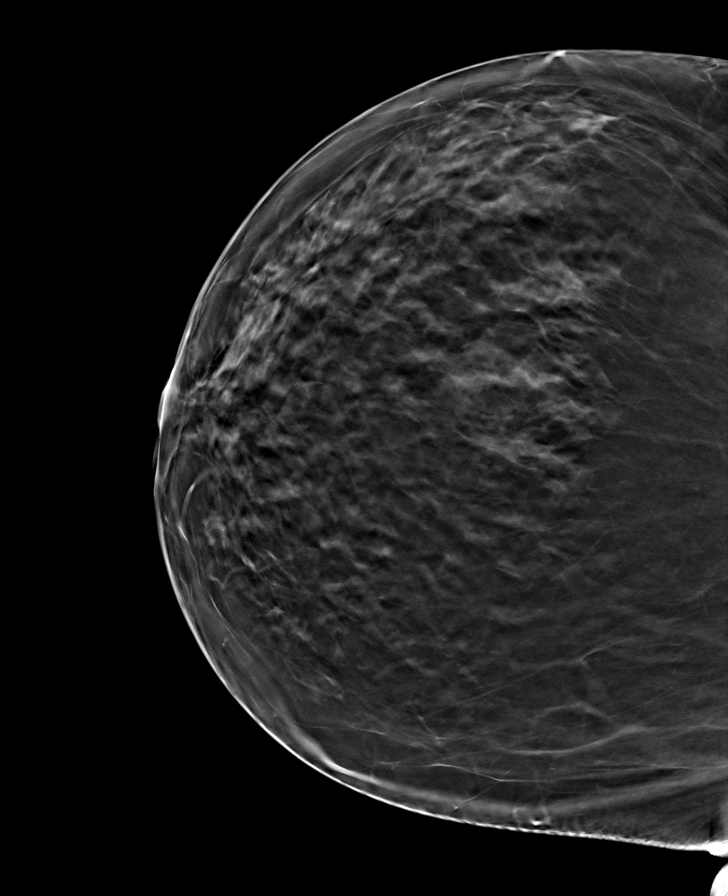

[R MLO tomo · tomo slice 49/98.0]
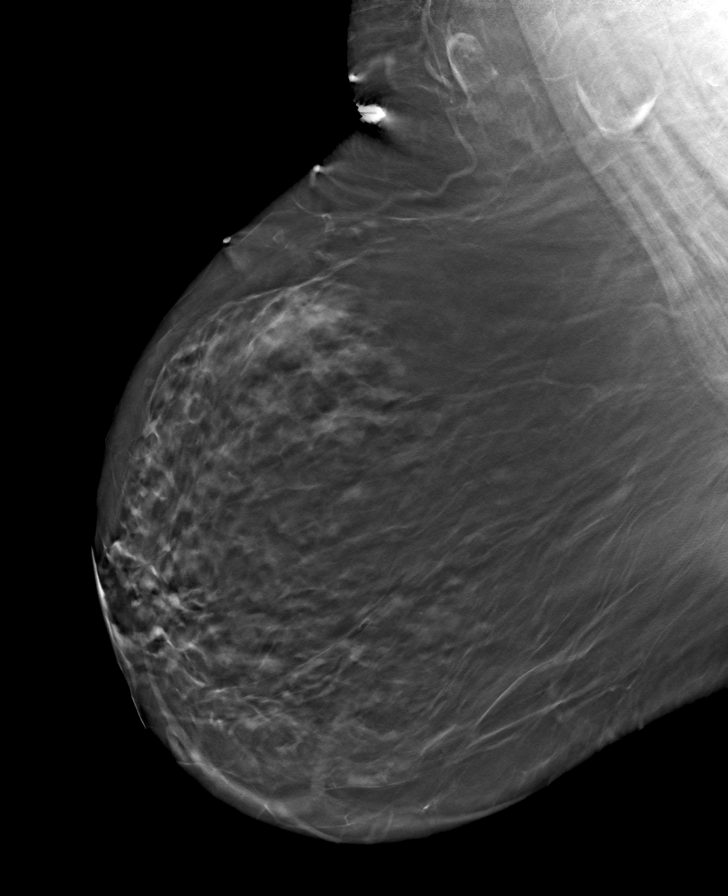

[L CC tomo · tomo slice 39/78.0]
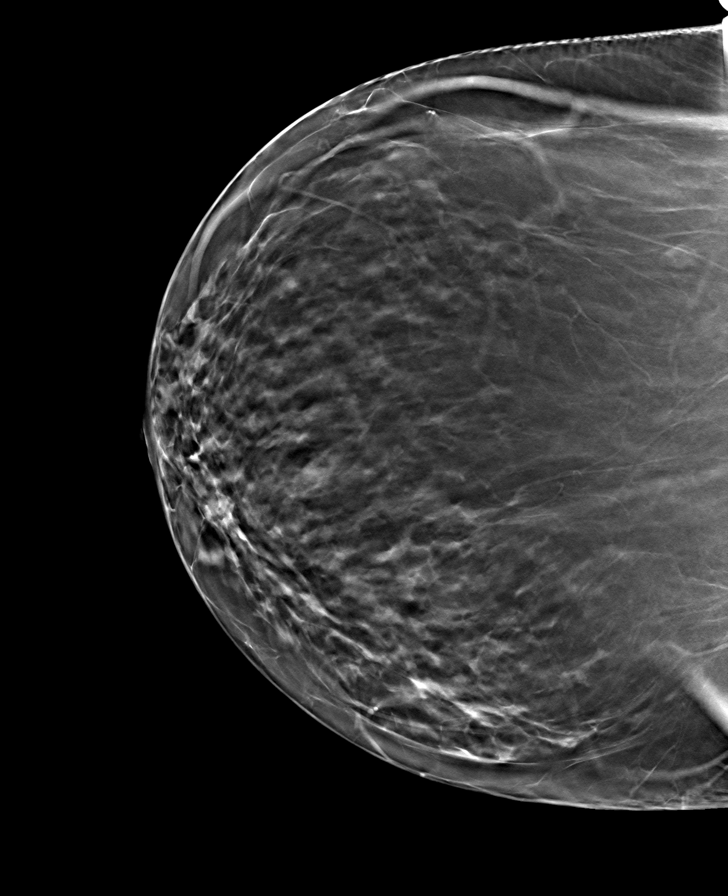

[L MLO tomo · tomo slice 51/102.0]
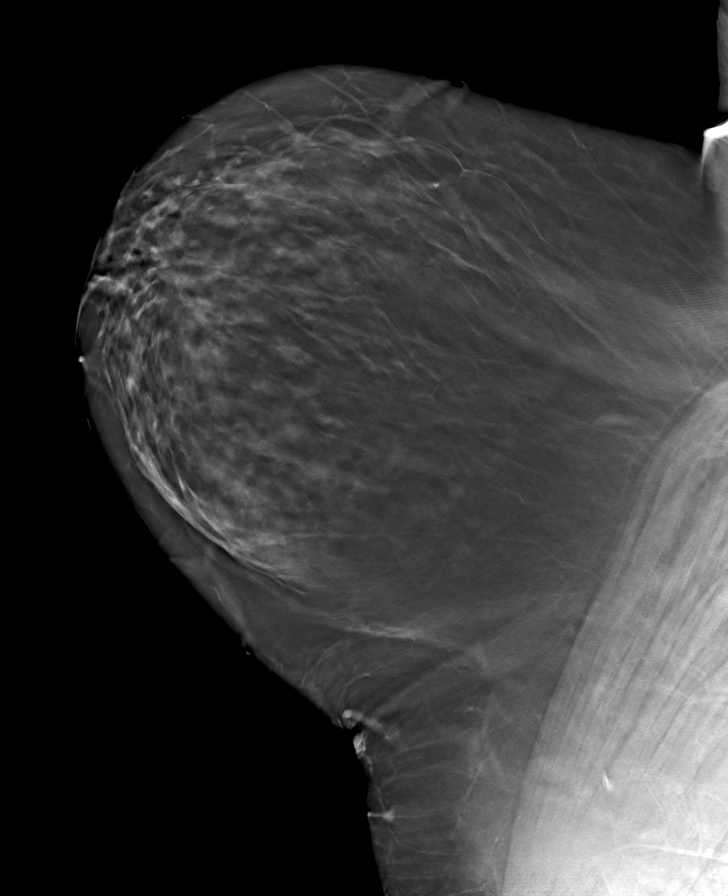

[8 of 24 positions shown; findings below may reference images not displayed]

ACR Breast Density Category b: There are scattered areas of
fibroglandular density.
FINDINGS: There are no findings suspicious for malignancy.
IMPRESSION: No mammographic evidence of malignancy. A result letter of this
screening mammogram will be mailed directly to the patient.

RECOMMENDATION:
Screening mammogram in one year. (Code:51-O-LD2)

BI-RADS CATEGORY  1: Negative.

## 2023-10-15 DIAGNOSIS — E109 Type 1 diabetes mellitus without complications: Secondary | ICD-10-CM | POA: Diagnosis not present

## 2023-10-17 ENCOUNTER — Encounter: Payer: Self-pay | Admitting: Family Medicine

## 2023-10-18 ENCOUNTER — Other Ambulatory Visit: Payer: Self-pay | Admitting: Obstetrics and Gynecology

## 2023-10-18 DIAGNOSIS — Z5181 Encounter for therapeutic drug level monitoring: Secondary | ICD-10-CM

## 2023-10-20 ENCOUNTER — Other Ambulatory Visit: Payer: Self-pay

## 2023-10-20 ENCOUNTER — Ambulatory Visit: Admitting: Pain Medicine

## 2023-10-20 ENCOUNTER — Ambulatory Visit: Admitting: Sports Medicine

## 2023-10-20 ENCOUNTER — Encounter: Payer: Self-pay | Admitting: Internal Medicine

## 2023-10-20 DIAGNOSIS — M160 Bilateral primary osteoarthritis of hip: Secondary | ICD-10-CM

## 2023-10-20 DIAGNOSIS — G8929 Other chronic pain: Secondary | ICD-10-CM | POA: Diagnosis not present

## 2023-10-20 DIAGNOSIS — L409 Psoriasis, unspecified: Secondary | ICD-10-CM

## 2023-10-20 DIAGNOSIS — M1611 Unilateral primary osteoarthritis, right hip: Secondary | ICD-10-CM

## 2023-10-20 DIAGNOSIS — M25551 Pain in right hip: Secondary | ICD-10-CM

## 2023-10-20 MED ORDER — LIDOCAINE HCL 1 % IJ SOLN
4.0000 mL | INTRAMUSCULAR | Status: AC | PRN
  Administered 2023-10-20: 4 mL

## 2023-10-20 MED ORDER — METHYLPREDNISOLONE ACETATE 40 MG/ML IJ SUSP
80.0000 mg | INTRAMUSCULAR | Status: AC | PRN
  Administered 2023-10-20: 80 mg via INTRA_ARTICULAR

## 2023-10-20 NOTE — Telephone Encounter (Signed)
.  Med refill request: Vivelle  Dot patch  Last AEX: 08/15/22 Next AEX: 02/23/24 Last MMG (if hormonal med) scheduled tomorrow 10/21/23  Refill authorized: Please Advise?

## 2023-10-20 NOTE — Progress Notes (Signed)
 Patient says that she got 2 months of great relief from the injection in July. She has psoriatic arthritis and believes she is in a flare right now, so everything hurts, including the right hip. She is here today for repeat injection.

## 2023-10-20 NOTE — Progress Notes (Signed)
 Elizabeth Patrick - 53 y.o. female MRN [id number]  Date of birth: April 12, 1970  Office Visit Note: Visit Date: 10/20/2023 PCP: Chandra Toribio POUR, MD Referred by: Chandra Toribio POUR, MD  Subjective: Chief Complaint  Patient presents with   Right Hip - Pain   HPI: Elizabeth Patrick is a pleasant 53 y.o. female who presents today for acute on chronic right > left hip pain with OA.  Elizabeth Patrick has had a reexacerbation of her right > left hip OA.  She does have severe osteoarthritis.  Back over 3 months ago we did perform ultrasound-guided hip injection which gave her excellent relief of her pain for about 2 months and then her pain slowly started returning.  Both hips are bothersome although right is worse than left.  Overall she feels she is in the psoriatic flare.  She did message Dr. Jeannetta to see if you could consider a Medrol  Dosepak/prednisone .  She is on her third month of Cosentyx  - not noticing a big difference however.  She is working on losing weight to reduce her weight goal, with overall plan for hip replacement if can get down to weight goal. She currently is on Ozempic  1--> 2mg  IM weekly, took brief holiday 2/2 gastroparesis.  She is managed on Meloxicam  15mg  every day, Lyrica 225 mg 3 times a day, does follow with pain management.  She is considering gastric bypass surgery, but her husband, Elizabeth Patrick, has to have a cardiac surgery first so they are focusing on that at the moment.  Pertinent ROS were reviewed with the patient and found to be negative unless otherwise specified above in HPI.   Assessment & Plan: Visit Diagnoses:  1. Bilateral primary osteoarthritis of hip   2. Chronic right hip pain    Plan: Impression is bilateral severe hip osteoarthritis with right hip exacerbation > left hip.  Over 3 months ago she did receive very good relief for at least 2 months from prior ultrasound-guided right hip injection, she would like to repeat this again today.  We did perform this under ultrasound  guidance, tolerated well.  Advised on postinjection protocol.  She may use ice/heat or Tylenol  for postinjection pain.  For her joint related pain and chronic pain, she may continue meloxicam  15 mg once daily as well as her Lyrica to 25 mg 3 times daily that she receives from pain management.  She does seem to be in a flare of her psoriatic arthritis as well, she has messaged Dr. Jeannetta for next steps with this.  She will continue working on both dietary changes and physical exercise as able for her weight loss, continue Ozempic  2 mg IM weekly medication for this. I would like to see her back in about 3 weeks to evaluate how she is doing for the right hip and consider L-hip injection if she feels she needs this at that time.   Follow-up: Return in about 3 weeks  Meds & Orders: No orders of the defined types were placed in this encounter.   Orders Placed This Encounter  Procedures   Large Joint Inj   US  Guided Needle Placement - No Linked Charges     Procedures: Large Joint Inj: R hip joint on 10/20/2023 1:26 PM Indications: pain Details: 22 G 3.5 in needle, ultrasound-guided anterior approach Medications: 4 mL lidocaine  1 %; 80 mg methylPREDNISolone  acetate 40 MG/ML Outcome: tolerated well, no immediate complications  Procedure: US -guided intra-articular hip injection, Right After discussion on risks/benefits/indications and informed verbal consent  was obtained, a timeout was performed. Patient was lying supine on exam table. The hip was cleaned with betadine and alcohol swabs. Then utilizing ultrasound guidance, the patient's femoral head and neck junction was identified and subsequently injected with 4:2 lidocaine :depomedrol via an in-plane approach with ultrasound visualization of the injectate administered into the hip joint. Patient tolerated procedure well without immediate complications.  Procedure, treatment alternatives, risks and benefits explained, specific risks discussed. Consent was  given by the patient. Immediately prior to procedure a time out was called to verify the correct patient, procedure, equipment, support staff and site/side marked as required. Patient was prepped and draped in the usual sterile fashion.          Clinical History: No specialty comments available.  She reports that she quit smoking about 18 years ago. Her smoking use included cigarettes. She started smoking about 28 years ago. She has a 10 pack-year smoking history. She has been exposed to tobacco smoke. She has never used smokeless tobacco.  Recent Labs    12/16/22 1442 09/23/23 0954  HGBA1C 5.2 5.1    Objective:    Physical Exam  Gen: Well-appearing, in no acute distress; non-toxic CV: Well-perfused. Warm.  Resp: Breathing unlabored on room air; no wheezing. Psych: Fluid speech in conversation; appropriate affect; normal thought process  Ortho Exam - Bilateral hips: No redness swelling or effusion.  Patient does ambulate with assistance of a walker.  There is pain with flexion of the hip and transferring onto the examination table.  Limited internal/external rotation.  Imaging:  *I did personally reviewed bilateral hip x-rays today during the visit.  Narrative & Impression  CLINICAL DATA:  Chronic bilateral hip pain   EXAM: DG HIP (WITH OR WITHOUT PELVIS) 3-4V BILAT   COMPARISON:  Renal stone CT 12/15/2020; pelvic radiograph 06/25/2023   FINDINGS: Redemonstrated severe bilateral hip joint degenerative changes, right greater than left. No acute osseous abnormality. SI joints unremarkable. Lumbar spine degenerative changes.   IMPRESSION: Severe bilateral hip joint degenerative changes, right greater than left.     Electronically Signed   By: Bard Moats M.D.   On: 10/06/2023 20:04    Past Medical/Family/Surgical/Social History: Medications & Allergies reviewed per EMR, new medications updated. Patient Active Problem List   Diagnosis Date Noted   Psoriatic  arthritis (HCC) 09/29/2023   Healthcare maintenance 09/29/2023   Chronic low back pain (Bilateral) w/o sciatica 09/24/2023   Chronic knee pain (Bilateral) 09/24/2023   Cervicalgia 09/24/2023   Cervicogenic headache (Right) 09/24/2023   Primary osteoarthritis involving multiple joints 09/24/2023   Chronic pain syndrome 09/23/2023   Pharmacologic therapy 09/23/2023   Disorder of skeletal system 09/23/2023   Problems influencing health status 09/23/2023   Cervical radiculopathy 09/23/2023   Long term (current) use of opiate analgesic 09/23/2023   Chronic neck pain 09/23/2023   Primary osteoarthritis of knees (Bilateral) 09/23/2023   Wrist pain, right 09/23/2023   Lumbosacral spondylosis without myelopathy 09/23/2023   Polyneuropathy due to type 2 diabetes mellitus (HCC) 09/23/2023   Encounter for long-term opiate analgesic use 09/23/2023   Abnormal MRI, cervical spine (06/18/2022) 09/23/2023   Dermatitis due to sun 06/23/2023   High risk medication use 04/08/2023   Vitamin D  deficiency 04/08/2023   Screening for tuberculosis 04/08/2023   Intertrigo 12/16/2022   Chronic idiopathic constipation 09/06/2022   Gastroparesis 06/26/2022   Chronic hip pain (Right) 12/15/2021   DDD (degenerative disc disease), lumbosacral 12/15/2021   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/2021   Vitamin B12 deficiency  07/23/2021   Severe sleep apnea 07/04/2021   Psoriasis and similar disorder 05/27/2021   Depression, major, single episode, moderate (HCC) 04/29/2021   Loud snoring 04/17/2021   Menopause 04/16/2021   Morbid obesity (HCC) 04/05/2021   Type 2 diabetes mellitus without complication, with long-term current use of insulin  (HCC) 11/28/2020   Gastroesophageal reflux disease 11/28/2020   Recurrent UTI 10/17/2020   Hyperlipidemia associated with type 2 diabetes mellitus (HCC) 10/11/2020   Bilateral lower extremity edema 10/11/2020   Diabetic polyneuropathy associated with type 2 diabetes mellitus (HCC)  10/11/2020   NASH (nonalcoholic steatohepatitis) 10/10/2020   Hypertension associated with diabetes (HCC) 09/26/2020   Osteoarthritis 09/26/2020   Past Medical History:  Diagnosis Date   Abnormal uterine bleeding    Anemia    Blood transfusion without reported diagnosis    Complication of anesthesia    can wake up during procedure   Diabetes mellitus without complication (HCC)    Dysmenorrhea    Endometriosis    Fibroid    Genital warts    GERD (gastroesophageal reflux disease)    Hypertension    PONV (postoperative nausea and vomiting)    RA (rheumatoid arthritis) (HCC)    Radiculopathy    Family History  Problem Relation Age of Onset   Obesity Mother    Coronary artery disease Mother    Bipolar disorder Mother    Multiple sclerosis Mother    Hypertension Father    Heart attack Father    Vascular Disease Father    Breast cancer Maternal Grandmother 54   Past Surgical History:  Procedure Laterality Date   ABDOMINAL HYSTERECTOMY     CARPOMETACARPEL SUSPENSION PLASTY Left 02/13/2023   Procedure: Left trapeziectomy with ligament reconstruction and tendon interposition;  Surgeon: Romona Harari, MD;  Location: [facility] OR;  Service: Orthopedics;  Laterality: Left;  regional, he can start earlier if need be to fit   CLOSED REDUCTION FINGER WITH PERCUTANEOUS PINNING Left 02/13/2023   Procedure: possible percutaneous pinning of thumb MCP join;  Surgeon: Romona Harari, MD;  Location: [facility] OR;  Service: Orthopedics;  Laterality: Left;  regional he can start earlier if need be to fit   COLONOSCOPY WITH PROPOFOL  N/A 06/26/2022   Procedure: COLONOSCOPY WITH PROPOFOL ;  Surgeon: Therisa Bi, MD;  Location[facility] ENDOSCOPY;  Service: Gastroenterology;  Laterality: N/A;  Patient informed the office that she wakes from anesthesia combative and pulliing at tubes.  She wanted us  to be aware.   ESOPHAGOGASTRODUODENOSCOPY (EGD) WITH PROPOFOL  N/A 06/26/2022   Procedure: ESOPHAGOGASTRODUODENOSCOPY  (EGD) WITH PROPOFOL ;  Surgeon: Therisa Bi, MD;  Location: [facility] ENDOSCOPY;  Service: Gastroenterology;  Laterality: N/A;   PARATHYROIDECTOMY     Social History   Occupational History   Not on file  Tobacco Use   Smoking status: Former    Current packs/day: 0.00    Average packs/day: 1 pack/day for 10.0 years (10.0 ttl pk-yrs)    Types: Cigarettes    Start date: 01/15/1995    Quit date: 01/14/2005    Years since quitting: 18.7    Passive exposure: Past   Smokeless tobacco: Never  Vaping Use   Vaping status: Never Used  Substance and Sexual Activity   Alcohol use: Not Currently   Drug use: Yes    Types: Hydrocodone   Sexual activity: Yes    Partners: Male    Birth control/protection: Surgical    Comment: Hysterectomy; First IC @ 13y/o, Partners >5, DES exposure-unknown

## 2023-10-21 ENCOUNTER — Telehealth: Payer: Self-pay | Admitting: *Deleted

## 2023-10-21 ENCOUNTER — Ambulatory Visit: Admitting: Obstetrics and Gynecology

## 2023-10-21 ENCOUNTER — Ambulatory Visit
Admission: RE | Admit: 2023-10-21 | Discharge: 2023-10-21 | Disposition: A | Discharge status: Home / Self Care | Source: Ambulatory Visit | Attending: Obstetrics and Gynecology | Admitting: Obstetrics and Gynecology

## 2023-10-21 DIAGNOSIS — Z1231 Encounter for screening mammogram for malignant neoplasm of breast: Secondary | ICD-10-CM | POA: Diagnosis not present

## 2023-10-21 NOTE — Telephone Encounter (Signed)
 Contacted pt to inform her that she would need to re-sign her application.  Apparently they stated that her signature looked like initials and she needed to redo them.  Told her that they said she could complete them digitally if she wanted.  Also I was informed that after Jan 1 she would no longer be eligible for this program and she stated since that was the case she would not complete this form again and that she would finish what she has on hand and would see what step the provider would like to take as far as medication. Please advise.  I did inform her that PCP is out of office this week.

## 2023-10-22 MED ORDER — PREDNISONE 5 MG PO TABS: ORAL_TABLET | ORAL | 0 refills | Status: AC

## 2023-10-23 ENCOUNTER — Other Ambulatory Visit: Payer: Self-pay | Admitting: Obstetrics and Gynecology

## 2023-10-23 ENCOUNTER — Encounter: Payer: Self-pay | Admitting: Obstetrics and Gynecology

## 2023-10-23 DIAGNOSIS — N950 Postmenopausal bleeding: Secondary | ICD-10-CM

## 2023-10-23 NOTE — Telephone Encounter (Signed)
 Med refill request: progesterone   Last AEX: 08/15/22 Next AEX:02/23/24 Last MMG (if hormonal med) 10/21/23 results not back yet Refill authorized: last rx 10/10/22 #90 with 3 refills. Please approve or deny

## 2023-10-23 NOTE — Telephone Encounter (Signed)
 Med refill request:progesterone  100 mg PO daily Last AEX: 08/15/22 -BS Next AEX: 02/23/24 -BS Last MMG (if hormonal med) 10/21/23 -pending Refill authorized: Please Advise?

## 2023-10-24 MED ORDER — PROGESTERONE MICRONIZED 100 MG PO CAPS
100.0000 mg | ORAL_CAPSULE | Freq: Every day | ORAL | 1 refills | Status: AC
Start: 2023-10-24 — End: ?

## 2023-10-26 ENCOUNTER — Ambulatory Visit: Payer: Self-pay | Admitting: Obstetrics and Gynecology

## 2023-10-27 NOTE — Telephone Encounter (Signed)
 Contacted pt and informed her that the provider would like for her to resign and to see what she can get before the end of the year. She stated she would resubmit her signatures.

## 2023-10-27 NOTE — Telephone Encounter (Signed)
 Does it say why she would no longer be eligible for the program?    She should still re-send the form so she can have some extra while figure out what to do next with her medication.

## 2023-11-02 DIAGNOSIS — Z872 Personal history of diseases of the skin and subcutaneous tissue: Secondary | ICD-10-CM | POA: Insufficient documentation

## 2023-11-02 DIAGNOSIS — L4059 Other psoriatic arthropathy: Secondary | ICD-10-CM | POA: Insufficient documentation

## 2023-11-02 NOTE — Progress Notes (Unsigned)
 PROVIDER NOTE: Interpretation of information contained herein should be left to medically-trained personnel. Specific patient instructions are provided elsewhere under Patient Instructions section of medical record. This document was created in part using AI and STT-dictation technology, any transcriptional errors that may result from this process are unintentional.  Patient: Elizabeth Patrick  Service: E/M   PCP: Chandra Toribio POUR, MD  DOB: Jul 15, 1970  DOS: 11/03/2023  Provider: Eric DELENA Como, MD  MRN: [id number]  Delivery: Face-to-face  Specialty: Interventional Pain Management  Type: Established Patient  Setting: Ambulatory outpatient facility  Specialty designation: 09  Referring Prov.: Chandra Toribio POUR, MD  Location: Outpatient office facility       Primary Reason(s) for Visit: Encounter for evaluation before starting new chronic pain management plan of care (Level of risk: moderate) CC: No chief complaint on file.  HPI  Elizabeth Patrick is a 53 y.o. year old, female patient, who comes today for a follow-up evaluation to review the test results and decide on a treatment plan. She has Hypertension associated with diabetes (HCC); Osteoarthritis; NASH (nonalcoholic steatohepatitis); Hyperlipidemia associated with type 2 diabetes mellitus (HCC); Bilateral lower extremity edema; Recurrent UTI; Type 2 diabetes mellitus without complication, with long-term current use of insulin  (HCC); Gastroesophageal reflux disease; Morbid obesity (HCC); Menopause; Loud snoring; Depression, major, single episode, moderate (HCC); Psoriasis and similar disorder; Severe sleep apnea; Vitamin B12 deficiency; Diabetic polyneuropathy associated with type 2 diabetes mellitus (HCC); Chronic hip pain (Right); DDD (degenerative disc disease), lumbosacral; Recurrent cold sores; Gastroparesis; Chronic idiopathic constipation; Intertrigo; High risk medication use; Vitamin D  deficiency; Screening for tuberculosis; Dermatitis due to sun;  Chronic pain syndrome; Pharmacologic therapy; Disorder of skeletal system; Problems influencing health status; Cervical radiculopathy; Long term (current) use of opiate analgesic; Chronic neck pain; Primary osteoarthritis of knees (Bilateral); Wrist pain, right; Lumbosacral spondylosis without myelopathy; Polyneuropathy due to type 2 diabetes mellitus (HCC); Encounter for long-term opiate analgesic use; Abnormal MRI, cervical spine (06/18/2022); Chronic low back pain (Bilateral) w/o sciatica; Chronic knee pain (Bilateral); Cervicalgia; Cervicogenic headache (Right); Primary osteoarthritis involving multiple joints; Psoriatic arthritis (HCC); Healthcare maintenance; History of psoriatic arthritis; and Polyarticular psoriatic arthritis (HCC) on their problem list. Her primarily concern today is the No chief complaint on file.  Pain Assessment: Location:     Radiating:   Onset:   Duration:   Quality:   Severity:  /10 (subjective, self-reported pain score)  Effect on ADL:   Timing:   Modifying factors:   BP:    HR:    Elizabeth Patrick comes in today for a follow-up visit after her initial evaluation on 09/24/2023. Today we went over the results of her tests. These were explained in Layman's terms. During today's appointment we went over my diagnostic impression, as well as the proposed treatment plan.  Review of initial evaluation (09/24/2023): Elizabeth Patrick is a 53 year old female with psoriatic arthritis, osteoarthritis, and spinal issues who presents for chronic pain management. She was referred by Dr. Ashley from the pain clinic in [facility] for further pain management options.   She experiences widespread pain with daily variations in location and intensity. Bilateral knee pain is present, with the left knee being worse. She has undergone surgeries on both knees, with x-rays showing bone on bone arthritis. Steroid injections every three months provide some relief, but a nerve block was  ineffective.   Right hip pain extends into the groin, with x-rays indicating bone on bone arthritis. A cortisone injection two months ago provided significant  relief, but the effect is diminishing. No surgeries have been performed on the right hip.   Neck pain is bilateral with associated headaches starting in the back of the head and wrapping around the right side. Physical therapy worsened symptoms, and epidural steroid injections offered only temporary relief. An MRI was performed last year.   Back pain is managed with ablations every six months, providing complete relief. She uses a walker 98% of the time due to pain and mobility issues.   She is on Ozempic  for weight management, having lost 75 pounds over two years, and is pursuing bariatric surgery to aid further weight loss, as her BMI is too high for joint replacement surgery.  Review of diagnostic test ordered on 09/24/2023:  Diagnostic lab work: *** Diagnostic imaging: ***  Discussed the use of AI scribe software for clinical note transcription with the patient, who gave verbal consent to proceed.  History of Present Illness          Patient presented with interventional treatment options. Elizabeth Patrick was informed that I will not be providing medication management. Pharmacotherapy evaluation including recommendations may be offered, if specifically requested.   Controlled Substance Pharmacotherapy Assessment REMS (Risk Evaluation and Mitigation Strategy)  Opioid Analgesic: None MME/day: 0 mg/day   Pill Count: None expected due to no prior prescriptions written by our practice. No notes on file  Pharmacokinetics: Liberation and absorption (onset of action): WNL Distribution (time to peak effect): WNL Metabolism and excretion (duration of action): WNL         Pharmacodynamics: Desired effects: Analgesia: Elizabeth Patrick reports >50% benefit. Functional ability: Patient reports that medication allows her to accomplish basic  ADLs Clinically meaningful improvement in function (CMIF): Sustained CMIF goals met Perceived effectiveness: Described as relatively effective, allowing for increase in activities of daily living (ADL) Undesirable effects: Side-effects or Adverse reactions: None reported Monitoring: [place] PMP: PDMP reviewed during this encounter. Online review of the past 19-month period previously conducted. Not applicable at this point since we have not taken over the patient's medication management yet. List of other Serum/Urine Drug Screening Test(s):  No results found for: AMPHSCRSER, BARBSCRSER, BENZOSCRSER, COCAINSCRSER, COCAINSCRNUR, PCPSCRSER, THCSCRSER, THCU, CANNABQUANT, OPIATESCRSER, OXYSCRSER, PROPOXSCRSER, ETH, CBDTHCR, D8THCCBX, D9THCCBX List of all UDS test(s) done:  No results found for: TOXASSSELUR, SUMMARY Last UDS on record: No results found for: TOXASSSELUR, SUMMARY UDS interpretation: No unexpected findings.          Medication Assessment Form: Not applicable. No opioids. Treatment compliance: Not applicable Risk Assessment Profile: Aberrant behavior: See initial evaluations. None observed or detected today Comorbid factors increasing risk of overdose: See initial evaluation. No additional risks detected today Opioid risk tool (ORT):     09/24/2023    9:28 AM  Opioid Risk   Alcohol 0  Illegal Drugs 0  Rx Drugs 0  Alcohol 0  Illegal Drugs 0  Rx Drugs 0  Age between 16-45 years  0  History of Preadolescent Sexual Abuse 0  Psychological Disease 2  ADD Negative  OCD Negative  Bipolar Negative  Depression 1  Opioid Risk Tool Scoring 3  Opioid Risk Interpretation Low Risk    ORT Scoring interpretation table:  Score <3 = Low Risk for SUD  Score between 4-7 = Moderate Risk for SUD  Score >8 = High Risk for Opioid Abuse   Risk of substance use disorder (SUD): Low  Risk Mitigation Strategies:  Patient opioid safety counseling: No  controlled substances prescribed. Patient-Prescriber Agreement (PPA): No agreement  signed.  Controlled substance notification to other providers: None required. No opioid therapy.  Pharmacologic Plan: Non-opioid analgesic therapy offered. Interventional alternatives discussed.             Laboratory Chemistry Profile   Renal Lab Results  Component Value Date   BUN 16 09/29/2023   CREATININE 0.65 09/29/2023   BCR SEE NOTE: 09/29/2023   GFRNONAA >60 02/13/2023   SPECGRAV >=1.030 (A) 05/22/2021   PHUR 5.5 05/22/2021   PROTEINUR TRACE (A) 10/16/2021     Electrolytes Lab Results  Component Value Date   NA 142 09/29/2023   K 4.0 09/29/2023   CL 104 09/29/2023   CALCIUM  9.9 09/29/2023   MG 2.1 09/24/2023     Hepatic Lab Results  Component Value Date   AST 15 09/29/2023   ALT 15 09/29/2023   ALBUMIN 4.5 10/15/2022   ALKPHOS 37 (L) 10/15/2022     ID Lab Results  Component Value Date   HIV NON-REACTIVE 08/15/2022     Bone Lab Results  Component Value Date   VD25OH 61 04/08/2023     Endocrine Lab Results  Component Value Date   GLUCOSE 97 09/29/2023   GLUCOSEU NEGATIVE 10/16/2021   HGBA1C 5.1 09/23/2023   TSH 1.210 09/23/2023   FREET4 1.57 04/16/2021   ESTRADIOL  18 04/16/2021     Neuropathy Lab Results  Component Value Date   VITAMINB12 771 09/24/2023   FOLATE 16.0 07/23/2021   HGBA1C 5.1 09/23/2023   HIV NON-REACTIVE 08/15/2022     CNS No results found for: COLORCSF, APPEARCSF, RBCCOUNTCSF, WBCCSF, POLYSCSF, LYMPHSCSF, EOSCSF, PROTEINCSF, GLUCCSF, JCVIRUS, CSFOLI, IGGCSF, LABACHR, ACETBL   Inflammation (CRP: Acute  ESR: Chronic) Lab Results  Component Value Date   CRP 7 09/24/2023   ESRSEDRATE 31 09/24/2023     Rheumatology Lab Results  Component Value Date   RF <10 04/08/2023   ANA Negative 12/12/2020     Coagulation Lab Results  Component Value Date   PLT 235 09/29/2023     Cardiovascular Lab Results   Component Value Date   CKTOTAL 62 12/12/2020   HGB 13.3 09/29/2023   HCT 40.9 09/29/2023     Screening Lab Results  Component Value Date   HIV NON-REACTIVE 08/15/2022     Cancer No results found for: CEA, CA125, LABCA2   Allergens No results found for: ALMOND, APPLE, ASPARAGUS, AVOCADO, BANANA, BARLEY, BASIL, BAYLEAF, GREENBEAN, LIMABEAN, WHITEBEAN, BEEFIGE, REDBEET, BLUEBERRY, BROCCOLI, CABBAGE, MELON, CARROT, CASEIN, CASHEWNUT, CAULIFLOWER, CELERY     Note: Lab results reviewed.  Recent Diagnostic Imaging Review  Cervical Imaging: Cervical MR wo contrast: Results for orders placed during the hospital encounter of 06/09/22  MR CERVICAL SPINE WO CONTRAST  Narrative CLINICAL DATA:  Neck pain radiating to the bilateral hands for 7 months.  EXAM: MRI CERVICAL SPINE WITHOUT CONTRAST  TECHNIQUE: Multiplanar, multisequence MR imaging of the cervical spine was performed. No intravenous contrast was administered.  COMPARISON:  None Available.  FINDINGS: Alignment: Straightening of cervical lordosis  Vertebrae: No fracture, evidence of discitis, or bone lesion.  Cord: Normal signal and morphology.  Posterior Fossa, vertebral arteries, paraspinal tissues: Negative.  Disc levels:  C1-2: Tiny left posterolateral ganglion is likely in the ventral subarachnoid space.  C2-3: Degenerative facet spurring asymmetric to the left. Patent canal and foramina  C3-4: Moderate degenerative facet spurring.  C4-5: Bulky degenerative facet spurring on the right. Mild right foraminal narrowing  C5-6: Bulky degenerative facet spurring especially on the left. Mild left foraminal narrowing  C6-7:  Disc height loss with uncovertebral ridging and right foraminal protrusion. Advanced right and mild-to-moderate left foraminal narrowing  C7-T1:Mild facet spurring.  IMPRESSION: 1. Multilevel cervical spine degeneration especially  affecting facets and the C6-7 disc. 2. Focal right foraminal impingement at C6-7. Mild to moderate foraminal narrowing on the left at the same level. 3. Widely patent spinal canal.   Electronically Signed By: Dorn Roulette M.D. On: 06/18/2022 04:06  Cervical MR wo contrast: No results found for this or any previous visit.  Cervical CT wo contrast: No results found for this or any previous visit.  Cervical DG Bending/F/E views: Results for orders placed during the hospital encounter of 10/06/23  DG Cervical Spine With Flex & Extend  Narrative CLINICAL DATA:  Cervicalgia.  EXAM: CERVICAL SPINE COMPLETE WITH FLEXION AND EXTENSION VIEWS  COMPARISON:  MR cervical spine 06/09/2022  FINDINGS: Normal anatomic alignment. No evidence for acute fracture or dislocation. Multilevel degenerative disc disease most pronounced C6-7 multilevel facet degenerative changes. Right C3-4 and C6-7 osseous neural foraminal narrowing. Left C4-5 and C5-6 osseous neural foraminal narrowing. Lung apices are clear. Lateral masses articulate appropriately with the dens.  IMPRESSION: 1. Multilevel degenerative disc and facet disease.   Electronically Signed By: Bard Moats M.D. On: 10/06/2023 20:00   Shoulder Imaging: Shoulder-R MR wo contrast: No results found for this or any previous visit.  Shoulder-L MR wo contrast: No results found for this or any previous visit.  Shoulder-R DG: No results found for this or any previous visit.  Shoulder-L DG: No results found for this or any previous visit.   Thoracic Imaging: Thoracic MR wo contrast: No results found for this or any previous visit.  Thoracic MR wo contrast: No results found for this or any previous visit.  Thoracic CT wo contrast: No results found for this or any previous visit.  Thoracic DG 4 views: No results found for this or any previous visit.  Thoracic DG w/swimmers view: No results found for this or any previous  visit.   Lumbosacral Imaging: Lumbar MR wo contrast: No results found for this or any previous visit.  Lumbar MR wo contrast: No results found for this or any previous visit.  Lumbar CT wo contrast: No results found for this or any previous visit.  Lumbar DG Bending views: Results for orders placed during the hospital encounter of 10/06/23  DG Lumbar Spine Complete W/Bend  Narrative CLINICAL DATA:  Low back pain  EXAM: LUMBAR SPINE - COMPLETE WITH BENDING VIEWS  COMPARISON:  Lumbar spine radiograph 09/19/2020  FINDINGS: Grade 1 anterolisthesis of L4 on L5. Multilevel degenerative disc disease throughout the lumbar spine most pronounced L3-4 and L5-S1. Preservation of the vertebral body heights. Lower lumbar spine facet degenerative changes. SI joints unremarkable.  IMPRESSION: Multilevel degenerative disc and facet disease.   Electronically Signed By: Bard Moats M.D. On: 10/06/2023 20:02         Sacroiliac Joint Imaging: Sacroiliac Joint DG: No results found for this or any previous visit.   Hip Imaging: Hip-R MR wo contrast: No results found for this or any previous visit.  Hip-L MR wo contrast: No results found for this or any previous visit.  Hip-R CT wo contrast: No results found for this or any previous visit.  Hip-L CT wo contrast: No results found for this or any previous visit.  Hip-R DG 2-3 views: No results found for this or any previous visit.  Hip-L DG 2-3 views: No results found for this or  any previous visit.  Hip-B DG Bilateral: No results found for this or any previous visit.  Hip-B DG Bilateral (5V): No results found for this or any previous visit.   Knee Imaging: Knee-R MR wo contrast: No results found for this or any previous visit.  Knee-L MR wo contrast: No results found for this or any previous visit.  Knee-R CT wo contrast: No results found for this or any previous visit.  Knee-L CT wo contrast: No results found for this or any  previous visit.  Knee-R DG 4 views: Results for orders placed during the hospital encounter of 10/06/23  DG Knee Complete 4 Views Right  Narrative CLINICAL DATA:  Right knee pain  EXAM: DG KNEE COMPLETE 4+V*R*  COMPARISON:  None Available.  FINDINGS: There is severe tricompartmental osteoarthritis most pronounced within the medial compartment. No acute osseous abnormality. No joint effusion. Regional soft tissues unremarkable.  IMPRESSION: Severe tricompartmental osteoarthritis.   Electronically Signed By: Bard Moats M.D. On: 10/06/2023 20:04  Knee-L DG 4 views: Results for orders placed during the hospital encounter of 10/06/23  DG Knee Complete 4 Views Left  Narrative CLINICAL DATA:  Left knee pain  EXAM: LEFT KNEE - COMPLETE 4+ VIEW  COMPARISON:  Knee radiograph 09/19/2020  FINDINGS: There is severe tricompartmental osteoarthritis most pronounced in the medial compartment. No evidence for acute fracture or dislocation. No joint effusion. Regional soft tissues unremarkable.  IMPRESSION: Severe tricompartmental osteoarthritis.   Electronically Signed By: Bard Moats M.D. On: 10/06/2023 20:05   Ankle Imaging: Ankle-R DG Complete: No results found for this or any previous visit.  Ankle-L DG Complete: No results found for this or any previous visit.   Foot Imaging: Foot-R DG Complete: No results found for this or any previous visit.  Foot-L DG Complete: No results found for this or any previous visit.   Elbow Imaging: Elbow-R DG Complete: No results found for this or any previous visit.  Elbow-L DG Complete: No results found for this or any previous visit.   Wrist Imaging: Wrist-R DG Complete: No results found for this or any previous visit.  Wrist-L DG Complete: No results found for this or any previous visit.   Hand Imaging: Hand-R DG Complete: No results found for this or any previous visit.  Hand-L DG Complete: No results found for  this or any previous visit.   Complexity Note: Imaging results reviewed.                         Meds   Current Outpatient Medications:    acetaminophen  (TYLENOL ) 325 MG tablet, Take 975 mg by mouth every 6 (six) hours as needed for moderate pain (pain score 4-6)., Disp: , Rfl:    amLODipine  (NORVASC ) 10 MG tablet, TAKE 1 TABLET BY MOUTH DAILY, Disp: 90 tablet, Rfl: 3   atorvastatin  (LIPITOR) 10 MG tablet, TAKE 1 TABLET(10 MG) BY MOUTH DAILY, Disp: 90 tablet, Rfl: 3   benzonatate  (TESSALON ) 200 MG capsule, Take 1 capsule (200 mg total) by mouth 2 (two) times daily as needed for cough., Disp: 20 capsule, Rfl: 0   Blood Pressure Monitor DEVI, 1 Device by Does not apply route daily., Disp: 1 each, Rfl: 0   clobetasol  cream (TEMOVATE ) 0.05 %, Apply 1 application. topically 2 (two) times daily., Disp: 60 g, Rfl: 3   clotrimazole  (LOTRIMIN ) 1 % cream, Apply 1 Application topically 2 (two) times daily., Disp: 60 g, Rfl: 1   Cranberry 1000  MG CAPS, Take 1,000 mg by mouth daily., Disp: , Rfl:    Cyanocobalamin  (B-12 PO), Take 1 capsule by mouth daily., Disp: , Rfl:    esomeprazole  (NEXIUM ) 40 MG capsule, Take 1 capsule (40 mg total) by mouth daily., Disp: 90 capsule, Rfl: 1   estradiol  (VIVELLE -DOT) 0.0375 MG/24HR, APPLY 1 PATCH TOPICALLY TO THE SKIN 2 TIMES A WEEK, Disp: 24 patch, Rfl: 1   fenofibrate  (TRICOR ) 145 MG tablet, TAKE 1 TABLET BY MOUTH EVERY DAY WITH DINNER, Disp: 90 tablet, Rfl: 3   hydrochlorothiazide  (HYDRODIURIL ) 12.5 MG tablet, Take 1 tablet (12.5 mg total) by mouth daily., Disp: 90 tablet, Rfl: 3   HYDROcodone-acetaminophen  (NORCO/VICODIN) 5-325 MG tablet, Take 1 tablet by mouth every 8 (eight) hours as needed., Disp: , Rfl:    Lidocaine  (BLUE-EMU PAIN RELIEF DRY EX), Apply 1 Application topically daily as needed (pain)., Disp: , Rfl:    losartan  (COZAAR ) 100 MG tablet, TAKE 1 TABLET(100 MG) BY MOUTH DAILY, Disp: 90 tablet, Rfl: 1   meloxicam  (MOBIC ) 15 MG tablet, Take 1 tablet (15  mg total) by mouth daily., Disp: 30 tablet, Rfl: 1   metoprolol  succinate (TOPROL -XL) 25 MG 24 hr tablet, Take 1 tablet (25 mg total) by mouth daily., Disp: 90 tablet, Rfl: 3   Multiple Vitamin (MULTIVITAMIN PO), Take by mouth., Disp: , Rfl:    mupirocin  ointment (BACTROBAN ) 2 %, Apply 1 Application topically 2 (two) times daily., Disp: 30 g, Rfl: 1   nystatin  cream (MYCOSTATIN ), Apply 1 Application topically 2 (two) times daily., Disp: 30 g, Rfl: 0   predniSONE  (DELTASONE ) 5 MG tablet, Take 4 tablets (20 mg total) by mouth daily with breakfast for 3 days, THEN 3 tablets (15 mg total) daily with breakfast for 3 days, THEN 2 tablets (10 mg total) daily with breakfast for 3 days, THEN 1 tablet (5 mg total) daily with breakfast for 3 days., Disp: 30 tablet, Rfl: 0   pregabalin (LYRICA) 225 MG capsule, Take 225 mg by mouth 3 (three) times daily., Disp: , Rfl:    progesterone  (PROMETRIUM ) 100 MG capsule, Take 1 capsule (100 mg total) by mouth daily., Disp: 90 capsule, Rfl: 1   Secukinumab  (COSENTYX  UNOREADY) 300 MG/2ML SOAJ, Inject 300 mg into the skin every 28 (twenty-eight) days. **ALL REFILLS NEED TO BE ESCRIBED to [facility] PT ASSIST[facility]**, Disp: 6 mL, Rfl: 0   Semaglutide , 2 MG/DOSE, 8 MG/3ML SOPN, Inject 2 mg as directed once a week., Disp: 3 mL, Rfl: 2   tiZANidine (ZANAFLEX) 2 MG tablet, Take 2 mg by mouth every 6 (six) hours as needed for muscle spasms (takes only as needed)., Disp: , Rfl:   ROS  Constitutional: Denies any fever or chills Gastrointestinal: No reported hemesis, hematochezia, vomiting, or acute GI distress Musculoskeletal: Denies any acute onset joint swelling, redness, loss of ROM, or weakness Neurological: No reported episodes of acute onset apraxia, aphasia, dysarthria, agnosia, amnesia, paralysis, loss of coordination, or loss of consciousness  Allergies  Elizabeth Patrick is allergic to alcohol, dilantin [phenytoin], and methotrexate.  PFSH  Drug: Elizabeth Patrick  reports current  drug use. Drug: Hydrocodone. Alcohol:  reports that she does not currently use alcohol. Tobacco:  reports that she quit smoking about 18 years ago. Her smoking use included cigarettes. She started smoking about 28 years ago. She has a 10 pack-year smoking history. She has been exposed to tobacco smoke. She has never used smokeless tobacco. Medical:  has a past medical history of Abnormal uterine bleeding, Anemia,  Blood transfusion without reported diagnosis, Complication of anesthesia, Diabetes mellitus without complication (HCC), Dysmenorrhea, Endometriosis, Fibroid, Genital warts, GERD (gastroesophageal reflux disease), Hypertension, PONV (postoperative nausea and vomiting), RA (rheumatoid arthritis) (HCC), and Radiculopathy. Surgical: Elizabeth Patrick  has a past surgical history that includes Abdominal hysterectomy; Parathyroidectomy; Colonoscopy with propofol  (N/A, 06/26/2022); Esophagogastroduodenoscopy (egd) with propofol  (N/A, 06/26/2022); Carpometacarpel [facility]) suspension plasty (Left, 02/13/2023); and Closed reduction finger with percutaneous pinning (Left, 02/13/2023). Family: family history includes Bipolar disorder in her mother; Breast cancer (age of onset: 4) in her maternal grandmother; Coronary artery disease in her mother; Heart attack in her father; Hypertension in her father; Multiple sclerosis in her mother; Obesity in her mother; Vascular Disease in her father.  Constitutional Exam  General appearance: Well nourished, well developed, and well hydrated. In no apparent acute distress There were no vitals filed for this visit. BMI Assessment: Estimated body mass index is 59.53 kg/m as calculated from the following:   Height as of 09/29/23: 5' 4 (1.626 m).   Weight as of 09/29/23: 346 lb 12.8 oz (157.3 kg).  BMI interpretation table: BMI level Category Range association with higher incidence of chronic pain  <18 kg/m2 Underweight   18.5-24.9 kg/m2 Ideal body weight   25-29.9 kg/m2  Overweight Increased incidence by 20%  30-34.9 kg/m2 Obese (Class I) Increased incidence by 68%  35-39.9 kg/m2 Severe obesity (Class II) Increased incidence by 136%  >40 kg/m2 Extreme obesity (Class III) Increased incidence by 254%   Patient's current BMI Ideal Body weight  There is no height or weight on file to calculate BMI. Patient weight not recorded   BMI Readings from Last 4 Encounters:  09/29/23 59.53 kg/m  09/24/23 59.39 kg/m  09/23/23 59.24 kg/m  06/25/23 60.25 kg/m   Wt Readings from Last 4 Encounters:  09/29/23 (!) 346 lb 12.8 oz (157.3 kg)  09/24/23 (!) 346 lb (156.9 kg)  09/23/23 (!) 345 lb 1.9 oz (156.5 kg)  06/25/23 (!) 351 lb (159.2 kg)    Psych/Mental status: Alert, oriented x 3 (person, place, & time)       Eyes: PERLA Respiratory: No evidence of acute respiratory distress  Assessment & Plan  Primary Diagnosis & Pertinent Problem List: The primary encounter diagnosis was Chronic knee pain (Bilateral). Diagnoses of Chronic hip pain (Right), Chronic neck pain, Cervicogenic headache (Right), Chronic low back pain (Bilateral) w/o sciatica, Primary osteoarthritis involving multiple joints, History of psoriatic arthritis, and Polyarticular psoriatic arthritis (HCC) were also pertinent to this visit. Visit Diagnosis: 1. Chronic knee pain (Bilateral)   2. Chronic hip pain (Right)   3. Chronic neck pain   4. Cervicogenic headache (Right)   5. Chronic low back pain (Bilateral) w/o sciatica   6. Primary osteoarthritis involving multiple joints   7. History of psoriatic arthritis   8. Polyarticular psoriatic arthritis (HCC)    Problems updated and reviewed during this visit: Problem  History of Psoriatic Arthritis  Polyarticular Psoriatic Arthritis (Hcc)  Psoriatic Arthritis (Hcc)    Plan of Care  Assessment and Plan             Pharmacotherapy (Medications Ordered): No orders of the defined types were placed in this encounter.  Procedure Orders     No procedure(s) ordered today   No orders of the defined types were placed in this encounter.  Lab Orders  No laboratory test(s) ordered today   Imaging Orders  No imaging studies ordered today   Referral Orders  No referral(s) requested today  Pharmacological management:  Opioid Analgesics: I will not be prescribing any opioids at this time Membrane stabilizer: I will not be prescribing any at this time Muscle relaxant: I will not be prescribing any at this time NSAID: I will not be prescribing any at this time Other analgesic(s): I will not be prescribing any at this time      Interventional Therapies  Risk Factors  Considerations  Medical Comorbidities:     Planned  Pending:      Under consideration:   Pending   Completed: (Analgesic benefit)1  None at this time   Therapeutic  Palliative (PRN) options:   None established   Completed by other providers:   The patient refers also having had procedures done at [facility] and [facility]  Patient of [facility])  Procedure Date Performed Result  Cervical/Thoracic - single, epidural injection 10/08/2022 no relief  RFA, Lumbar (Check blood thinner status) 10/11/2022 N/A  Lumbar or Sacral - single, destruction nerve 10/23/2022 90% on left, worse on right  Lumbar or Sacral - additional, destruction 10/23/2022 N/A  Joint Injection 12/11/2022 N/A  Major joint or bursa injection 12/16/2022 N/A  ESI, Cervical Interlaminar (Check blood thinner status) 03/18/2023 N/A  Joint Injection 03/18/2023 N/A  Cervical/Thoracic - single, epidural injection 04/15/2023 25% Relief Ongoing  RFA, Lumbar (Check blood thinner status) 05/13/2023 06/03/23 Blue Medicare  Lumbar or Sacral - single, destruction nerve 06/03/2023 90% Relief Ongoing  Joint Injection 07/30/2023 *blue medicare 08/06/23    1(Analgesic benefit): Expressed in percentage (%). (Local anesthetic[LA] +/- sedation  L.A.Local Anesthetic  Steroid  benefit  Ongoing benefit)      Provider-requested follow-up: No follow-ups on file. Recent Visits Date Type Provider Dept  09/24/23 Office Visit Tanya Glisson, MD [facility]  Showing recent visits within past 90 days and meeting all other requirements Future Appointments Date Type Provider Dept  11/03/23 Appointment Tanya Glisson, MD [facility]  Showing future appointments within next 90 days and meeting all other requirements   Primary Care Physician: Chandra Toribio POUR, MD  Duration of encounter: *** minutes.  Total time on encounter, as per AMA guidelines included both the face-to-face and non-face-to-face time personally spent by the physician and/or other qualified health care professional(s) on the day of the encounter (includes time in activities that require the physician or other qualified health care professional and does not include time in activities normally performed by clinical staff). Physician's time may include the following activities when performed: Preparing to see the patient (e.g., pre-charting review of records, searching for previously ordered imaging, lab work, and nerve conduction tests) Review of prior analgesic pharmacotherapies. Reviewing PMP Interpreting ordered tests (e.g., lab work, imaging, nerve conduction tests) Performing post-procedure evaluations, including interpretation of diagnostic procedures Obtaining and/or reviewing separately obtained history Performing a medically appropriate examination and/or evaluation Counseling and educating the patient/family/caregiver Ordering medications, tests, or procedures Referring and communicating with other health care professionals (when not separately reported) Documenting clinical information in the electronic or other health record Independently interpreting results (not separately reported) and communicating results to the patient/ family/caregiver Care coordination (not  separately reported)  Note by: Glisson DELENA Tanya, MD (TTS technology used. I apologize for any typographical errors that were not detected and corrected.) Date: 11/03/2023; Time: 11:12 AM

## 2023-11-03 ENCOUNTER — Ambulatory Visit: Attending: Pain Medicine | Admitting: Pain Medicine

## 2023-11-03 ENCOUNTER — Encounter: Payer: Self-pay | Admitting: Pain Medicine

## 2023-11-03 VITALS — BP 114/61 | HR 60 | Temp 97.2°F | Ht 64.0 in | Wt 335.0 lb

## 2023-11-03 DIAGNOSIS — L4050 Arthropathic psoriasis, unspecified: Secondary | ICD-10-CM | POA: Insufficient documentation

## 2023-11-03 DIAGNOSIS — M542 Cervicalgia: Secondary | ICD-10-CM | POA: Insufficient documentation

## 2023-11-03 DIAGNOSIS — G8929 Other chronic pain: Secondary | ICD-10-CM | POA: Diagnosis not present

## 2023-11-03 DIAGNOSIS — L4059 Other psoriatic arthropathy: Secondary | ICD-10-CM

## 2023-11-03 DIAGNOSIS — E1142 Type 2 diabetes mellitus with diabetic polyneuropathy: Secondary | ICD-10-CM | POA: Diagnosis not present

## 2023-11-03 DIAGNOSIS — Z872 Personal history of diseases of the skin and subcutaneous tissue: Secondary | ICD-10-CM

## 2023-11-03 DIAGNOSIS — M79604 Pain in right leg: Secondary | ICD-10-CM | POA: Diagnosis not present

## 2023-11-03 DIAGNOSIS — M79605 Pain in left leg: Secondary | ICD-10-CM | POA: Diagnosis not present

## 2023-11-03 DIAGNOSIS — M545 Low back pain, unspecified: Secondary | ICD-10-CM | POA: Diagnosis not present

## 2023-11-03 DIAGNOSIS — G4486 Cervicogenic headache: Secondary | ICD-10-CM | POA: Diagnosis not present

## 2023-11-03 DIAGNOSIS — R1031 Right lower quadrant pain: Secondary | ICD-10-CM | POA: Insufficient documentation

## 2023-11-03 DIAGNOSIS — M25561 Pain in right knee: Secondary | ICD-10-CM | POA: Diagnosis not present

## 2023-11-03 DIAGNOSIS — M150 Primary generalized (osteo)arthritis: Secondary | ICD-10-CM

## 2023-11-03 DIAGNOSIS — M25562 Pain in left knee: Secondary | ICD-10-CM | POA: Insufficient documentation

## 2023-11-03 DIAGNOSIS — M25551 Pain in right hip: Secondary | ICD-10-CM | POA: Diagnosis not present

## 2023-11-03 NOTE — Progress Notes (Signed)
 Safety precautions to be maintained throughout the outpatient stay will include: orient to surroundings, keep bed in low position, maintain call bell within reach at all times, provide assistance with transfer out of bed and ambulation.

## 2023-11-03 NOTE — Patient Instructions (Signed)
 ______________________________________________________________________    Patient information on: Body mass index (BMI) and Weight Management  Dear Ms. Farra you are receiving this information because your weight may be adversely affecting your health.   Your current Estimated body mass index is 57.5 kg/m as calculated from the following:   Height as of this encounter: 5' 4 (1.626 m).   Weight as of this encounter: 335 lb (152 kg).  We recommend you talk to your primary care physician about providing or referring you to a supervised weight management program.  Here is some information about weight and the body mass index (BMI) classification:  BMI is a measure of obesity that's calculated by dividing a person's weight in kilograms by their height in meters squared. A person can use an online calculator to determine their BMI. Body mass index (BMI) is a common tool for deciding whether a person has an appropriate body weight.  It measures a person's weight in relation to their height.  According to the [facility] of health (NIH): A BMI of less than 18.5 means that a person is underweight. A BMI of between 18.5 and 24.9 is ideal. A BMI of between 25 and 29.9 is overweight. A BMI over 30 indicates obesity.  Body Mass Index (BMI) Classification BMI level (kg/m2) Category Associated incidence of chronic pain  <18  Underweight   18.5-24.9 Ideal body weight   25-29.9 Overweight  20%  30-34.9 Obese (Class I)  68%  35-39.9 Severe obesity (Class II)  136%  >40 Extreme obesity (Class III)  254%    Morbidly Obese Classification: You will be considered to be Morbidly Obese if your BMI is above 30 and you have one or more of the following conditions caused or associated to obesity: 1.    Type 2 Diabetes (Leading to cardiovascular diseases (CVD), stroke, peripheral vascular diseases (PVD), retinopathy, nephropathy, and neuropathy) 2.    Cardiovascular Disease (High Blood Pressure;  Congestive Heart Failure; High Cholesterol; Coronary Artery Disease; Angina; Arrhythmias, Dysrhythmias, or Heart Attacks) 3.    Breathing problems (Asthma; obesity-hypoventilation syndrome; obstructive sleep apnea; chronic inflammatory airway disease; reactive airway disease; or shortness of breath) 4.    Chronic kidney disease 5.    Liver disease (nonalcoholic fatty liver disease) 6.    High blood pressure 7.    Acid reflux (gastroesophageal reflux disease; heartburn) 8.    Osteoarthritis (OA) (affecting the hip(s), the knee(s) and/or the lower back) (usually requiring knee and/or hip replacements, as well as back surgeries) 9.    Low back pain (Lumbar Facet Syndrome; and/or Degenerative Disc Disease) 10.  Hip pain (Osteoarthritis of hip) (For every 1 lbs of added body weight, there is a 2 lbs increase in pressure inside of each hip articulation. 1:2 mechanical relationship) 11.  Knee pain (Osteoarthritis of knee) (For every 1 lbs of added body weight, there is a 4 lbs increase in pressure inside of each knee articulation. 1:4 mechanical relationship) (patients with a BMI>30 kg/m2 were 6.8 times more likely to develop knee OA than normal-weight individuals) 12.  Cancer: Epidemiological studies have shown that obesity is a risk factor for: post-menopausal breast cancer; cancers of the endometrium, colon and kidney cancer; malignant adenomas of the esophagus. Obese subjects have an approximately 1.5-3.5-fold increased risk of developing these cancers compared with normal-weight subjects, and it has been estimated that between 15 and 45% of these cancers can be attributed to overweight. More recent studies suggest that obesity may also increase the risk of other types  of cancer, including pancreatic, hepatic and gallbladder cancer. (Ref: Obesity and cancer. Pischon T, Nthlings U, [employer] H. Proc Nutr Soc. 2008 May;67(2):128-45. doi: 10.1017/[id number].) The International Agency for Research on Cancer  (IARC) has identified 13 cancers associated with overweight and obesity: meningioma, multiple myeloma, adenocarcinoma of the esophagus, and cancers of the thyroid , postmenopausal breast cancer, gallbladder, stomach, liver, pancreas, kidney, ovaries, uterus, colon and rectal (colorectal) cancers. 55 percent of all cancers diagnosed in women and 24 percent of those diagnosed in men are associated with overweight and obesity.  Recommendation: If you have any of the above conditions it is urgent that you take a step back and concentrate in losing weight. Dedicate 100% of your efforts on this task. Nothing else will improve your health more than bringing your weight down and your BMI to less than 30.   Nutritionist and/or supervised weight-management program: We are aware that most chronic pain patients are unable to exercise secondary to their pain. For this reason, you must rely on proper nutrition and diet in order to lose the weight. We recommend you talk to a nutritionist.   Bariatric surgery: A person might be considered a candidate for bariatric surgery if they meet one of the following BMI criteria:  BMI of 40 or higher: This is considered extreme obesity (Class III). BMI of 35-39.9: This is considered obesity, and the person might also have a serious weight-related health condition, such as high blood pressure, type 2 diabetes, or severe sleep apnea  BMI of 30-34.9: This might be considered if the person has serious weight-related health problems and hasn't had substantial weight loss or improvement in co-morbidities through other methods   On your own: A realistic goal is to lose 10% of your body weight over a period of 12 months.  If over a period of six (6) months you have unsuccessfully tried to lose weight, then it is time for you to seek professional help and to enter a medically supervised weight management program, and/or undergo bariatric surgery.   Pain management considerations and  possible limitations:  1.    Pharmacological Problems: Be advised that the use of opioid analgesics (oxycodone ; hydrocodone; morphine; methadone; codeine; and all of their derivatives) have been associated with decreased metabolism and weight gain.  For this reason, should we see that you are unable to lose weight while taking these medications, it may become necessary for us  to taper down and indefinitely discontinue them.  2.    Technical Problems: The incidence of successful interventional therapies decreases as the patient's BMI increases. It is much more difficult to accomplish a safe and effective interventional therapy on a patient with a BMI above 35. 3.    Radiation Exposure Problems: The x-rays machine, used to accomplish injection therapies, will automatically increase their x-ray output in order to capture an appropriate bone image. This means that radiation exposure increases exponentially with the patient's BMI. (The higher the BMI, the higher the radiation exposure.) Although the level of radiation used at a given time is still safe to the patient, it is not for the physician and/or assisting staff. Unfortunately, radiation exposure is accumulative. Because physicians and the staff have to do procedures and be exposed on a daily basis, this can result in health problems such as cancer and radiation burns. Radiation exposure to the staff is monitored by the radiation batches that they wear. The exposure levels are reported back to the staff on a quarterly basis. Depending on levels of exposure, physicians  and staff may be obligated by law to decrease this exposure. This means that they have the right and obligation to refuse providing therapies where they may be overexposed to radiation. For this reason, physicians may decline to offer therapies such as radiofrequency ablation or implants to patients with a BMI above 40. 4.    Current Trends: Be advised that the current trend is to no longer offer  certain therapies to patients with a BMI equal to, or above 35, due to increase perioperative risks, increased technical procedural difficulties, and excessive radiation exposure to healthcare personnel.  Last updated: 10/07/2022 ______________________________________________________________________     ______________________________________________________________________    Steroid injections  Common steroids for injections Triamcinolone : Used by many sports medicine physicians for large joint and bursal injections, often combined with a local anesthetic like lidocaine . A study focusing on coccydynia (tailbone pain) found triamcinolone  was more effective than betamethasone , suggesting it may also be preferable for other localized inflammation conditions. Methylprednisolone : A common alternative to triamcinolone  that is also a strong anti-inflammatory. It is available in different formulations, with the acetate suspension being the long-acting option for intra-articular injections. Dexamethasone : This is a non-particulate steroid, meaning it has a lower risk of tissue damage compared to particulate steroids like triamcinolone  and methylprednisolone . While less common for this specific use, it is an option for targeted injections.   Considerations for physicians Particulate vs. non-particulate steroids: Triamcinolone  and methylprednisolone  are particulate, meaning they can clump together. Dexamethasone  is non-particulate. Particulate steroids are often preferred for their longer-lasting effects but carry a theoretical higher risk for certain injections (though this is less of a concern in the costochondral joints). Combined injectate: Corticosteroids are typically mixed with a local anesthetic like lidocaine  to provide both immediate pain relief (from the anesthetic) and longer-term inflammation reduction (from the steroid). Imaging guidance: To ensure accurate placement of the needle and  medication, physicians may use ultrasound or fluoroscopic guidance for the injection, especially in complex or refractory cases.   Patient guidance Before undergoing a steroid injection, discuss the options with your physician. They will determine the best steroid, dosage, and procedure for your specific case based on factors like: Severity of your condition History of response to other treatments Your overall health status Experience and preference of the physician  Last  Updated: 09/09/2023 ______________________________________________________________________     ______________________________________________________________________    Opioid Pain Medication Update  To: All patients taking opioid pain medications. (I.e.: hydrocodone, hydromorphone, oxycodone , oxymorphone, morphine, codeine, methadone, tapentadol, tramadol, buprenorphine, fentanyl , etc.)  Re: Updated review of side effects and adverse reactions of opioid analgesics, as well as new information about long term effects of this class of medications.  Direct risks of long-term opioid therapy are not limited to opioid addiction and overdose. Potential medical risks include serious fractures, breathing problems during sleep, hyperalgesia, immunosuppression, chronic constipation, bowel obstruction, myocardial infarction, and tooth decay secondary to xerostomia.  Unpredictable adverse effects that can occur even if you take your medication correctly: Cognitive impairment, respiratory depression, and death. Most people think that if they take their medication correctly, and as instructed, that they will be safe. Nothing could be farther from the truth. In reality, a significant amount of recorded deaths associated with the use of opioids has occurred in individuals that had taken the medication for a long time, and were taking their medication correctly. The following are examples of how this can happen: Patient taking his/her  medication for a long time, as instructed, without any side effects, is given a  certain antibiotic or another unrelated medication, which in turn triggers a Drug-to-drug interaction leading to disorientation, cognitive impairment, impaired reflexes, respiratory depression or an untoward event leading to serious bodily harm or injury, including death.  Patient taking his/her medication for a long time, as instructed, without any side effects, develops an acute impairment of liver and/or kidney function. This will lead to a rapid inability of the body to breakdown and eliminate their pain medication, which will result in effects similar to an overdose, but with the same medicine and dose that they had always taken. This again may lead to disorientation, cognitive impairment, impaired reflexes, respiratory depression or an untoward event leading to serious bodily harm or injury, including death.  A similar problem will occur with patients as they grow older and their liver and kidney function begins to decrease as part of the aging process.  Background information: Historically, the original case for using long-term opioid therapy to treat chronic noncancer pain was based on safety assumptions that subsequent experience has called into question. In 1996, the American Pain Society and the American Academy of Pain Medicine issued a consensus statement supporting long-term opioid therapy. This statement acknowledged the dangers of opioid prescribing but concluded that the risk for addiction was low; respiratory depression induced by opioids was short-lived, occurred mainly in opioid-naive patients, and was antagonized by pain; tolerance was not a common problem; and efforts to control diversion should not constrain opioid prescribing. This has now proven to be wrong. Experience regarding the risks for opioid addiction, misuse, and overdose in community practice has failed to support these  assumptions.  According to the Centers for Disease Control and Prevention, fatal overdoses involving opioid analgesics have increased sharply over the past decade. Currently, more than 96,700 people die from drug overdoses every year. Opioids are a factor in 7 out of every 10 overdose deaths. Deaths from drug overdose have surpassed motor vehicle accidents as the leading cause of death for individuals between the ages of 85 and 70.  Clinical data suggest that neuroendocrine dysfunction may be very common in both men and women, potentially causing hypogonadism, erectile dysfunction, infertility, decreased libido, osteoporosis, and depression. Recent studies linked higher opioid dose to increased opioid-related mortality. Controlled observational studies reported that long-term opioid therapy may be associated with increased risk for cardiovascular events. Subsequent meta-analysis concluded that the safety of long-term opioid therapy in elderly patients has not been proven.   Side Effects and adverse reactions: Common side effects: Drowsiness (sedation). Dizziness. Nausea and vomiting. Constipation. Physical dependence -- Dependence often manifests with withdrawal symptoms when opioids are discontinued or decreased. Tolerance -- As you take repeated doses of opioids, you require increased medication to experience the same effect of pain relief. Respiratory depression -- This can occur in healthy people, especially with higher doses. However, people with COPD, asthma or other lung conditions may be even more susceptible to fatal respiratory impairment.  Uncommon side effects: An increased sensitivity to feeling pain and extreme response to pain (hyperalgesia). Chronic use of opioids can lead to this. Delayed gastric emptying (the process by which the contents of your stomach are moved into your small intestine). Muscle rigidity. Immune system and hormonal dysfunction. Quick, involuntary muscle  jerks (myoclonus). Arrhythmia. Itchy skin (pruritus). Dry mouth (xerostomia).  Long-term side effects: Chronic constipation. Sleep-disordered breathing (SDB). Increased risk of bone fractures. Hypothalamic-pituitary-adrenal dysregulation. Increased risk of overdose.  RISKS: Respiratory depression and death: Opioids increase the risk of respiratory depression and death.  Drug-to-drug interactions:  Opioids are relatively contraindicated in combination with benzodiazepines, sleep inducers, and other central nervous system depressants. Other classes of medications (i.e.: certain antibiotics and even over-the-counter medications) may also trigger or induce respiratory depression in some patients.  Medical conditions: Patients with pre-existing respiratory problems are at higher risk of respiratory failure and/or depression when in combination with opioid analgesics. Opioids are relatively contraindicated in some medical conditions such as central sleep apnea.   Fractures and Falls:  Opioids increase the risk and incidence of falls. This is of particular importance in elderly patients.  Endocrine System:  Long-term administration is associated with endocrine abnormalities (endocrinopathies). (Also known as Opioid-induced Endocrinopathy) Influences on both the hypothalamic-pituitary-adrenal axis?and the hypothalamic-pituitary-gonadal axis have been demonstrated with consequent hypogonadism and adrenal insufficiency in both sexes. Hypogonadism and decreased levels of dehydroepiandrosterone sulfate have been reported in men and women. Endocrine effects include: Amenorrhoea in women (abnormal absence of menstruation) Reduced libido in both sexes Decreased sexual function Erectile dysfunction in men Hypogonadisms (decreased testicular function with shrinkage of testicles) Infertility Depression and fatigue Loss of muscle mass Anxiety Depression Immune suppression Hyperalgesia Weight  gain Anemia Osteoporosis Patients (particularly women of childbearing age) should avoid opioids. There is insufficient evidence to recommend routine monitoring of asymptomatic patients taking opioids in the long-term for hormonal deficiencies.  Immune System: Human studies have demonstrated that opioids have an immunomodulating effect. These effects are mediated via opioid receptors both on immune effector cells and in the central nervous system. Opioids have been demonstrated to have adverse effects on antimicrobial response and anti-tumour surveillance. Buprenorphine has been demonstrated to have no impact on immune function.  Opioid Induced Hyperalgesia: Human studies have demonstrated that prolonged use of opioids can lead to a state of abnormal pain sensitivity, sometimes called opioid induced hyperalgesia (OIH). Opioid induced hyperalgesia is not usually seen in the absence of tolerance to opioid analgesia. Clinically, hyperalgesia may be diagnosed if the patient on long-term opioid therapy presents with increased pain. This might be qualitatively and anatomically distinct from pain related to disease progression or to breakthrough pain resulting from development of opioid tolerance. Pain associated with hyperalgesia tends to be more diffuse than the pre-existing pain and less defined in quality. Management of opioid induced hyperalgesia requires opioid dose reduction.  Cancer: Chronic opioid therapy has been associated with an increased risk of cancer among noncancer patients with chronic pain. This association was more evident in chronic strong opioid users. Chronic opioid consumption causes significant pathological changes in the small intestine and colon. Epidemiological studies have found that there is a link between opium dependence and initiation of gastrointestinal cancers. Cancer is the second leading cause of death after cardiovascular disease. Chronic use of opioids can cause  multiple conditions such as GERD, immunosuppression and renal damage as well as carcinogenic effects, which are associated with the incidence of cancers.   Mortality: Long-term opioid use has been associated with increased mortality among patients with chronic non-cancer pain (CNCP).  Prescription of long-acting opioids for chronic noncancer pain was associated with a significantly increased risk of all-cause mortality, including deaths from causes other than overdose.  Reference: Von Korff M, Kolodny A, Deyo RA, Chou R. Long-term opioid therapy reconsidered. Ann Intern Med. 2011 Sep 6;155(5):325-8. doi: 10.7326/[id number]. PMID: 78106373; PMCID: [id number]. Kit JINNY Laurence CINDERELLA Pearley JINNY, Hayward RA, Dunn KM, [place] KP. Risk of adverse events in patients prescribed long-term opioids: A cohort study in the [employer]. Eur J Pain. 2019 May;23(5):908-922. doi: 10.1002/ejp.1357. Epub 2019  Jan 31. PMID: 69379883. Colameco S, Coren JS, Ciervo CA. Continuous opioid treatment for chronic noncancer pain: a time for moderation in prescribing. Postgrad Med. 2009 Jul;121(4):61-6. doi: 10.3810/pgm.2009.07.2032. PMID: 80358728. Gigi JONELLE Shlomo MILUS Levern IVER Conny RN, [place], Blazina I, Lonell DASEN, Bougatsos C, Deyo RA. The effectiveness and risks of long-term opioid therapy for chronic pain: a systematic review for a [employer]ways to [employer]. Ann Intern Med. 2015 Feb 17;162(4):276-86. doi: 10.7326/M14-2559. PMID: 74418742. Rory CHRISTELLA Door [facility], [place] DM. NCHS Data Brief No. 22. [place]: Centers for Disease Control and Prevention; 2009. Sep, Increase in Fatal Poisonings Involving Opioid Analgesics in the [place] , 1999-2006. Song IA, Choi HR, Oh TK. Long-term opioid use and mortality in patients with chronic non-cancer pain: Ten-year follow-up study in [place] from 2010 through 2019. EClinicalMedicine. 2022 Jul 18;51:101558.  doi: 10.1016/j.eclinm.2022.898441. PMID: 64124182; PMCID: EFR0695089. Huser, W., Schubert, T., Vogelmann, T. et al. All-cause mortality in patients with long-term opioid therapy compared with non-opioid analgesics for chronic non-cancer pain: a database study. BMC Med 18, 162 (2020). http://lester.info/ Rashidian H, Zendehdel K, Kamangar F, Malekzadeh R, Haghdoost AA. An Ecological Study of the Association between Opiate Use and Incidence of Cancers. Addict Health. 2016 Fall;8(4):252-260. PMID: 71180443; PMCID: [id number].  Our Goal: Our goal is to control your pain with means other than the use of opioid pain medications.  Our Recommendation: Talk to your physician about coming off of these medications. We can assist you with the tapering down and stopping these medicines. Based on the new information, even if you cannot completely stop the medication, a decrease in the dose may be associated with a lesser risk. Ask for other means of controlling the pain. Decrease or eliminate those factors that significantly contribute to your pain such as smoking, obesity, and a diet heavily tilted towards inflammatory nutrients.  Last Updated: 07/22/2022   ______________________________________________________________________

## 2023-11-05 ENCOUNTER — Encounter: Payer: Self-pay | Admitting: Sports Medicine

## 2023-11-05 DIAGNOSIS — M170 Bilateral primary osteoarthritis of knee: Secondary | ICD-10-CM | POA: Diagnosis not present

## 2023-11-09 ENCOUNTER — Other Ambulatory Visit: Payer: Self-pay | Admitting: Internal Medicine

## 2023-11-10 NOTE — Telephone Encounter (Signed)
 Last Fill: 09/05/2023  Labs: 09/29/2023 CBC/CMP WNL  Next Visit: 12/30/2023  Last Visit: 09/29/2023  DX: Psoriasis and similar disorder   Current Dose per office note 09/29/2023: Cosentyx  300 mg [place] monthly   Okay to refill Meloxicam ?

## 2023-11-13 ENCOUNTER — Other Ambulatory Visit: Payer: Self-pay

## 2023-11-13 ENCOUNTER — Encounter: Payer: Self-pay | Admitting: Sports Medicine

## 2023-11-13 ENCOUNTER — Ambulatory Visit (INDEPENDENT_AMBULATORY_CARE_PROVIDER_SITE_OTHER): Admitting: Sports Medicine

## 2023-11-13 DIAGNOSIS — E66813 Obesity, class 3: Secondary | ICD-10-CM | POA: Diagnosis not present

## 2023-11-13 DIAGNOSIS — M160 Bilateral primary osteoarthritis of hip: Secondary | ICD-10-CM | POA: Diagnosis not present

## 2023-11-13 DIAGNOSIS — Z6841 Body Mass Index (BMI) 40.0 and over, adult: Secondary | ICD-10-CM

## 2023-11-13 DIAGNOSIS — M25552 Pain in left hip: Secondary | ICD-10-CM | POA: Diagnosis not present

## 2023-11-13 DIAGNOSIS — M1612 Unilateral primary osteoarthritis, left hip: Secondary | ICD-10-CM | POA: Diagnosis not present

## 2023-11-13 DIAGNOSIS — G8929 Other chronic pain: Secondary | ICD-10-CM

## 2023-11-13 MED ORDER — LIDOCAINE HCL 1 % IJ SOLN
4.0000 mL | INTRAMUSCULAR | Status: AC | PRN
  Administered 2023-11-13: 4 mL

## 2023-11-13 MED ORDER — METHYLPREDNISOLONE ACETATE 40 MG/ML IJ SUSP
40.0000 mg | INTRAMUSCULAR | Status: AC | PRN
  Administered 2023-11-13: 40 mg via INTRA_ARTICULAR

## 2023-11-13 NOTE — Progress Notes (Signed)
 Elizabeth Patrick - 53 y.o. female MRN [id number]  Date of birth: 1970/01/24  Office Visit Note: Visit Date: 11/13/2023 PCP: Chandra Toribio POUR, MD Referred by: Chandra Toribio POUR, MD  Subjective: Chief Complaint  Patient presents with   Left Hip - Pain   HPI: Elizabeth Patrick is a pleasant 53 y.o. female who presents today for f/u of chronic bilateral hip osteoarthritis and A/C left hip pain.  Hips - the hips are still painful but the right hip is certainly doing better after intra-articular injection on 10/20/2023.  She was in an overall joint flare and so was prescribed a prednisone  Dosepak earlier in the month.  Recent weather changes have made the hips more painful, although she does admit the hip is certainly.  She would like to move forward for left hip injection today.  She does continue following with pain management, continues meloxicam  15 mg daily, Lyrica 225 mg TID.  Weight - last weight at annual physical 345 pounds, BMI: 59.24.  She does admit to struggling with her weight for years, she has a notable family history mother and father who are overweight.  She has worked very hard to lose weight on her own with dietary changes, calorie restriction, exercise.  She has lost 75 pounds to date but still has a weight goal of an additional 120 pounds.  She has been on Ozempic  and did increase from 1 mg to 2 mg once weekly.  Elizabeth Patrick did show me today she has filled out all the paperwork and is moving forward with getting scheduled for bariatric surgery.  Pertinent ROS were reviewed with the patient and found to be negative unless otherwise specified above in HPI.   Assessment & Plan: Visit Diagnoses:  1. Bilateral primary osteoarthritis of hip   2. Chronic left hip pain   3. Class 3 severe obesity with body mass index (BMI) of 50.0 to 59.9 in adult, unspecified obesity type, unspecified whether serious comorbidity present [facility])    Plan: Impression is severe bilateral hip osteoarthritis with  left hip pain exacerbation.  Previously she did receive relief from right hip intra-articular injection which did not resolve her pain but certainly improved.  Through shared decision making, we did proceed with left hip ultrasound-guided injection, patient tolerated well.  Advised on postinjection protocol.  She may use ice/heat and/or Tylenol  for any postinjection pain.  For her hips and overall joint pain she may continue her meloxicam  15 mg daily as well as her Lyrica to 25 mg 3 times daily which she receives from pain management.  [name] is moving forward with setting up bariatric surgery and attempt to help further with her weight loss, which I think is a good step for her given that she has worked hard on weight loss on her own measures with dietary lifestyle changes, as well as her Ozempic  2 mg IM weekly.  She has lost 75 pounds to date but would need an additional 120 pounds to get to an overall weight goal for any operative intervention for the hips.  She will let me know how she does from the injections, she will follow-up with me as needed.  Discussed the role for infrequent injections greater than 3 months apart for therapeutic benefit.  Follow-up: Return if symptoms worsen or fail to improve.   Meds & Orders: No orders of the defined types were placed in this encounter.   Orders Placed This Encounter  Procedures   Large Joint Inj: L hip joint  US  Guided Needle Placement - No Linked Charges     Procedures: Large Joint Inj: L hip joint on 11/13/2023 9:03 AM Indications: pain Details: 22 G 3.5 in needle, ultrasound-guided anterior approach Medications: 4 mL lidocaine  1 %; 40 mg methylPREDNISolone  acetate 40 MG/ML Outcome: tolerated well, no immediate complications  Procedure: US -guided intra-articular hip injection, Left After discussion on risks/benefits/indications and informed verbal consent was obtained, a timeout was performed. Patient was lying supine on exam table. The hip was  cleaned with betadine and alcohol swabs. Then utilizing ultrasound guidance, the patient's femoral head and neck junction was identified and subsequently injected with 4:2 lidocaine :depomedrol via an in-plane approach with ultrasound visualization of the injectate administered into the hip joint. Patient tolerated procedure well without immediate complications.  Procedure, treatment alternatives, risks and benefits explained, specific risks discussed. Consent was given by the patient. Immediately prior to procedure a time out was called to verify the correct patient, procedure, equipment, support staff and site/side marked as required. Patient was prepped and draped in the usual sterile fashion.          Clinical History: No specialty comments available.  She reports that she quit smoking about 18 years ago. Her smoking use included cigarettes. She started smoking about 28 years ago. She has a 10 pack-year smoking history. She has been exposed to tobacco smoke. She has never used smokeless tobacco.  Recent Labs    12/16/22 1442 09/23/23 0954  HGBA1C 5.2 5.1    Objective:    Physical Exam  Gen: Well-appearing, in no acute distress; non-toxic CV: Well-perfused. Warm.  Resp: Breathing unlabored on room air; no wheezing. Psych: Fluid speech in conversation; appropriate affect; normal thought process  Ortho Exam - Bilateral hips: There is no redness swelling or effusion of the left hip joint.  Patient ambulates with assistance of a rolling walker.  There is an antalgic gait.  There is pain with hip flexion flexion as well as internal and external logroll which is limited with bony blocking.  Imaging:  *I did review bilateral hip x-ray imaging today during the visit.  Narrative & Impression  CLINICAL DATA:  Chronic bilateral hip pain   EXAM: DG HIP (WITH OR WITHOUT PELVIS) 3-4V BILAT   COMPARISON:  Renal stone CT 12/15/2020; pelvic radiograph 06/25/2023   FINDINGS: Redemonstrated  severe bilateral hip joint degenerative changes, right greater than left. No acute osseous abnormality. SI joints unremarkable. Lumbar spine degenerative changes.   IMPRESSION: Severe bilateral hip joint degenerative changes, right greater than left.     Electronically Signed   By: Bard Moats M.D.   On: 10/06/2023 20:04      Past Medical/Family/Surgical/Social History: Medications & Allergies reviewed per EMR, new medications updated. Patient Active Problem List   Diagnosis Date Noted   Chronic lower extremity pain (1ry area of Pain) (Bilateral) 11/03/2023   Chronic groin pain (4th area of Pain) (Right) 11/03/2023   History of psoriatic arthritis 11/02/2023   Polyarticular psoriatic arthritis (HCC) 11/02/2023   Psoriatic arthritis (HCC) 09/29/2023   Healthcare maintenance 09/29/2023   Chronic low back pain (7th area of Pain) (Bilateral) w/o sciatica 09/24/2023   Chronic knee pain (2ry area of Pain) (Bilateral) 09/24/2023   Cervicalgia 09/24/2023   Cervicogenic headache (6th area of Pain) (Right) 09/24/2023   Osteoarthritis involving multiple joints 09/24/2023   Chronic pain syndrome 09/23/2023   Pharmacologic therapy 09/23/2023   Disorder of skeletal system 09/23/2023   Problems influencing health status 09/23/2023   Cervical radiculopathy  09/23/2023   Long term (current) use of opiate analgesic 09/23/2023   Chronic neck pain (5th area of Pain) (Bilateral) 09/23/2023   Osteoarthritis of knees (Bilateral) 09/23/2023   Wrist pain, right 09/23/2023   Lumbosacral spondylosis without myelopathy 09/23/2023   Polyneuropathy due to type 2 diabetes mellitus (HCC) 09/23/2023   Encounter for long-term opiate analgesic use 09/23/2023   Abnormal MRI, cervical spine (06/18/2022) 09/23/2023   Dermatitis due to sun 06/23/2023   High risk medication use 04/08/2023   Vitamin D  deficiency 04/08/2023   Screening for tuberculosis 04/08/2023   Intertrigo 12/16/2022   Chronic idiopathic  constipation 09/06/2022   Gastroparesis 06/26/2022   Chronic hip pain (3ry area of Pain) (Right) 12/15/2021   DDD (degenerative disc disease), lumbosacral 12/15/2021   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/2021   Vitamin B12 deficiency 07/23/2021   Severe sleep apnea 07/04/2021   Psoriasis and similar disorder 05/27/2021   Depression, major, single episode, moderate (HCC) 04/29/2021   Loud snoring 04/17/2021   Menopause 04/16/2021   Morbid obesity (HCC) 04/05/2021   Type 2 diabetes mellitus without complication, with long-term current use of insulin  (HCC) 11/28/2020   Gastroesophageal reflux disease 11/28/2020   Recurrent UTI 10/17/2020   Hyperlipidemia associated with type 2 diabetes mellitus (HCC) 10/11/2020   Lower extremity edema (Bilateral) 10/11/2020   Diabetic polyneuropathy associated with type 2 diabetes mellitus (HCC) 10/11/2020   NASH (nonalcoholic steatohepatitis) 10/10/2020   Hypertension associated with diabetes (HCC) 09/26/2020   Past Medical History:  Diagnosis Date   Abnormal uterine bleeding    Anemia    Blood transfusion without reported diagnosis    Complication of anesthesia    can wake up during procedure   Diabetes mellitus without complication (HCC)    Dysmenorrhea    Endometriosis    Fibroid    Genital warts    GERD (gastroesophageal reflux disease)    Hypertension    PONV (postoperative nausea and vomiting)    RA (rheumatoid arthritis) (HCC)    Radiculopathy    Family History  Problem Relation Age of Onset   Obesity Mother    Coronary artery disease Mother    Bipolar disorder Mother    Multiple sclerosis Mother    Hypertension Father    Heart attack Father    Vascular Disease Father    Breast cancer Maternal Grandmother 86   Past Surgical History:  Procedure Laterality Date   ABDOMINAL HYSTERECTOMY     CARPOMETACARPEL SUSPENSION PLASTY Left 02/13/2023   Procedure: Left trapeziectomy with ligament reconstruction and tendon interposition;  Surgeon:  Romona Harari, MD;  Location: [facility] OR;  Service: Orthopedics;  Laterality: Left;  regional, he can start earlier if need be to fit   CLOSED REDUCTION FINGER WITH PERCUTANEOUS PINNING Left 02/13/2023   Procedure: possible percutaneous pinning of thumb MCP join;  Surgeon: Romona Harari, MD;  Location: [facility] OR;  Service: Orthopedics;  Laterality: Left;  regional he can start earlier if need be to fit   COLONOSCOPY WITH PROPOFOL  N/A 06/26/2022   Procedure: COLONOSCOPY WITH PROPOFOL ;  Surgeon: Therisa Bi, MD;  Location: [facility] ENDOSCOPY;  Service: Gastroenterology;  Laterality: N/A;  Patient informed the office that she wakes from anesthesia combative and pulliing at tubes.  She wanted us  to be aware.   ESOPHAGOGASTRODUODENOSCOPY (EGD) WITH PROPOFOL  N/A 06/26/2022   Procedure: ESOPHAGOGASTRODUODENOSCOPY (EGD) WITH PROPOFOL ;  Surgeon: Therisa Bi, MD;  Location: [facility] ENDOSCOPY;  Service: Gastroenterology;  Laterality: N/A;   PARATHYROIDECTOMY     Social History   Occupational  History   Not on file  Tobacco Use   Smoking status: Former    Current packs/day: 0.00    Average packs/day: 1 pack/day for 10.0 years (10.0 ttl pk-yrs)    Types: Cigarettes    Start date: 01/15/1995    Quit date: 01/14/2005    Years since quitting: 18.8    Passive exposure: Past   Smokeless tobacco: Never  Vaping Use   Vaping status: Never Used  Substance and Sexual Activity   Alcohol use: Not Currently   Drug use: Yes    Types: Hydrocodone   Sexual activity: Yes    Partners: Male    Birth control/protection: Surgical    Comment: Hysterectomy; First IC @ 13y/o, Partners >5, DES exposure-unknown

## 2023-11-13 NOTE — Progress Notes (Signed)
 Patient says that she got some relief in the right hip about a week after the injection. She was in a flare, so she was prescribed a Prednisone  dosepak, and is unsure if that helped the hip, or if the injection helped the hip. She is having pain, although it is not as bad as it was prior to the injection. She does believe that the weather changes are making her pain worse. She is here today for left hip injection.

## 2023-11-15 DIAGNOSIS — E109 Type 1 diabetes mellitus without complications: Secondary | ICD-10-CM | POA: Diagnosis not present

## 2023-11-17 ENCOUNTER — Encounter: Payer: Self-pay | Admitting: Internal Medicine

## 2023-11-17 ENCOUNTER — Encounter: Payer: Self-pay | Admitting: Radiology

## 2023-11-17 ENCOUNTER — Encounter: Payer: Self-pay | Admitting: Pain Medicine

## 2023-11-24 ENCOUNTER — Encounter: Payer: Self-pay | Admitting: Family Medicine

## 2023-11-24 ENCOUNTER — Other Ambulatory Visit: Payer: Self-pay

## 2023-11-24 DIAGNOSIS — Z79899 Other long term (current) drug therapy: Secondary | ICD-10-CM

## 2023-11-24 DIAGNOSIS — L409 Psoriasis, unspecified: Secondary | ICD-10-CM

## 2023-11-24 MED ORDER — COSENTYX UNOREADY 300 MG/2ML ~~LOC~~ SOAJ: 300.0000 mg | SUBCUTANEOUS | 0 refills | Status: AC

## 2023-11-24 NOTE — Progress Notes (Signed)
   11/24/2023  [name], female   DOB: [birth year], 53 y.o.   MRN: [id number]  Pharmacy Quality Measure Review  This patient is appearing on a report for being at risk of failing the Glycemic Status Assessment in Diabetes measure this calendar year.   A1c gap closed and KED closed for 2025.   Lang Sieve, PharmD, BCGP Clinical Pharmacist  [phone]

## 2023-11-24 NOTE — Telephone Encounter (Signed)
 Patient contacted the office to request a refill of Cosentyx  be sent to [facility].   Last Fill: 09/08/2023  Labs: 09/29/2023 CBC and CMP WNL  TB Gold: 09/29/2023  Negative  Next Visit: 12/30/2023  Last Visit: 09/29/2023  IK:Ednmpjdpd and similar disorder   Current Dose per office note 09/29/2023: Cosentyx  300 mg [place] monthly.   Okay to refill Cosentyx ?

## 2023-11-25 ENCOUNTER — Telehealth: Payer: Self-pay | Admitting: Pharmacist

## 2023-11-25 ENCOUNTER — Other Ambulatory Visit: Payer: Self-pay | Admitting: Family Medicine

## 2023-11-25 NOTE — Telephone Encounter (Signed)
 Submitted Patient Assistance RENEWAL Application to [employer] for COSENTYX  SQ with patient portion, provider portion, insurance card copy, prior authorization approval, current medication list, and income documents. Will update patient when we receive a response.  Phone: [phone] Fax: [phone]  Of note, renewal application must be received by 12/15/2023 which can take up to 10 weeks to review. Patients may be eligible to receive refills in January and February 2026 if needed while they process the application.  Sherry Pennant, PharmD, MPH, BCPS, CPP Clinical Pharmacist [facility])

## 2023-11-26 ENCOUNTER — Other Ambulatory Visit: Payer: Self-pay | Admitting: Family Medicine

## 2023-11-26 DIAGNOSIS — M170 Bilateral primary osteoarthritis of knee: Secondary | ICD-10-CM

## 2023-11-26 DIAGNOSIS — L4059 Other psoriatic arthropathy: Secondary | ICD-10-CM

## 2023-11-26 DIAGNOSIS — M150 Primary generalized (osteo)arthritis: Secondary | ICD-10-CM

## 2023-11-27 NOTE — Telephone Encounter (Signed)
 Received a fax from [employer] regarding an approval for COSENTYX  SQ patient assistance from 11/27/2023 to 01/13/2025. Approval letter sent to scan center.  Phone: [phone] Fax: [phone]   Sherry Pennant, PharmD, MPH, BCPS, CPP Clinical Pharmacist [facility])

## 2023-11-28 ENCOUNTER — Other Ambulatory Visit: Payer: Self-pay | Admitting: Family Medicine

## 2023-11-28 DIAGNOSIS — E669 Obesity, unspecified: Secondary | ICD-10-CM

## 2023-11-28 DIAGNOSIS — I10 Essential (primary) hypertension: Secondary | ICD-10-CM

## 2023-12-04 NOTE — Assessment & Plan Note (Signed)
 Pt has severe osteoarthritis of the knees and hips.  This impairs her ambulation significantly.  Typically ambulates with a walker.  Has difficulty getting up from seated position. Her gait is slowed.   Pt would benefit from home chair lift to help her get up and down from seated position.

## 2023-12-09 ENCOUNTER — Other Ambulatory Visit: Payer: Self-pay | Admitting: Family Medicine

## 2023-12-22 NOTE — Progress Notes (Signed)
 "  Office Visit Note  Patient: Elizabeth Patrick             Date of Birth: [birth year]           MRN: [id number]             PCP: Chandra Toribio POUR, MD Referring: Chandra Toribio POUR, MD Visit Date: 12/30/2023   Subjective:  Medication Management (Cosentyx  and meloxicam  does not work )   Discussed the use of AI scribe software for clinical note transcription with the patient, who gave verbal consent to proceed.  History of Present Illness   Elizabeth Patrick is a 53 y.o. female here for follow up with psoriatic arthritis and possibly seronegative RA now on Cosentyx  300 mg [place] monthly.   She experiences persistent joint pain despite receiving multiple injections in her knees and hips, and a recent radiofrequency ablation (RFA) on her lower back. The RFA has improved her back pain, but the cortisone injections have not alleviated her right hip pain or left knee pain. Her left knee is described as 'too bad now' and not responding to treatment.  She experiences instability in her knee and hip, necessitating the use of a walker to prevent falls. She recounts a significant fall last Thursday, where she flipped over the side of a couch, hitting her head on a bookcase, resulting in a painful goose egg on the back of her head. She continues to feel sore from the fall.  Her skin rashes have cleared up, particularly on her feet, although they tend to come and go. The rashes do not coincide with her joint symptoms. She uses a walker due to instability, which has caused her palms to break open.  She is currently taking Cosentyx  and meloxicam  but reports no significant improvement in her arthritis symptoms. She mentions experiencing brain fog, which she attributes to a high dose of Lyrica. Meloxicam  does not seem to alleviate her pain. She has been on meloxicam  for about six months.  She has a history of using Ozempic  but discontinued it due to gastroparesis. She expresses concern about the impact of her weight  on her arthritis and medication efficacy.       Previous HPI 09/29/2023 Elizabeth Patrick is a 53 y.o. female here for follow up with psoriatic arthritis and possibly seronegative RA now on Cosentyx  300 mg [place] monthly.   She has experienced significant improvement in her psoriasis symptoms since starting Cosentyx . After receiving a couple of shots, she is due for her second monthly dose soon. Her feet, previously covered in pustules, have cleared, and she currently has no lesions on her hands, which typically flare up under stress.   Despite the improvement in psoriasis, there is no significant change in her joint symptoms. She finds it difficult to distinguish between psoriatic arthritis and osteoarthritis symptoms. She experiences persistent pain in the back of her head, neck, spine, and down one shoulder. She has undergone x-rays and an MRI of her neck and receives nerve ablations every six months for lower back pain. She can feel when the effects of the ablation start to wear off as the pain returns.   She had an injection in the left hip 2 months ago with orthopedics with good benefit.   She describes new pain at the tip of her elbow and in one finger, which she attributes to osteoarthritis. She is scheduled for hand surgery but is hesitant to proceed. Her hands are affected by osteoarthritis, causing pain  across her hand and elbow.   In terms of family history, her mother has purple feet, similar to her own condition, which she attributes to venous insufficiency.    Previous HPI 06/25/2023 Elizabeth Patrick is a 53 year old female with psoriatic arthritis possibly seronegative RA on actemra  infusions who presents with joint pain and skin issues.   She has experienced significant joint pain and skin issues, with a history of psoriasis. Her symptoms worsened after contracting COVID-19 last month, which lasted two and a half weeks and resulted in lingering exhaustion and joint aches, but no  significant respiratory symptoms. During this time, her psoriasis flared, with peeling skin and blisters on her hands and feet.   She has been on Actemra  infusions for seven years for joint issues, but reports progressive deterioration in her condition. Initially, she was able to work full-time and perform daily activities, but her condition has worsened to the point where she now requires a cane for mobility. She has also been on prednisone  for two and a half years. Recently, she started Ozempic  to aid with weight loss, as her weight has been a contributing factor to her joint issues.   She experiences significant pain in her knees, hips, and elbows, with her left leg swelling and causing frequent falls. She has been using HCTZ to manage the swelling, but it has not been effective. She also reports neuropathy, which makes wearing compression garments difficult. She has a history of yeast infections and rashes, particularly in areas where skin touches skin, and has been managing these with topical treatments.   Her medication history includes methotrexate, which she discontinued due to liver issues and mouth sores. She has been on hydroxychloroquine  for a year and a half, but is uncertain of its effectiveness in reducing her pain. She wants to reduce her medication burden if possible.        Previous HPI 04/08/23 Elizabeth Patrick is a 53 year old female with osteoarthritis, seronegative rheumatoid arthritis, psoriasis, and chronic neuropathic pain who presents for transferring care of her RA.   She has a history of seronegative rheumatoid arthritis diagnosed ten years ago based on polyarticular small joint inflammation and highly abnormal Vectra labwork. She is currently receiving Actemra  infusions monthly, with her next dose scheduled for April 8th. She has tried multiple medications in the past, including Enbrel, Humira, Xeljanz, methotrexate, and leflunomide, with varying degrees of success and side  effects. Actemra  is 'okay' but causes GI symptoms after each infusion. She has not tried subcutaneous Actemra . She is tired all the time, experiences pain in small joints like her hands and toes, and has headaches and shoulder pain. No frequent infections while on Actemra , except for a recent UTI. She experiences swelling in her legs and varicose veins.   She underwent surgery on her thumb at the end of January for osteoarthritis. Post-surgery, the neuropathy in her hand has resolved, although she continues to experience significant pain in her hands. She has been using Plaquenil  for the past eight to nine months but is unsure of its efficacy. She experiences painful hands and has undergone cortisone injections every three months and ablation for her back pain due to degenerative changes.   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 She has received spinal injections, which she now realizes were effective as her symptoms have returned. She experiences neuropathy, particularly at night, and takes a  high dose of Lyrica to manage it.   She has psoriasis on her palms and soles, which flares up with stress or when her body is 'overtaxed'. She is currently experiencing a breakout on her feet. She uses clobetasol  for her psoriasis and nystatin  for intertrigo, which she is managing due to weight loss-related skin changes.   She has a history of diabetes, diagnosed with a hemoglobin A1c of 6.1, but reports no significant hyperglycemia or complications such as eye or kidney problems. She is on Ozempic  for diabetes management. She also reports a history of mild carpal tunnel syndrome diagnosed before her RA diagnosis.   She has moved to a house without stairs to accommodate her mobility issues.    DMARD Hx Methotrexate - PO GI Sx, [place] injection site reactions Leflunomide - Nonresponder Humira - Nonresponder (?) Enbrel  - Nonresponder (?) Xeljanz - Nonresponder Actemra  - Current treatment Hydroxychloroquine  - Current treatment   Labs reviewed 02/2023 CBC wnl CMP wnl Lipids TGs high   10/2022 HAV/HBV/HCV neg TB neg     Review of Systems  Constitutional:  Positive for fatigue.  HENT:  Positive for mouth dryness. Negative for mouth sores.   Eyes:  Positive for dryness.  Respiratory:  Negative for shortness of breath.   Cardiovascular:  Negative for chest pain and palpitations.  Gastrointestinal:  Negative for blood in stool, constipation and diarrhea.  Endocrine: Positive for increased urination.  Genitourinary:  Positive for involuntary urination.  Musculoskeletal:  Positive for joint pain, gait problem, joint pain, joint swelling, myalgias, muscle weakness, morning stiffness, muscle tenderness and myalgias.  Skin:  Positive for sensitivity to sunlight. Negative for color change, rash and hair loss.  Allergic/Immunologic: Positive for susceptible to infections.  Neurological:  Positive for dizziness and headaches.  Hematological:  Negative for swollen glands.  Psychiatric/Behavioral:  Positive for sleep disturbance. Negative for depressed mood. The patient is not nervous/anxious.     PMFS History:  Patient Active Problem List   Diagnosis Date Noted   Chronic lower extremity pain (1ry area of Pain) (Bilateral) 11/03/2023   Chronic groin pain (4th area of Pain) (Right) 11/03/2023   History of psoriatic arthritis 11/02/2023   Polyarticular psoriatic arthritis (HCC) 11/02/2023   Psoriatic arthritis (HCC) 09/29/2023   Healthcare maintenance 09/29/2023   Chronic low back pain (7th area of Pain) (Bilateral) w/o sciatica 09/24/2023   Chronic knee pain (2ry area of Pain) (Bilateral) 09/24/2023   Cervicalgia 09/24/2023   Cervicogenic headache (6th area of Pain) (Right) 09/24/2023   Osteoarthritis involving multiple joints 09/24/2023   Chronic pain syndrome 09/23/2023   Pharmacologic therapy  09/23/2023   Disorder of skeletal system 09/23/2023   Problems influencing health status 09/23/2023   Cervical radiculopathy 09/23/2023   Long term (current) use of opiate analgesic 09/23/2023   Chronic neck pain (5th area of Pain) (Bilateral) 09/23/2023   Osteoarthritis of knees (Bilateral) 09/23/2023   Wrist pain, right 09/23/2023   Lumbosacral spondylosis without myelopathy 09/23/2023   Polyneuropathy due to type 2 diabetes mellitus (HCC) 09/23/2023   Encounter for long-term opiate analgesic use 09/23/2023   Abnormal MRI, cervical spine (06/18/2022) 09/23/2023   Dermatitis due to sun 06/23/2023   High risk medication use 04/08/2023   Vitamin D  deficiency 04/08/2023   Screening for tuberculosis 04/08/2023   Intertrigo 12/16/2022   Chronic idiopathic constipation 09/06/2022   Gastroparesis 06/26/2022   Chronic hip pain (3ry area of Pain) (Right) 12/15/2021   DDD (degenerative disc disease), lumbosacral 12/15/2021   Recurrent cold  sores 12/15/2021   Vitamin B12 deficiency 07/23/2021   Severe sleep apnea 07/04/2021   Psoriasis and similar disorder 05/27/2021   Depression, major, single episode, moderate (HCC) 04/29/2021   Loud snoring 04/17/2021   Menopause 04/16/2021   Morbid obesity (HCC) 04/05/2021   Type 2 diabetes mellitus without complication, with long-term current use of insulin  (HCC) 11/28/2020   Gastroesophageal reflux disease 11/28/2020   Recurrent UTI 10/17/2020   Hyperlipidemia associated with type 2 diabetes mellitus (HCC) 10/11/2020   Lower extremity edema (Bilateral) 10/11/2020   Diabetic polyneuropathy associated with type 2 diabetes mellitus (HCC) 10/11/2020   NASH (nonalcoholic steatohepatitis) 10/10/2020   Hypertension associated with diabetes (HCC) 09/26/2020    Past Medical History:  Diagnosis Date   Abnormal uterine bleeding    Anemia    Blood transfusion without reported diagnosis    Complication of anesthesia    can wake up during procedure    Diabetes mellitus without complication (HCC)    Dysmenorrhea    Endometriosis    Fibroid    Genital warts    GERD (gastroesophageal reflux disease)    Hypertension    PONV (postoperative nausea and vomiting)    RA (rheumatoid arthritis) (HCC)    Radiculopathy     Family History  Problem Relation Age of Onset   Obesity Mother    Coronary artery disease Mother    Bipolar disorder Mother    Multiple sclerosis Mother    Hypertension Father    Heart attack Father    Vascular Disease Father    Breast cancer Maternal Grandmother 41   Past Surgical History:  Procedure Laterality Date   ABDOMINAL HYSTERECTOMY     CARPOMETACARPEL SUSPENSION PLASTY Left 02/13/2023   Procedure: Left trapeziectomy with ligament reconstruction and tendon interposition;  Surgeon: Romona Harari, MD;  Location: [facility] OR;  Service: Orthopedics;  Laterality: Left;  regional, he can start earlier if need be to fit   CLOSED REDUCTION FINGER WITH PERCUTANEOUS PINNING Left 02/13/2023   Procedure: possible percutaneous pinning of thumb MCP join;  Surgeon: Romona Harari, MD;  Location: [facility] OR;  Service: Orthopedics;  Laterality: Left;  regional he can start earlier if need be to fit   COLONOSCOPY WITH PROPOFOL  N/A 06/26/2022   Procedure: COLONOSCOPY WITH PROPOFOL ;  Surgeon: Therisa Bi, MD;  Location[facility]COPY;  Service: Gastroenterology;  Laterality: N/A;  Patient informed the office that she wakes from anesthesia combative and pulliing at tubes.  She wanted us  to be aware.   ESOPHAGOGASTRODUODENOSCOPY (EGD) WITH PROPOFOL  N/A 06/26/2022   Procedure: ESOPHAGOGASTRODUODENOSCOPY (EGD) WITH PROPOFOL ;  Surgeon: Therisa Bi, MD;  Location: [facility] ENDOSCOPY;  Service: Gastroenterology;  Laterality: N/A;   PARATHYROIDECTOMY     Social History   Social History Narrative   Not on file   Immunization History  Administered Date(s) Administered   Hep A / Hep B 08/13/2021   Influenza, Seasonal, Injecte, Preservative Fre  10/15/2022, 09/23/2023   Influenza,inj,Quad PF,6+ Mos 11/01/2014, 11/23/2020, 10/11/2021   Moderna Sars-Covid-2 Vaccination 05/31/2019, 06/28/2019, 01/14/2020   Pneumococcal Conjugate-13 04/28/2014     Objective: Vital Signs: BP (!) 103/53   Pulse (!) 57   Temp (!) 96.6 F (35.9 C)   Resp 18   Ht 5' 4 (1.626 m)   Wt (!) 342 lb (155.1 kg) Comment: patient refused in office, provided by patient  BMI 58.70 kg/m    Physical Exam Constitutional:      Appearance: She is obese.  Eyes:     Conjunctiva/sclera: Conjunctivae normal.  Cardiovascular:     Rate and Rhythm: Normal rate and regular rhythm.  Pulmonary:     Effort: Pulmonary effort is normal.     Breath sounds: Normal breath sounds.  Lymphadenopathy:     Cervical: No cervical adenopathy.  Skin:    General: Skin is warm and dry.     Comments: Bilateral superficial venous varicosities Very dry, scaly skin on palms, no overlying plaques No digital or nail pitting  Neurological:     Mental Status: She is alert.  Psychiatric:        Mood and Affect: Mood normal.      Musculoskeletal Exam:  Elbows full ROM no tenderness or swelling Right wrist painful with full extension, right 3rd MCP and PIP painful, DIP heberdon's nodes not as painful, right 1st CMC squaring with mild MCP subluxation, reducible with a lot of pain Tenderness throughout multiple areas on upper and lower back Knees tenderness to pressure on anterior and medial joint line, no palpable effusions Ankles full ROM no tenderness or swelling    Investigation: No additional findings.  Imaging: No results found.  Recent Labs: Lab Results  Component Value Date   WBC 6.7 09/29/2023   HGB 13.3 09/29/2023   PLT 235 09/29/2023   NA 142 09/29/2023   K 4.0 09/29/2023   CL 104 09/29/2023   CO2 30 09/29/2023   GLUCOSE 97 09/29/2023   BUN 16 09/29/2023   CREATININE 0.65 09/29/2023   BILITOT 0.5 09/29/2023   ALKPHOS 37 (L) 10/15/2022   AST 15 09/29/2023    ALT 15 09/29/2023   PROT 6.9 09/29/2023   ALBUMIN 4.5 10/15/2022   CALCIUM  9.9 09/29/2023   QFTBGOLDPLUS NEGATIVE 09/29/2023    Speciality Comments: PLQ eye exam: 05/16/2023 No Toxicity @[facility]  Procedures:  No procedures performed Allergies: Alcohol, Dilantin [phenytoin], and Methotrexate   Assessment / Plan:     Visit Diagnoses: Psoriasis and similar disorder  Psoriatic arthritis (HCC) - Plan: diclofenac  (VOLTAREN ) 75 MG EC tablet Severe osteoarthritis with significant structural damage and instability. Minimal inflammatory component. Current treatment ineffective. Weight loss and joint replacement considered definitive interventions. - Prescribed diclofenac  75 mg enteric coated, twice daily. - Discontinued Cosentyx , monitor for flare-ups. - Advised to store Cosentyx  for future use if symptoms worsen. - Encouraged weight loss as a long-term strategy.  High risk medication use - Continue Cosentyx  300 mg  monthly. Psoriasis No significant improvement in arthritis symptoms. Increased infection frequency noted. Overall skin symptom improvement not high enough severity to be indication for maintaining on cosentyx  long term especially with infections increased rate. - Discontinued Cosentyx , monitor skin symptoms. - Advised to store Cosentyx  for future use if skin symptoms worsen.  Osteoarthritis of knees (Bilateral) - Plan: diclofenac  (VOLTAREN ) 75 MG EC tablet Osteoarthritis involving multiple joints - Plan: diclofenac  (VOLTAREN ) 75 MG EC tablet    Orders: No orders of the defined types were placed in this encounter.  Meds ordered this encounter  Medications   diclofenac  (VOLTAREN ) 75 MG EC tablet    Sig: Take 1 tablet (75 mg total) by mouth 2 (two) times daily.    Dispense:  60 tablet    Refill:  2     Follow-Up Instructions: Return in about 3 months (around 03/29/2024) for PsA/OA NSAIDs change f/u 3mos.   Lonni LELON Ester, MD  Note - This record  has been created using [employer].  Chart creation errors have been sought, but may not always  have been located. Such  creation errors do not reflect on  the standard of medical care. "

## 2023-12-24 ENCOUNTER — Ambulatory Visit: Admitting: Family Medicine

## 2023-12-24 ENCOUNTER — Other Ambulatory Visit: Payer: Self-pay | Admitting: Family Medicine

## 2023-12-30 ENCOUNTER — Encounter: Payer: Self-pay | Admitting: Internal Medicine

## 2023-12-30 ENCOUNTER — Encounter: Payer: Self-pay | Admitting: Family Medicine

## 2023-12-30 ENCOUNTER — Ambulatory Visit: Attending: Internal Medicine | Admitting: Internal Medicine

## 2023-12-30 ENCOUNTER — Other Ambulatory Visit: Payer: Self-pay | Admitting: Family Medicine

## 2023-12-30 VITALS — BP 103/53 | HR 57 | Temp 96.6°F | Resp 18 | Ht 64.0 in | Wt 342.0 lb

## 2023-12-30 DIAGNOSIS — L409 Psoriasis, unspecified: Secondary | ICD-10-CM | POA: Diagnosis not present

## 2023-12-30 DIAGNOSIS — M170 Bilateral primary osteoarthritis of knee: Secondary | ICD-10-CM | POA: Diagnosis not present

## 2023-12-30 DIAGNOSIS — M150 Primary generalized (osteo)arthritis: Secondary | ICD-10-CM

## 2023-12-30 DIAGNOSIS — L4050 Arthropathic psoriasis, unspecified: Secondary | ICD-10-CM

## 2023-12-30 DIAGNOSIS — Z79899 Other long term (current) drug therapy: Secondary | ICD-10-CM

## 2023-12-30 MED ORDER — NITROFURANTOIN MONOHYD MACRO 100 MG PO CAPS: 100.0000 mg | ORAL_CAPSULE | Freq: Two times a day (BID) | ORAL | 0 refills | Status: AC

## 2023-12-30 MED ORDER — DICLOFENAC SODIUM 75 MG PO TBEC: 75.0000 mg | DELAYED_RELEASE_TABLET | Freq: Two times a day (BID) | ORAL | 2 refills | Status: AC

## 2024-01-14 ENCOUNTER — Ambulatory Visit: Admitting: Family Medicine

## 2024-01-14 VITALS — BP 90/48 | HR 56 | Ht 64.0 in | Wt 355.4 lb

## 2024-01-14 DIAGNOSIS — L4050 Arthropathic psoriasis, unspecified: Secondary | ICD-10-CM | POA: Diagnosis not present

## 2024-01-14 DIAGNOSIS — Z794 Long term (current) use of insulin: Secondary | ICD-10-CM | POA: Diagnosis not present

## 2024-01-14 DIAGNOSIS — M170 Bilateral primary osteoarthritis of knee: Secondary | ICD-10-CM

## 2024-01-14 DIAGNOSIS — E1159 Type 2 diabetes mellitus with other circulatory complications: Secondary | ICD-10-CM

## 2024-01-14 DIAGNOSIS — I152 Hypertension secondary to endocrine disorders: Secondary | ICD-10-CM

## 2024-01-14 DIAGNOSIS — E119 Type 2 diabetes mellitus without complications: Secondary | ICD-10-CM

## 2024-01-14 DIAGNOSIS — R600 Localized edema: Secondary | ICD-10-CM | POA: Diagnosis not present

## 2024-01-14 LAB — POCT GLYCOSYLATED HEMOGLOBIN (HGB A1C): HbA1c POC (<> result, manual entry): 5.7 %

## 2024-01-14 MED ORDER — TIRZEPATIDE 2.5 MG/0.5ML ~~LOC~~ SOAJ: 2.5000 mg | SUBCUTANEOUS | 0 refills | Status: AC

## 2024-01-14 NOTE — Progress Notes (Unsigned)
 "  Established Patient Office Visit  Subjective   [name], female    DOB: 1970/02/25  Age: 53 y.o. MRN: [id number]  Chief Complaint  Patient presents with   Medical Management of Chronic Issues     History of Present Illness   Elizabeth Patrick is a 53 year old female with obesity and osteoarthritis who presents with challenges in pursuing bariatric surgery options.  She has been exploring bariatric surgery options, specifically the lap band, due to concerns about medication absorption with other types of bariatric surgeries. She has contacted multiple medical facilities, including [facility] and [facility], but has been unable to find a provider who performs the lap band procedure. She is hesitant to undergo more invasive surgeries like gastric bypass due to potential complications and the impact on medication absorption.  She has a history of psoriatic arthritis and osteoarthritis. Her osteoarthritis has worsened, particularly affecting her hip, which causes significant pain and limits her mobility. This has impacted her ability to exercise and perform daily activities, contributing to her weight management challenges.  She previously tried Ozempic  for weight loss but discontinued it due to side effects, including gastrointestinal discomfort and difficulty with dairy absorption. She experienced initial success with a 75-pound weight loss, but the medication's effectiveness plateaued, and she experienced side effects that led her to stop the medication.  Her family history includes obesity, with her father weighing over 500 pounds at the time of his death. Her daughter has successfully lost 90 pounds over the past two years. She describes a family tendency towards obesity and a challenging metabolism.  She experiences significant swelling in her left leg, which has been evaluated with prior ultrasounds showing minimal venous insufficiency and normal arterial function. The  swelling is inconsistent and not related to salt intake or activity level, and it has not responded to diuretics like Lasix . She uses support hose to manage the swelling.  She feels exhausted with minimal activity and has difficulty managing household tasks due to her physical limitations.        The 10-year ASCVD risk score (Arnett DK, et al., 2019) is: 1.4%  Health Maintenance Due  Topic Date Due   DTaP/Tdap/Td (1 - Tdap) Never done   Zoster Vaccines- Shingrix (1 of 2) Never done   Pneumococcal Vaccine: 50+ Years (2 of 2 - PPSV23, PCV20, or PCV21) 06/23/2014   Hepatitis B Vaccines 19-59 Average Risk (2 of 3 - 19+ 3-dose series) 09/10/2021   COVID-19 Vaccine (4 - 2025-26 season) 09/15/2023      Objective:     BP (!) 90/48   Pulse (!) 56   Ht 5' 4 (1.626 m)   Wt (!) 355 lb 6.4 oz (161.2 kg)   BMI 61.00 kg/m    Physical Exam     Gen: alert, oriented. Obese.  Accompanied by husband.  Pulm: no respiratory distress Ext: non pitting LE edema Psych: pleasant affect       Results for orders placed or performed in visit on 01/14/24  POCT glycosylated hemoglobin (Hb A1C)  Result Value Ref Range   Hemoglobin A1C     HbA1c POC (<> result, manual entry) 5.7 4.0 - 5.6 %   HbA1c, POC (prediabetic range)     HbA1c, POC (controlled diabetic range)          Assessment & Plan:   Type 2 diabetes mellitus without complication, with long-term current use of insulin  [facility]) Assessment & Plan: Well-controlled with an A1c  of 5.7%. Mounjaro  is considered as an alternative to manage weight and glycemic control. - Continue monitoring A1c levels.  Orders: -     POCT glycosylated hemoglobin (Hb A1C)  Hypertension associated with diabetes (HCC) -     POCT glycosylated hemoglobin (Hb A1C)  Morbid obesity (HCC) Assessment & Plan: Management is challenging due to insurance limitations and personal preferences. She prefers lap band surgery but faces availability issues and concerns  about medication absorption post-surgery. Mounjaro  (Zepbound ) is considered as an alternative to Ozempic  due to better tolerance and similar efficacy. Gastric balloon Noel) is another potential option, though availability is uncertain. - Started Mounjaro  (Zepbound ) at 2.5 mg, will increase to 5 mg after one month based on tolerance and effectiveness. - Investigate availability of gastric balloon Noel) at local clinics, including [facility] and [facility].   Osteoarthritis of knees (Bilateral) Assessment & Plan: Worsening with grade four arthritis in the hip causing significant pain and functional limitations. - Encouraged participation in [employer] program for exercise and mobility.   Psoriatic arthritis (HCC) Assessment & Plan: Chronic condition with ongoing management challenges. Concerns about medication absorption post-bariatric surgery influence preference for less invasive weight loss options.   Lower extremity edema (Bilateral) Assessment & Plan: Contributing to leg swelling. Previous evaluations showed minimal venous insufficiency and normal arterial function. - Continue use of compression wraps and support hose for leg swelling.    Low ceruloplasmin level (HCC) Assessment & Plan: Has been worked up in the past.  Urinary copper  levels wnl. No additional workup at this time.    Other orders -     Tirzepatide ; Inject 2.5 mg into the skin once a week.  Dispense: 2 mL; Refill: 0      Return in about 4 months (around 05/10/2024) for DM.    Elizabeth MARLA Slain, MD  "

## 2024-01-14 NOTE — Patient Instructions (Addendum)
" °  VISIT SUMMARY: Today, we discussed your challenges with weight management and explored various options for bariatric surgery. We also addressed your osteoarthritis, psoriatic arthritis, and leg swelling. A new medication, Mounjaro, was started to help with weight loss and diabetes management.  YOUR PLAN: OBESITY: You are facing challenges with weight management and have a preference for lap band surgery, but availability is an issue. -Start Mounjaro (Zepbound) at 2.5 mg, and increase to 5 mg after one month based on tolerance and effectiveness. -Investigate availability of gastric balloon Noel) at local clinics, including [facility], [facility] or [facility]   TYPE 2 DIABETES MELLITUS: Your diabetes is well-controlled with an A1c of 5.7%. -Continue monitoring A1c levels.  OSTEOARTHRITIS OF HIP AND KNEE: Your osteoarthritis is worsening, especially in your hip, causing significant pain and limiting your mobility. -Participate in the [employer] program for exercise and mobility.  PSORIATIC ARTHRITIS: You have ongoing management challenges with psoriatic arthritis, and concerns about medication absorption post-bariatric surgery. -Continue current management and consider less invasive weight loss options.  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 -Continue using compression wraps and support hose for leg swelling.    "

## 2024-01-17 NOTE — Assessment & Plan Note (Signed)
 Has been worked up in the past.  Urinary copper  levels wnl. No additional workup at this time.

## 2024-01-17 NOTE — Assessment & Plan Note (Signed)
 Worsening with grade four arthritis in the hip causing significant pain and functional limitations. - Encouraged participation in [employer] program for exercise and mobility.

## 2024-01-17 NOTE — Assessment & Plan Note (Signed)
 Well-controlled with an A1c of 5.7%. Mounjaro  is considered as an alternative to manage weight and glycemic control. - Continue monitoring A1c levels.

## 2024-01-17 NOTE — Assessment & Plan Note (Signed)
 Chronic condition with ongoing management challenges. Concerns about medication absorption post-bariatric surgery influence preference for less invasive weight loss options.

## 2024-01-17 NOTE — Assessment & Plan Note (Signed)
 Contributing to leg swelling. Previous evaluations showed minimal venous insufficiency and normal arterial function. - Continue use of compression wraps and support hose for leg swelling.

## 2024-01-17 NOTE — Assessment & Plan Note (Signed)
 Management is challenging due to insurance limitations and personal preferences. She prefers lap band surgery but faces availability issues and concerns about medication absorption post-surgery. Mounjaro  (Zepbound ) is considered as an alternative to Ozempic  due to better tolerance and similar efficacy. Gastric balloon Noel) is another potential option, though availability is uncertain. - Started Mounjaro  (Zepbound ) at 2.5 mg, will increase to 5 mg after one month based on tolerance and effectiveness. - Investigate availability of gastric balloon Noel) at local clinics, including [facility] and [facility].

## 2024-01-19 ENCOUNTER — Ambulatory Visit: Admitting: Physician Assistant

## 2024-01-19 ENCOUNTER — Telehealth: Payer: Self-pay

## 2024-01-19 ENCOUNTER — Encounter: Payer: Self-pay | Admitting: Obstetrics and Gynecology

## 2024-01-19 NOTE — Telephone Encounter (Signed)
 Copied from CRM [id number]. Topic: Clinical - Medication Prior Auth >> Jan 19, 2024  3:53 PM Antony RAMAN wrote: Reason for CRM: emily from [employer] needing clinical information for a prior auth for mounjaro  . [phone] option 5

## 2024-01-20 ENCOUNTER — Other Ambulatory Visit (HOSPITAL_COMMUNITY): Payer: Self-pay

## 2024-01-20 NOTE — Telephone Encounter (Signed)
 A user error has taken place: encounter opened in error, closed for administrative reasons.

## 2024-01-22 ENCOUNTER — Telehealth: Payer: Self-pay | Admitting: *Deleted

## 2024-01-22 ENCOUNTER — Other Ambulatory Visit (HOSPITAL_COMMUNITY): Payer: Self-pay

## 2024-01-22 ENCOUNTER — Encounter: Payer: Self-pay | Admitting: Family Medicine

## 2024-01-22 NOTE — Telephone Encounter (Signed)
 Contacted pt to inform her that we received the denial letter and that Dr. Chandra would work on it and we would get it sent in as soon as he completes it.

## 2024-01-22 NOTE — Telephone Encounter (Signed)
 Copied from CRM #[id number]. Topic: Clinical - Medication Prior Auth >> Jan 22, 2024  8:16 AM Amy B wrote: Reason for CRM: Rep with [employer] called stating that the prescription for Mounjaro  was denied.  They did not receive clinicals.  Letter will be sent to member and faxed to clinic as well.

## 2024-01-24 ENCOUNTER — Encounter: Payer: Self-pay | Admitting: Family Medicine

## 2024-01-26 ENCOUNTER — Other Ambulatory Visit (HOSPITAL_COMMUNITY): Payer: Self-pay

## 2024-01-26 ENCOUNTER — Other Ambulatory Visit: Payer: Self-pay | Admitting: Family Medicine

## 2024-01-26 DIAGNOSIS — I10 Essential (primary) hypertension: Secondary | ICD-10-CM

## 2024-01-26 MED ORDER — METOPROLOL SUCCINATE ER 25 MG PO TB24: 12.5000 mg | ORAL_TABLET | Freq: Every day | ORAL | 3 refills | Status: AC

## 2024-02-05 ENCOUNTER — Ambulatory Visit: Admitting: Orthopaedic Surgery

## 2024-02-05 VITALS — Ht 64.5 in | Wt 351.0 lb

## 2024-02-05 DIAGNOSIS — M1712 Unilateral primary osteoarthritis, left knee: Secondary | ICD-10-CM

## 2024-02-05 DIAGNOSIS — M170 Bilateral primary osteoarthritis of knee: Secondary | ICD-10-CM | POA: Diagnosis not present

## 2024-02-05 DIAGNOSIS — M1711 Unilateral primary osteoarthritis, right knee: Secondary | ICD-10-CM

## 2024-02-05 MED ORDER — BUPIVACAINE HCL 0.5 % IJ SOLN
2.0000 mL | INTRAMUSCULAR | Status: AC | PRN
  Administered 2024-02-05: 2 mL via INTRA_ARTICULAR

## 2024-02-05 MED ORDER — METHYLPREDNISOLONE ACETATE 40 MG/ML IJ SUSP
40.0000 mg | INTRAMUSCULAR | Status: AC | PRN
  Administered 2024-02-05: 40 mg via INTRA_ARTICULAR

## 2024-02-05 MED ORDER — LIDOCAINE HCL 1 % IJ SOLN
2.0000 mL | INTRAMUSCULAR | Status: AC | PRN
  Administered 2024-02-05: 2 mL

## 2024-02-05 NOTE — Progress Notes (Signed)
 "  Office Visit Note   Patient: Elizabeth Patrick           Date of Birth: Jan 25, 1970           MRN: [id number] Visit Date: 02/05/2024              Requested by: Chandra Toribio POUR, MD [address] [place],  [place] [address] PCP: Chandra Toribio POUR, MD   Assessment & Plan: Visit Diagnoses:  1. Primary osteoarthritis of right knee   2. Primary osteoarthritis of left knee     Plan: Impression is severe end-stage tricompartmental DJD.  Cortisone injections repeated today.  We will see her back as needed.  Follow-Up Instructions: Return if symptoms worsen or fail to improve.   Orders:  No orders of the defined types were placed in this encounter.  No orders of the defined types were placed in this encounter.     Procedures: Large Joint Inj: bilateral knee on 02/05/2024 3:44 PM Indications: pain Details: 22 G needle  Arthrogram: No  Medications (Right): 2 mL lidocaine  1 %; 2 mL bupivacaine  0.5 %; 40 mg methylPREDNISolone  acetate 40 MG/ML Medications (Left): 2 mL lidocaine  1 %; 2 mL bupivacaine  0.5 %; 40 mg methylPREDNISolone  acetate 40 MG/ML Outcome: tolerated well, no immediate complications Patient was prepped and draped in the usual sterile fashion.       Clinical Data: No additional findings.   Subjective: Chief Complaint  Patient presents with   Right Knee - Pain   Left Knee - Pain    HPI Elizabeth Patrick is a 54 year old female here for evaluation of bilateral knee pain.  She has had severe chronic knee pain for years.  She has been getting steroid injections in her knees every 3 months by her pain doctor but he is no longer at the office. Review of Systems  Constitutional: Negative.   HENT: Negative.    Eyes: Negative.   Respiratory: Negative.    Cardiovascular: Negative.   Endocrine: Negative.   Musculoskeletal: Negative.   Neurological: Negative.   Hematological: Negative.   Psychiatric/Behavioral: Negative.    All other systems reviewed and are  negative.    Objective: Vital Signs: Ht 5' 4.5 (1.638 m)   Wt (!) 351 lb (159.2 kg)   BMI 59.32 kg/m   Physical Exam Vitals and nursing note reviewed.  Constitutional:      Appearance: She is well-developed.  HENT:     Head: Atraumatic.     Nose: Nose normal.  Eyes:     Extraocular Movements: Extraocular movements intact.  Cardiovascular:     Pulses: Normal pulses.  Pulmonary:     Effort: Pulmonary effort is normal.  Abdominal:     Palpations: Abdomen is soft.  Musculoskeletal:     Cervical back: Neck supple.  Skin:    General: Skin is warm.     Capillary Refill: Capillary refill takes less than 2 seconds.  Neurological:     Mental Status: She is alert. Mental status is at baseline.  Psychiatric:        Behavior: Behavior normal.        Thought Content: Thought content normal.        Judgment: Judgment normal.     Ortho Exam Exam of bilateral knees showed no joint effusion.  Medial joint line tenderness. Specialty Comments:  No specialty comments available.  Imaging: No results found.   PMFS History: Patient Active Problem List   Diagnosis Date Noted   Low ceruloplasmin  level (HCC) 01/17/2024   Chronic lower extremity pain (1ry area of Pain) (Bilateral) 11/03/2023   Chronic groin pain (4th area of Pain) (Right) 11/03/2023   History of psoriatic arthritis 11/02/2023   Polyarticular psoriatic arthritis (HCC) 11/02/2023   Psoriatic arthritis (HCC) 09/29/2023   Healthcare maintenance 09/29/2023   Chronic low back pain (7th area of Pain) (Bilateral) w/o sciatica 09/24/2023   Chronic knee pain (2ry area of Pain) (Bilateral) 09/24/2023   Cervicalgia 09/24/2023   Cervicogenic headache (6th area of Pain) (Right) 09/24/2023   Osteoarthritis involving multiple joints 09/24/2023   Chronic pain syndrome 09/23/2023   Pharmacologic therapy 09/23/2023   Disorder of skeletal system 09/23/2023   Problems influencing health status 09/23/2023   Cervical radiculopathy  09/23/2023   Long term (current) use of opiate analgesic 09/23/2023   Chronic neck pain (5th area of Pain) (Bilateral) 09/23/2023   Osteoarthritis of knees (Bilateral) 09/23/2023   Wrist pain, right 09/23/2023   Lumbosacral spondylosis without myelopathy 09/23/2023   Polyneuropathy due to type 2 diabetes mellitus (HCC) 09/23/2023   Encounter for long-term opiate analgesic use 09/23/2023   Abnormal MRI, cervical spine (06/18/2022) 09/23/2023   Dermatitis due to sun 06/23/2023   High risk medication use 04/08/2023   Vitamin D  deficiency 04/08/2023   Screening for tuberculosis 04/08/2023   Intertrigo 12/16/2022   Chronic idiopathic constipation 09/06/2022   Gastroparesis 06/26/2022   Chronic hip pain (3ry area of Pain) (Right) 12/15/2021   DDD (degenerative disc disease), lumbosacral 12/15/2021   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/2021   Vitamin B12 deficiency 07/23/2021   Severe sleep apnea 07/04/2021   Psoriasis and similar disorder 05/27/2021   Loud snoring 04/17/2021   Menopause 04/16/2021   Morbid obesity (HCC) 04/05/2021   Type 2 diabetes mellitus without complication, with long-term current use of insulin  (HCC) 11/28/2020   Gastroesophageal reflux disease 11/28/2020   Recurrent UTI 10/17/2020   Hyperlipidemia associated with type 2 diabetes mellitus (HCC) 10/11/2020   Lower extremity edema (Bilateral) 10/11/2020   Diabetic polyneuropathy associated with type 2 diabetes mellitus (HCC) 10/11/2020   NASH (nonalcoholic steatohepatitis) 10/10/2020   Hypertension associated with diabetes (HCC) 09/26/2020   Past Medical History:  Diagnosis Date   Abnormal uterine bleeding    Anemia    Blood transfusion without reported diagnosis    Complication of anesthesia    can wake up during procedure   Diabetes mellitus without complication (HCC)    Dysmenorrhea    Endometriosis    Fibroid    Genital warts    GERD (gastroesophageal reflux disease)    Hypertension    PONV (postoperative  nausea and vomiting)    RA (rheumatoid arthritis) (HCC)    Radiculopathy     Family History  Problem Relation Age of Onset   Obesity Mother    Coronary artery disease Mother    Bipolar disorder Mother    Multiple sclerosis Mother    Hypertension Father    Heart attack Father    Vascular Disease Father    Breast cancer Maternal Grandmother 93    Past Surgical History:  Procedure Laterality Date   ABDOMINAL HYSTERECTOMY     supracervical hysterectomy   CARPOMETACARPEL SUSPENSION PLASTY Left 02/13/2023   Procedure: Left trapeziectomy with ligament reconstruction and tendon interposition;  Surgeon: Romona Harari, MD;  Location: [facility] OR;  Service: Orthopedics;  Laterality: Left;  regional, he can start earlier if need be to fit   CLOSED REDUCTION FINGER WITH PERCUTANEOUS PINNING Left 02/13/2023   Procedure: possible percutaneous  pinning of thumb MCP join;  Surgeon: Romona Harari, MD;  Location: [facility] OR;  Service: Orthopedics;  Laterality: Left;  regional he can start earlier if need be to fit   COLONOSCOPY WITH PROPOFOL  N/A 06/26/2022   Procedure: COLONOSCOPY WITH PROPOFOL ;  Surgeon: Therisa Bi, MD;  Location: [facility] ENDOSCOPY;  Service: Gastroenterology;  Laterality: N/A;  Patient informed the office that she wakes from anesthesia combative and pulliing at tubes.  She wanted us  to be aware.   ESOPHAGOGASTRODUODENOSCOPY (EGD) WITH PROPOFOL  N/A 06/26/2022   Procedure: ESOPHAGOGASTRODUODENOSCOPY (EGD) WITH PROPOFOL ;  Surgeon: Therisa Bi, MD;  Location: [facility] ENDOSCOPY;  Service: Gastroenterology;  Laterality: N/A;   PARATHYROIDECTOMY     Social History   Occupational History   Not on file  Tobacco Use   Smoking status: Former    Current packs/day: 0.00    Average packs/day: 1 pack/day for 10.0 years (10.0 ttl pk-yrs)    Types: Cigarettes    Start date: 01/15/1995    Quit date: 01/14/2005    Years since quitting: 19.0    Passive exposure: Past   Smokeless tobacco: Never  Vaping Use    Vaping status: Never Used  Substance and Sexual Activity   Alcohol use: Not Currently   Drug use: Yes    Types: Hydrocodone   Sexual activity: Yes    Partners: Male    Birth control/protection: Surgical    Comment: Hysterectomy; First IC @ 13y/o, Partners >5, DES exposure-unknown        "

## 2024-02-06 ENCOUNTER — Other Ambulatory Visit (HOSPITAL_COMMUNITY): Payer: Self-pay

## 2024-02-19 NOTE — Progress Notes (Unsigned)
 "  54 y.o. [id number] Married Caucasian female here for a breast and pelvic exam.    The patient is also followed for ***.  PCP: Chandra Toribio POUR, MD   No LMP recorded. Patient has had a hysterectomy.           Sexually active: Yes.    The current method of family planning is status post hysterectomy.    Menopausal hormone therapy:  Vivelle  & progesterone   Exercising: {yes no:314532}  {types:19826} Smoker:  Former   OB History     Gravida  8   Para  2   Term  2   Preterm      AB  6   Living  2      SAB  6   IAB      Ectopic      Multiple      Live Births  2           HEALTH MAINTENANCE: Last 2 paps: 05/22/21 neg, HR HPV neg  History of abnormal Pap or positive HPV:  no Mammogram:  10/21/23 Breast Density Cat C, BIRADS Cat 1 neg  Colonoscopy:  06/26/22 Bone Density:  02/20/23  Result  normal    Immunization History  Administered Date(s) Administered   Hep A / Hep B 08/13/2021   Influenza, Seasonal, Injecte, Preservative Fre 10/15/2022, 09/23/2023   Influenza,inj,Quad PF,6+ Mos 11/01/2014, 11/23/2020, 10/11/2021   Moderna Sars-Covid-2 Vaccination 05/31/2019, 06/28/2019, 01/14/2020   Pneumococcal Conjugate-13 04/28/2014      reports that she quit smoking about 19 years ago. Her smoking use included cigarettes. She started smoking about 29 years ago. She has a 10 pack-year smoking history. She has been exposed to tobacco smoke. She has never used smokeless tobacco. She reports that she does not currently use alcohol. She reports current drug use. Drug: Hydrocodone.  Past Medical History:  Diagnosis Date   Abnormal uterine bleeding    Anemia    Blood transfusion without reported diagnosis    Complication of anesthesia    can wake up during procedure   Diabetes mellitus without complication (HCC)    Dysmenorrhea    Endometriosis    Fibroid    Genital warts    GERD (gastroesophageal reflux disease)    Hypertension    PONV (postoperative nausea and  vomiting)    RA (rheumatoid arthritis) (HCC)    Radiculopathy     Past Surgical History:  Procedure Laterality Date   ABDOMINAL HYSTERECTOMY     supracervical hysterectomy   CARPOMETACARPEL SUSPENSION PLASTY Left 02/13/2023   Procedure: Left trapeziectomy with ligament reconstruction and tendon interposition;  Surgeon: Romona Harari, MD;  Location: [facility] OR;  Service: Orthopedics;  Laterality: Left;  regional, he can start earlier if need be to fit   CLOSED REDUCTION FINGER WITH PERCUTANEOUS PINNING Left 02/13/2023   Procedure: possible percutaneous pinning of thumb MCP join;  Surgeon: Romona Harari, MD;  Location: [facility] OR;  Service: Orthopedics;  Laterality: Left;  regional he can start earlier if need be to fit   COLONOSCOPY WITH PROPOFOL  N/A 06/26/2022   Procedure: COLONOSCOPY WITH PROPOFOL ;  Surgeon: Therisa Bi, MD;  Location: [facility] ENDOSCOPY;  Service: Gastroenterology;  Laterality: N/A;  Patient informed the office that she wakes from anesthesia combative and pulliing at tubes.  She wanted us  to be aware.   ESOPHAGOGASTRODUODENOSCOPY (EGD) WITH PROPOFOL  N/A 06/26/2022   Procedure: ESOPHAGOGASTRODUODENOSCOPY (EGD) WITH PROPOFOL ;  Surgeon: Therisa Bi, MD;  Location: [facility] ENDOSCOPY;  Service: Gastroenterology;  Laterality:  N/A;   PARATHYROIDECTOMY      Current Outpatient Medications  Medication Sig Dispense Refill   acetaminophen  (TYLENOL ) 325 MG tablet Take 975 mg by mouth every 6 (six) hours as needed for moderate pain (pain score 4-6).     amLODipine  (NORVASC ) 10 MG tablet TAKE 1 TABLET BY MOUTH DAILY 90 tablet 3   atorvastatin  (LIPITOR) 10 MG tablet TAKE 1 TABLET(10 MG) BY MOUTH DAILY 90 tablet 3   benzonatate  (TESSALON ) 200 MG capsule Take 1 capsule (200 mg total) by mouth 2 (two) times daily as needed for cough. 20 capsule 0   Blood Pressure Monitor DEVI 1 Device by Does not apply route daily. 1 each 0   clobetasol  cream (TEMOVATE ) 0.05 % Apply 1 application. topically 2 (two)  times daily. 60 g 3   clotrimazole  (LOTRIMIN ) 1 % cream Apply 1 Application topically 2 (two) times daily. 60 g 1   Cranberry 1000 MG CAPS Take 1,000 mg by mouth daily.     Cyanocobalamin  (B-12 PO) Take 1 capsule by mouth daily.     diclofenac  (VOLTAREN ) 75 MG EC tablet Take 1 tablet (75 mg total) by mouth 2 (two) times daily. 60 tablet 2   esomeprazole  (NEXIUM ) 40 MG capsule TAKE 1 CAPSULE(40 MG) BY MOUTH DAILY 90 capsule 1   estradiol  (VIVELLE -DOT) 0.0375 MG/24HR APPLY 1 PATCH TOPICALLY TO THE SKIN 2 TIMES A WEEK 24 patch 1   fenofibrate  (TRICOR ) 145 MG tablet TAKE 1 TABLET BY MOUTH EVERY DAY WITH DINNER 90 tablet 3   hydrochlorothiazide  (HYDRODIURIL ) 12.5 MG tablet TAKE 1 TABLET(12.5 MG) BY MOUTH DAILY 90 tablet 3   HYDROcodone-acetaminophen  (NORCO/VICODIN) 5-325 MG tablet Take 1 tablet by mouth every 8 (eight) hours as needed.     Lidocaine  (BLUE-EMU PAIN RELIEF DRY EX) Apply 1 Application topically daily as needed (pain).     losartan  (COZAAR ) 100 MG tablet TAKE 1 TABLET(100 MG) BY MOUTH DAILY 90 tablet 1   metoprolol  succinate (TOPROL -XL) 25 MG 24 hr tablet Take 0.5 tablets (12.5 mg total) by mouth daily. 45 tablet 3   Multiple Vitamin (MULTIVITAMIN PO) Take by mouth.     mupirocin  ointment (BACTROBAN ) 2 % Apply 1 Application topically 2 (two) times daily. 30 g 1   nystatin  cream (MYCOSTATIN ) Apply 1 Application topically 2 (two) times daily. 30 g 0   pregabalin (LYRICA) 225 MG capsule Take 225 mg by mouth 3 (three) times daily.     progesterone  (PROMETRIUM ) 100 MG capsule Take 1 capsule (100 mg total) by mouth daily. 90 capsule 1   Secukinumab  (COSENTYX  UNOREADY) 300 MG/2ML SOAJ Inject 300 mg into the skin every 28 (twenty-eight) days. **ALL REFILLS NEED TO BE ESCRIBED to [facility] PT ASSISTANCE** 6 mL 0   tirzepatide  (MOUNJARO ) 2.5 MG/0.5ML Pen Inject 2.5 mg into the skin once a week. 2 mL 0   tiZANidine (ZANAFLEX) 2 MG tablet Take 2 mg by mouth every 6 (six) hours as needed for muscle  spasms (takes only as needed).     No current facility-administered medications for this visit.    ALLERGIES: Alcohol, Dilantin [phenytoin], and Methotrexate  Family History  Problem Relation Age of Onset   Obesity Mother    Coronary artery disease Mother    Bipolar disorder Mother    Multiple sclerosis Mother    Hypertension Father    Heart attack Father    Vascular Disease Father    Breast cancer Maternal Grandmother 33    Review of Systems  PHYSICAL EXAM:  There were no vitals taken for this visit.    General appearance: alert, cooperative and appears stated age Head: normocephalic, without obvious abnormality, atraumatic Neck: no adenopathy, supple, symmetrical, trachea midline and thyroid  normal to inspection and palpation Lungs: clear to auscultation bilaterally Breasts: normal appearance, no masses or tenderness, No nipple retraction or dimpling, No nipple discharge or bleeding, No axillary adenopathy Heart: regular rate and rhythm Abdomen: soft, non-tender; no masses, no organomegaly Extremities: extremities normal, atraumatic, no cyanosis or edema Skin: skin color, texture, turgor normal. No rashes or lesions Lymph nodes: cervical, supraclavicular, and axillary nodes normal. Neurologic: grossly normal  Pelvic: External genitalia:  no lesions              No abnormal inguinal nodes palpated.              Urethra:  normal appearing urethra with no masses, tenderness or lesions              Bartholins and Skenes: normal                 Vagina: normal appearing vagina with normal color and discharge, no lesions              Cervix: no lesions              Pap taken: {yes no:314532} Bimanual Exam:  Uterus:  normal size, contour, position, consistency, mobility, non-tender              Adnexa: no mass, fullness, tenderness              Rectal exam: {yes no:314532}.  Confirms.              Anus:  normal sphincter tone, no lesions  Chaperone was present for exam:   {BSCHAPERONE:31226::Emily F, CMA}  ASSESSMENT: Encounter for breast and pelvic exam.   ***  PLAN: Mammogram screening discussed. Self breast awareness reviewed. Pap and HRV collected:  {yes no:314532} Guidelines for Calcium , Vitamin D , regular exercise program including cardiovascular and weight bearing exercise. Medication refills:  *** {LABS (Optional):23779} Follow up:  ***    Additional counseling given.  {yes [id number]. ***  total time was spent for this patient encounter, including preparation, face-to-face counseling with the patient, coordination of care, and documentation of the encounter in addition to doing the breast and pelvic exam.        "

## 2024-02-23 ENCOUNTER — Ambulatory Visit: Admitting: Obstetrics and Gynecology

## 2024-03-01 ENCOUNTER — Ambulatory Visit: Admitting: Physician Assistant

## 2024-03-31 ENCOUNTER — Ambulatory Visit: Admitting: Internal Medicine

## 2024-05-13 ENCOUNTER — Ambulatory Visit: Admitting: Family Medicine

## 2024-08-19 ENCOUNTER — Ambulatory Visit
